# Patient Record
Sex: Female | Born: 1935
Health system: Southern US, Community
[De-identification: ages and names within clinical notes are randomized; demographics above are authoritative.]

## PROBLEM LIST (undated history)

## (undated) DIAGNOSIS — M1990 Unspecified osteoarthritis, unspecified site: Secondary | ICD-10-CM

## (undated) DIAGNOSIS — N8110 Cystocele, unspecified: Secondary | ICD-10-CM

## (undated) DIAGNOSIS — R911 Solitary pulmonary nodule: Secondary | ICD-10-CM

## (undated) DIAGNOSIS — M810 Age-related osteoporosis without current pathological fracture: Secondary | ICD-10-CM

## (undated) DIAGNOSIS — G609 Hereditary and idiopathic neuropathy, unspecified: Secondary | ICD-10-CM

## (undated) DIAGNOSIS — R159 Full incontinence of feces: Secondary | ICD-10-CM

## (undated) DIAGNOSIS — I10 Essential (primary) hypertension: Secondary | ICD-10-CM

## (undated) DIAGNOSIS — I341 Nonrheumatic mitral (valve) prolapse: Secondary | ICD-10-CM

## (undated) HISTORY — DX: Cystocele, unspecified: N81.10

## (undated) HISTORY — DX: Age-related osteoporosis without current pathological fracture: M81.0

## (undated) HISTORY — DX: Hereditary and idiopathic neuropathy, unspecified: G60.9

## (undated) HISTORY — DX: Solitary pulmonary nodule: R91.1

## (undated) HISTORY — PX: APPENDECTOMY: SHX54

## (undated) HISTORY — PX: CHOLECYSTECTOMY: SHX55

## (undated) HISTORY — DX: Full incontinence of feces: R15.9

## (undated) HISTORY — PX: ABDOMINAL HYSTERECTOMY: SHX81

## (undated) HISTORY — PX: HERNIA REPAIR: SHX51

---

## 1972-08-09 HISTORY — PX: ABDOMINAL HYSTERECTOMY: SHX81

## 1990-08-09 HISTORY — PX: VAGINAL PROLAPSE REPAIR: SHX830

## 1993-08-09 HISTORY — PX: VAGINAL PROLAPSE REPAIR: SHX830

## 1993-08-09 HISTORY — PX: CHOLECYSTECTOMY: SHX55

## 1994-08-09 HISTORY — PX: HERNIA REPAIR: SHX51

## 1998-07-15 ENCOUNTER — Encounter: Payer: Self-pay | Admitting: Orthopedic Surgery

## 1998-07-22 ENCOUNTER — Ambulatory Visit (HOSPITAL_COMMUNITY): Admission: RE | Admit: 1998-07-22 | Discharge: 1998-07-22 | Payer: Self-pay | Admitting: Orthopedic Surgery

## 1999-11-10 ENCOUNTER — Encounter: Admission: RE | Admit: 1999-11-10 | Discharge: 1999-11-10 | Payer: Self-pay | Admitting: Family Medicine

## 1999-11-10 ENCOUNTER — Encounter: Payer: Self-pay | Admitting: Family Medicine

## 1999-11-12 ENCOUNTER — Ambulatory Visit (HOSPITAL_COMMUNITY): Admission: RE | Admit: 1999-11-12 | Discharge: 1999-11-12 | Payer: Self-pay | Admitting: Family Medicine

## 2001-03-03 ENCOUNTER — Encounter: Admission: RE | Admit: 2001-03-03 | Discharge: 2001-03-03 | Payer: Self-pay | Admitting: Family Medicine

## 2001-03-03 ENCOUNTER — Encounter: Payer: Self-pay | Admitting: Family Medicine

## 2003-10-31 ENCOUNTER — Encounter: Admission: RE | Admit: 2003-10-31 | Discharge: 2003-10-31 | Payer: Self-pay | Admitting: Family Medicine

## 2006-02-21 ENCOUNTER — Ambulatory Visit: Payer: Self-pay | Admitting: Internal Medicine

## 2006-03-15 ENCOUNTER — Encounter: Payer: Self-pay | Admitting: Internal Medicine

## 2006-03-15 ENCOUNTER — Ambulatory Visit: Payer: Self-pay | Admitting: Internal Medicine

## 2006-08-18 ENCOUNTER — Encounter: Admission: RE | Admit: 2006-08-18 | Discharge: 2006-08-18 | Payer: Self-pay | Admitting: Orthopedic Surgery

## 2006-08-23 ENCOUNTER — Ambulatory Visit (HOSPITAL_BASED_OUTPATIENT_CLINIC_OR_DEPARTMENT_OTHER): Admission: RE | Admit: 2006-08-23 | Discharge: 2006-08-23 | Payer: Self-pay | Admitting: Orthopedic Surgery

## 2007-02-25 ENCOUNTER — Encounter: Admission: RE | Admit: 2007-02-25 | Discharge: 2007-02-25 | Payer: Self-pay | Admitting: Family Medicine

## 2008-06-12 ENCOUNTER — Emergency Department (HOSPITAL_COMMUNITY): Admission: EM | Admit: 2008-06-12 | Discharge: 2008-06-12 | Payer: Self-pay | Admitting: Emergency Medicine

## 2009-05-16 ENCOUNTER — Encounter: Admission: RE | Admit: 2009-05-16 | Discharge: 2009-05-16 | Payer: Self-pay | Admitting: Family Medicine

## 2010-03-24 ENCOUNTER — Encounter: Admission: RE | Admit: 2010-03-24 | Discharge: 2010-03-24 | Payer: Self-pay | Admitting: Gastroenterology

## 2010-12-25 NOTE — Op Note (Signed)
Sandra Mclaughlin, HENNER                  ACCOUNT NO.:  [id number]   MEDICAL RECORD NO.:  [id number]          PATIENT TYPE:  AMB   LOCATION:  DSC                          FACILITY:  [facility]   PHYSICIAN:  Leonides Grills, M.D.     DATE OF BIRTH:  1930/12/09   DATE OF PROCEDURE:  08/23/2006  DATE OF DISCHARGE:                               OPERATIVE REPORT   PREOPERATIVE DIAGNOSIS:  Right hallux valgus.   POSTOPERATIVE DIAGNOSIS:  Right hallux valgus.   OPERATIONS:  1. Right chevron bunionectomy.  2. Stress x-rays right foot.   ANESTHESIA:  General.   SURGEON:  Leonides Grills, MD   ASSISTANT:  Evlyn Kanner, P.A.   ESTIMATED BLOOD LOSS:  Minimal.   TOURNIQUET TIME:  Approximately 40 minutes.   COMPLICATIONS:  None.   DISPOSITION:  Stable to PR.   INDICATIONS:  This is a 77 year old female who has had longstanding  bunion pain that is interfering with her life to the point she cannot do  what she wants to do.  She has consented for the above procedure.  All  risks which infection neurovessel injury, nonunion, malunion, hardware  irritation, hardware failure, stiffness, arthritis, persistent or worse  pain, recurrence of deformity, hallux varus were all explained.  Questions were encouraged and answered.   OPERATION:  Patient brought to the operating room, placed in supine  position after adequate general endotracheal tube anesthesia was  administered as well as Ancef 1 gram IV piggyback.  The right lower  extremity was then prepped and draped in a sterile manner over  proximally placed thigh tourniquet.  The limb was gravity exsanguinated  and tourniquet was elevated to 290 mmHg.  A longitudinal incision over  the medial aspect great toe MTP joint was then made.  Dissection was  carried down through skin.  Hemostasis was obtained.  Neurovascular  structures were identified both superiorly and inferiorly and protected  throughout the case.  L-shaped capsulotomy was then made.   Simple  bunionectomy was performed with a sagittal saw.  Lateral capsule was  then released with a curved Beaver blade.  This had an excellent release  of tight lateral capsule.  Alona Bene ridge was then rounded off with a  rongeur.  Center head was then identified and Chevron osteotomy was  created with a sagittal saw.  Head was then translated approximately 3  or 4 mm laterally and fixed with 2.0 mm fully threaded cortical set  screw using 1.5-mm drill hole respectively.  This was countersunk.  This  had excellent purchase and maintenance of the correction.  The redundant  bone medially was then trimmed off with the sagittal saw.  Joint and  area were copiously irrigated with normal saline.  The capsule was  reconstructed and advanced both superiorly and proximally and  reconstructed with 2-0 Vicryl stitch.  This had an Conservation officer, historic buildings.  We were able to relocate the sesamoids beautifully.  Final stress x-rays  were obtained AP and lateral planes and showed sesamoids located,  fixation in proper position, no gross motion across the osteotomy site  and correction excellent as well.  Tourniquet deflated.  Hemostasis was  obtained.  The area was copiously irrigated with normal saline.  Skin  was closed with 4-0 nylon sutures.  Sterile dressing was applied.  Roger  Mann dressing was applied.  Hard sole shoe was applied.  The patient was  stable to PR.      Leonides Grills, M.D.  Electronically Signed     PB/MEDQ  D:  08/23/2006  T:  08/23/2006  Job:  [id number]

## 2011-05-11 LAB — URINALYSIS, ROUTINE W REFLEX MICROSCOPIC
Bilirubin Urine: NEGATIVE
Glucose, UA: NEGATIVE
Hgb urine dipstick: NEGATIVE
Ketones, ur: NEGATIVE
Nitrite: NEGATIVE
Protein, ur: NEGATIVE
Specific Gravity, Urine: 1.011
Urobilinogen, UA: 0.2
pH: 6.5

## 2011-05-11 LAB — DIFFERENTIAL
Basophils Absolute: 0
Basophils Relative: 0
Eosinophils Absolute: 0
Eosinophils Relative: 0
Lymphocytes Relative: 17
Lymphs Abs: 1.4
Monocytes Absolute: 0.4
Monocytes Relative: 5
Neutro Abs: 6.4
Neutrophils Relative %: 78 — ABNORMAL HIGH

## 2011-05-11 LAB — URINE MICROSCOPIC-ADD ON

## 2011-05-11 LAB — CBC
HCT: 40.6
Hemoglobin: 13.7
MCHC: 33.8
MCV: 91.4
Platelets: 302
RBC: 4.44
RDW: 13.6
WBC: 8.3

## 2011-05-11 LAB — POCT CARDIAC MARKERS
CKMB, poc: 1.2
Troponin i, poc: <0.05

## 2011-05-11 LAB — COMPREHENSIVE METABOLIC PANEL
AST: 18
Albumin: 3.9
Alkaline Phosphatase: 53
CO2: 24
GFR calc Af Amer: >60
Potassium: 3.3 — ABNORMAL LOW
Sodium: 138
Total Bilirubin: 0.8

## 2011-05-11 LAB — COMPREHENSIVE METABOLIC PANEL WITH GFR
ALT: 16
BUN: 10
Calcium: 8.9
Chloride: 106
Creatinine, Ser: 0.74
GFR calc non Af Amer: >60
Glucose, Bld: 112 — ABNORMAL HIGH
Total Protein: 6.9

## 2011-05-11 LAB — LIPASE, BLOOD: Lipase: 23

## 2011-05-11 LAB — D-DIMER, QUANTITATIVE: D-Dimer, Quant: 0.24

## 2011-11-22 ENCOUNTER — Other Ambulatory Visit: Payer: Self-pay | Admitting: Physician Assistant

## 2011-11-25 ENCOUNTER — Other Ambulatory Visit: Payer: Self-pay | Admitting: Family Medicine

## 2011-11-25 ENCOUNTER — Ambulatory Visit
Admission: RE | Admit: 2011-11-25 | Discharge: 2011-11-25 | Disposition: A | Discharge status: Home / Self Care | Payer: Medicare Other | Source: Ambulatory Visit | Attending: Family Medicine | Admitting: Family Medicine

## 2011-11-25 DIAGNOSIS — M79673 Pain in unspecified foot: Secondary | ICD-10-CM

## 2012-02-15 ENCOUNTER — Other Ambulatory Visit: Payer: Self-pay | Admitting: Family Medicine

## 2012-04-28 ENCOUNTER — Other Ambulatory Visit: Payer: Self-pay | Admitting: Oncology

## 2013-03-16 ENCOUNTER — Other Ambulatory Visit: Payer: Self-pay | Admitting: Family Medicine

## 2013-03-16 DIAGNOSIS — R109 Unspecified abdominal pain: Secondary | ICD-10-CM

## 2013-03-19 ENCOUNTER — Ambulatory Visit
Admission: RE | Admit: 2013-03-19 | Discharge: 2013-03-19 | Disposition: A | Discharge status: Home / Self Care | Payer: Medicare Other | Source: Ambulatory Visit | Attending: Family Medicine | Admitting: Family Medicine

## 2013-03-19 DIAGNOSIS — R109 Unspecified abdominal pain: Secondary | ICD-10-CM

## 2013-03-19 MED ORDER — IOHEXOL 300 MG/ML  SOLN
100.0000 mL | Freq: Once | INTRAMUSCULAR | Status: AC | PRN
  Administered 2013-03-19: 100 mL via INTRAVENOUS

## 2013-03-23 ENCOUNTER — Other Ambulatory Visit: Payer: Self-pay | Admitting: Gastroenterology

## 2013-07-23 ENCOUNTER — Other Ambulatory Visit: Payer: Self-pay | Admitting: Gastroenterology

## 2013-08-09 DIAGNOSIS — Z9889 Other specified postprocedural states: Secondary | ICD-10-CM

## 2013-08-09 HISTORY — DX: Other specified postprocedural states: Z98.890

## 2014-08-20 ENCOUNTER — Other Ambulatory Visit: Payer: Self-pay | Admitting: Family Medicine

## 2014-08-20 DIAGNOSIS — R1032 Left lower quadrant pain: Secondary | ICD-10-CM

## 2014-08-22 ENCOUNTER — Ambulatory Visit
Admission: RE | Admit: 2014-08-22 | Discharge: 2014-08-22 | Disposition: A | Discharge status: Home / Self Care | Payer: Medicare Other | Source: Ambulatory Visit | Attending: Family Medicine | Admitting: Family Medicine

## 2014-08-22 DIAGNOSIS — R1032 Left lower quadrant pain: Secondary | ICD-10-CM

## 2014-08-22 MED ORDER — IOHEXOL 300 MG/ML  SOLN
100.0000 mL | Freq: Once | INTRAMUSCULAR | Status: AC | PRN
  Administered 2014-08-22: 100 mL via INTRAVENOUS

## 2014-09-02 ENCOUNTER — Encounter (HOSPITAL_COMMUNITY): Payer: Self-pay | Admitting: Emergency Medicine

## 2014-09-02 ENCOUNTER — Emergency Department (HOSPITAL_COMMUNITY)
Admission: EM | Admit: 2014-09-02 | Discharge: 2014-09-02 | Disposition: A | Discharge status: Home / Self Care | Payer: Medicare Other | Attending: Emergency Medicine | Admitting: Emergency Medicine

## 2014-09-02 ENCOUNTER — Emergency Department (HOSPITAL_COMMUNITY): Payer: Medicare Other

## 2014-09-02 DIAGNOSIS — N390 Urinary tract infection, site not specified: Secondary | ICD-10-CM | POA: Diagnosis not present

## 2014-09-02 DIAGNOSIS — Z79899 Other long term (current) drug therapy: Secondary | ICD-10-CM | POA: Diagnosis not present

## 2014-09-02 DIAGNOSIS — M549 Dorsalgia, unspecified: Secondary | ICD-10-CM

## 2014-09-02 DIAGNOSIS — M545 Low back pain: Secondary | ICD-10-CM | POA: Diagnosis present

## 2014-09-02 DIAGNOSIS — Z792 Long term (current) use of antibiotics: Secondary | ICD-10-CM | POA: Diagnosis not present

## 2014-09-02 DIAGNOSIS — R52 Pain, unspecified: Secondary | ICD-10-CM

## 2014-09-02 LAB — CBC WITH DIFFERENTIAL/PLATELET
BASOS PCT: 0 % (ref 0–1)
Basophils Absolute: 0 10*3/uL (ref 0.0–0.1)
EOS ABS: 0 10*3/uL (ref 0.0–0.7)
Eosinophils Relative: 0 % (ref 0–5)
HCT: 40.1 % (ref 36.0–46.0)
Hemoglobin: 13.4 g/dL (ref 12.0–15.0)
LYMPHS PCT: 8 % — AB (ref 12–46)
Lymphs Abs: 1.2 10*3/uL (ref 0.7–4.0)
MCH: 30.5 pg (ref 26.0–34.0)
MCHC: 33.4 g/dL (ref 30.0–36.0)
MCV: 91.3 fL (ref 78.0–100.0)
Monocytes Absolute: 1.2 10*3/uL — ABNORMAL HIGH (ref 0.1–1.0)
Monocytes Relative: 7 % (ref 3–12)
NEUTROS ABS: 13.8 10*3/uL — AB (ref 1.7–7.7)
Neutrophils Relative %: 85 % — ABNORMAL HIGH (ref 43–77)
Platelets: 244 10*3/uL (ref 150–400)
RBC: 4.39 MIL/uL (ref 3.87–5.11)
RDW: 13.6 % (ref 11.5–15.5)
WBC: 16.3 10*3/uL — AB (ref 4.0–10.5)

## 2014-09-02 LAB — COMPREHENSIVE METABOLIC PANEL
ALT: 24 U/L (ref 0–35)
ANION GAP: 9 (ref 5–15)
AST: 27 U/L (ref 0–37)
Albumin: 3.8 g/dL (ref 3.5–5.2)
Alkaline Phosphatase: 48 U/L (ref 39–117)
BILIRUBIN TOTAL: 0.7 mg/dL (ref 0.3–1.2)
BUN: 12 mg/dL (ref 6–23)
CALCIUM: 8.7 mg/dL (ref 8.4–10.5)
CHLORIDE: 106 mmol/L (ref 96–112)
CO2: 24 mmol/L (ref 19–32)
CREATININE: 0.69 mg/dL (ref 0.50–1.10)
GFR calc Af Amer: >90 mL/min (ref >90)
GFR calc non Af Amer: 79 mL/min — ABNORMAL LOW (ref >90)
Glucose, Bld: 133 mg/dL — ABNORMAL HIGH (ref 70–99)
POTASSIUM: 3.3 mmol/L — AB (ref 3.5–5.1)
SODIUM: 139 mmol/L (ref 135–145)
Total Protein: 6.9 g/dL (ref 6.0–8.3)

## 2014-09-02 LAB — URINE MICROSCOPIC-ADD ON

## 2014-09-02 LAB — URINALYSIS, ROUTINE W REFLEX MICROSCOPIC
Bilirubin Urine: NEGATIVE
GLUCOSE, UA: NEGATIVE mg/dL
HGB URINE DIPSTICK: NEGATIVE
Ketones, ur: NEGATIVE mg/dL
NITRITE: NEGATIVE
PROTEIN: NEGATIVE mg/dL
Specific Gravity, Urine: 1.016 (ref 1.005–1.030)
UROBILINOGEN UA: 0.2 mg/dL (ref 0.0–1.0)
pH: 6 (ref 5.0–8.0)

## 2014-09-02 LAB — LIPASE, BLOOD: Lipase: 43 U/L (ref 11–59)

## 2014-09-02 MED ORDER — CEFUROXIME AXETIL 250 MG PO TABS: 250.0000 mg | ORAL_TABLET | Freq: Two times a day (BID) | ORAL | Status: DC

## 2014-09-02 MED ORDER — MORPHINE SULFATE 4 MG/ML IJ SOLN
4.0000 mg | Freq: Once | INTRAMUSCULAR | Status: AC
  Administered 2014-09-02: 4 mg via INTRAVENOUS
  Filled 2014-09-02: qty 1

## 2014-09-02 MED ORDER — HYDROCODONE-ACETAMINOPHEN 5-325 MG PO TABS: 1.0000 | ORAL_TABLET | ORAL | Status: DC | PRN

## 2014-09-02 MED ORDER — SODIUM CHLORIDE 0.9 % IV SOLN
1000.0000 mL | INTRAVENOUS | Status: DC
  Administered 2014-09-02: 1000 mL via INTRAVENOUS

## 2014-09-02 MED ORDER — DEXTROSE 5 % IV SOLN
1.0000 g | Freq: Once | INTRAVENOUS | Status: AC
  Administered 2014-09-02: 1 g via INTRAVENOUS
  Filled 2014-09-02: qty 10

## 2014-09-02 MED ORDER — ONDANSETRON HCL 4 MG/2ML IJ SOLN
4.0000 mg | Freq: Once | INTRAMUSCULAR | Status: AC
  Administered 2014-09-02: 4 mg via INTRAVENOUS
  Filled 2014-09-02: qty 2

## 2014-09-02 NOTE — ED Provider Notes (Signed)
CSN: [id number]     Arrival date & time 09/02/14  1219 History   First MD Initiated Contact with Patient 09/02/14 1221     Chief Complaint  Patient presents with  . Back Pain  . right side pain     HPI Comments: She woke up yesterday with pain in her right back.  It seems to be moving towards the right lower back and right hip.  Patient is a 81 y.o. female presenting with back pain. The history is provided by the patient.  Back Pain Location:  Lumbar spine Quality:  Stabbing Pain severity:  Severe Duration:  1 day Timing:  Constant Progression:  Worsening Chronicity:  New Context: not recent illness and not recent injury   Worsened by:  Twisting, ambulation and movement Associated symptoms: no abdominal pain, no chest pain, no dysuria, no fever and no numbness   SHe was treated for a uti about a week ago.  That seems to have improved.  No history of prior back troubles.  No numbness or weakness in her legs  History reviewed. No pertinent past medical history. Past Surgical History  Procedure Laterality Date  . Cholecystectomy    . Appendectomy    . Abdominal hysterectomy     No family history on file. History  Substance Use Topics  . Smoking status: Never Smoker   . Smokeless tobacco: Not on file  . Alcohol Use: No   OB History    No data available     Review of Systems  Constitutional: Negative for fever.  Cardiovascular: Negative for chest pain.  Gastrointestinal: Negative for abdominal pain.  Genitourinary: Negative for dysuria.  Musculoskeletal: Positive for back pain.  Neurological: Negative for numbness.  All other systems reviewed and are negative.     Allergies  Review of patient's allergies indicates no known allergies.  Home Medications   Prior to Admission medications   Medication Sig Start Date End Date Taking? Authorizing Provider  alendronate (FOSAMAX) 70 MG tablet Take 70 mg by mouth once a week. tuesday 06/13/14  Yes Historical Provider, MD    glucosamine-chondroitin 500-400 MG tablet Take 1 tablet by mouth 2 (two) times daily.   Yes Historical Provider, MD  hydrochlorothiazide (HYDRODIURIL) 25 MG tablet Take 25 mg by mouth daily. 06/13/14  Yes Historical Provider, MD  hyoscyamine (LEVSIN, ANASPAZ) 0.125 MG tablet Take 1 tablet by mouth every 4 (four) hours as needed. Abdominal pain 08/15/14  Yes Historical Provider, MD  KLOR-CON M10 10 MEQ tablet Take 1 tablet by mouth daily. 06/13/14  Yes Historical Provider, MD  Multiple Vitamins-Minerals (CENTRUM SILVER PO) Take 1 tablet by mouth daily.   Yes Historical Provider, MD  timolol (TIMOPTIC-XR) 0.5 % ophthalmic gel-forming Place 1 drop into both eyes every morning. 08/20/14  Yes Historical Provider, MD  cefUROXime (CEFTIN) 250 MG tablet Take 1 tablet (250 mg total) by mouth 2 (two) times daily with a meal. 09/02/14   Linwood Dibbles, MD  ciprofloxacin (CIPRO) 500 MG tablet Take 1 tablet by mouth 2 (two) times daily. 08/20/14   Historical Provider, MD  HYDROcodone-acetaminophen (NORCO/VICODIN) 5-325 MG per tablet Take 1-2 tablets by mouth every 4 (four) hours as needed. 09/02/14   Linwood Dibbles, MD   BP 124/87 mmHg  Pulse 102  Temp(Src) 98.2 F (36.8 C) (Oral)  Resp 20  Ht  (1.575 m)  Wt 134 lb (60.782 kg)  BMI 24.50 kg/m2  SpO2 96% Physical Exam  Constitutional: She appears well-developed and well-nourished. No  distress.  HENT:  Head: Normocephalic and atraumatic.  Right Ear: External ear normal.  Left Ear: External ear normal.  Eyes: Conjunctivae are normal. Right eye exhibits no discharge. Left eye exhibits no discharge. No scleral icterus.  Neck: Neck supple. No tracheal deviation present.  Cardiovascular: Normal rate, regular rhythm and intact distal pulses.   Pulmonary/Chest: Effort normal and breath sounds normal. No stridor. No respiratory distress. She has no wheezes. She has no rales.  Abdominal: Soft. Bowel sounds are normal. She exhibits no distension. There is no tenderness.  There is no rebound and no guarding.  Musculoskeletal: She exhibits no edema.       Lumbar back: She exhibits tenderness.       Back:  Neurological: She is alert. She has normal strength. No cranial nerve deficit (no facial droop, extraocular movements intact, no slurred speech) or sensory deficit. She exhibits normal muscle tone. She displays no seizure activity. Coordination normal.  Skin: Skin is warm and dry. No rash noted.  Psychiatric: She has a normal mood and affect.  Nursing note and vitals reviewed.   ED Course  Procedures (including critical care time) Labs Review Labs Reviewed  CBC WITH DIFFERENTIAL/PLATELET - Abnormal; Notable for the following:    WBC 16.3 (*)    Neutrophils Relative % 85 (*)    Neutro Abs 13.8 (*)    Lymphocytes Relative 8 (*)    Monocytes Absolute 1.2 (*)    All other components within normal limits  COMPREHENSIVE METABOLIC PANEL - Abnormal; Notable for the following:    Potassium 3.3 (*)    Glucose, Bld 133 (*)    GFR calc non Af Amer 79 (*)    All other components within normal limits  URINALYSIS, ROUTINE W REFLEX MICROSCOPIC - Abnormal; Notable for the following:    APPearance CLOUDY (*)    Leukocytes, UA LARGE (*)    All other components within normal limits  URINE MICROSCOPIC-ADD ON - Abnormal; Notable for the following:    Bacteria, UA MANY (*)    All other components within normal limits  URINE CULTURE  LIPASE, BLOOD    Imaging Review Dg Chest 2 View  09/02/2014   CLINICAL DATA:  RIGHT-sided chest pain with inspiration. Symptom onset yesterday. Initial encounter.  EXAM: CHEST  2 VIEW  COMPARISON:  08/18/2006 chest radiograph.  FINDINGS: Chronic bronchitic changes are present at the RIGHT lung base. Aortic arch atherosclerosis. Cholecystectomy clips are present in the right upper quadrant.  The cardiopericardial silhouette is within normal limits. Thoracic kyphosis and thoracic spondylosis.  IMPRESSION: No active cardiopulmonary disease.    Electronically Signed   By: Andreas NewportGeoffrey  Lamke M.D.   On: 09/02/2014 14:05   Dg Lumbar Spine Complete  09/02/2014   CLINICAL DATA:  Right-sided low back pain on awakening today. No acute injury. Initial encounter.  EXAM: LUMBAR SPINE - COMPLETE 4+ VIEW  COMPARISON:  Abdominal pelvic CT 08/22/2014. Abdominal radiographs 10/26/2010.  FINDINGS: There are 5 lumbar type vertebral bodies. There is a stable mild convex right scoliosis. No fracture or pars defect is evident. The lumbar disc spaces are relatively preserved. There is multilevel facet disease. Scattered vascular calcifications/ phleboliths again noted.  IMPRESSION: Stable lumbar spondylosis and convex right scoliosis. No acute osseous findings.   Electronically Signed   By: Roxy HorsemanBill  Veazey M.D.   On: 09/02/2014 14:05    Medications  0.9 %  sodium chloride infusion (1,000 mLs Intravenous New Bag/Given 09/02/14 1317)  ondansetron (ZOFRAN) injection 4 mg (4  mg Intravenous Given 09/02/14 1317)  morphine 4 MG/ML injection 4 mg (4 mg Intravenous Given 09/02/14 1317)  cefTRIAXone (ROCEPHIN) 1 g in dextrose 5 % 50 mL IVPB (1 g Intravenous New Bag/Given 09/02/14 1449)  morphine 4 MG/ML injection 4 mg (4 mg Intravenous Given 09/02/14 1449)     MDM   Final diagnoses:  Pain  UTI (lower urinary tract infection)  Back pain, unspecified location    Pt does have a uti.  This could be causing some of her pain but a lot of it is associated with movement and is positional.  May be musculoskeletal as well.  No abdominal ttp.  Doubt AAA.  Dc home with pain med and abx.  Follow up with her PCP   Linwood Dibbles, MD 09/02/14 [phone]

## 2014-09-02 NOTE — Discharge Instructions (Signed)
Back Pain, Adult °Low back pain is very common. About 1 in 5 people have back pain. The cause of low back pain is rarely dangerous. The pain often gets better over time. About half of people with a sudden onset of back pain feel better in just 2 weeks. About 8 in 10 people feel better by 6 weeks.  °CAUSES °Some common causes of back pain include: °· Strain of the muscles or ligaments supporting the spine. °· Wear and tear (degeneration) of the spinal discs. °· Arthritis. °· Direct injury to the back. °DIAGNOSIS °Most of the time, the direct cause of low back pain is not known. However, back pain can be treated effectively even when the exact cause of the pain is unknown. Answering your caregiver's questions about your overall health and symptoms is one of the most accurate ways to make sure the cause of your pain is not dangerous. If your caregiver needs more information, he or she may order lab work or imaging tests (X-rays or MRIs). However, even if imaging tests show changes in your back, this usually does not require surgery. °HOME CARE INSTRUCTIONS °For many people, back pain returns. Since low back pain is rarely dangerous, it is often a condition that people can learn to manage on their own.  °· Remain active. It is stressful on the back to sit or stand in one place. Do not sit, drive, or stand in one place for more than 30 minutes at a time. Take short walks on level surfaces as soon as pain allows. Try to increase the length of time you walk each day. °· Do not stay in bed. Resting more than 1 or 2 days can delay your recovery. °· Do not avoid exercise or work. Your body is made to move. It is not dangerous to be active, even though your back may hurt. Your back will likely heal faster if you return to being active before your pain is gone. °· Pay attention to your body when you  bend and lift. Many people have less discomfort when lifting if they bend their knees, keep the load close to their bodies, and  avoid twisting. Often, the most comfortable positions are those that put less stress on your recovering back. °· Find a comfortable position to sleep. Use a firm mattress and lie on your side with your knees slightly bent. If you lie on your back, put a pillow under your knees. °· Only take over-the-counter or prescription medicines as directed by your caregiver. Over-the-counter medicines to reduce pain and inflammation are often the most helpful. Your caregiver may prescribe muscle relaxant drugs. These medicines help dull your pain so you can more quickly return to your normal activities and healthy exercise. °· Put ice on the injured area. °· Put ice in a plastic bag. °· Place a towel between your skin and the bag. °· Leave the ice on for 15-20 minutes, 03-04 times a day for the first 2 to 3 days. After that, ice and heat may be alternated to reduce pain and spasms. °· Ask your caregiver about trying back exercises and gentle massage. This may be of some benefit. °· Avoid feeling anxious or stressed. Stress increases muscle tension and can worsen back pain. It is important to recognize when you are anxious or stressed and learn ways to manage it. Exercise is a great option. °SEEK MEDICAL CARE IF: °· You have pain that is not relieved with rest or medicine. °· You have pain that does not improve in 1 week. °· You have new symptoms. °· You are generally not feeling well. °SEEK   IMMEDIATE MEDICAL CARE IF:  °· You have pain that radiates from your back into your legs. °· You develop new bowel or bladder control problems. °· You have unusual weakness or numbness in your arms or legs. °· You develop nausea or vomiting. °· You develop abdominal pain. °· You feel faint. °Document Released: 07/26/2005 Document Revised: 01/25/2012 Document Reviewed: 11/27/2013 °ExitCare® Patient Information ©2015 [employer]. This information is not intended to replace advice given to you by your health care provider. Make sure you  discuss any questions you have with your health care provider. ° °Urinary Tract Infection °Urinary tract infections (UTIs) can develop anywhere along your urinary tract. Your urinary tract is your body's drainage system for removing wastes and extra water. Your urinary tract includes two kidneys, two ureters, a bladder, and a urethra. Your kidneys are a pair of bean-shaped organs. Each kidney is about the size of your fist. They are located below your ribs, one on each side of your spine. °CAUSES °Infections are caused by microbes, which are microscopic organisms, including fungi, viruses, and bacteria. These organisms are so small that they can only be seen through a microscope. Bacteria are the microbes that most commonly cause UTIs. °SYMPTOMS  °Symptoms of UTIs may vary by age and gender of the patient and by the location of the infection. Symptoms in young women typically include a frequent and intense urge to urinate and a painful, burning feeling in the bladder or urethra during urination. Older women and men are more likely to be tired, shaky, and weak and have muscle aches and abdominal pain. A fever may mean the infection is in your kidneys. Other symptoms of a kidney infection include pain in your back or sides below the ribs, nausea, and vomiting. °DIAGNOSIS °To diagnose a UTI, your caregiver will ask you about your symptoms. Your caregiver also will ask to provide a urine sample. The urine sample will be tested for bacteria and white blood cells. White blood cells are made by your body to help fight infection. °TREATMENT  °Typically, UTIs can be treated with medication. Because most UTIs are caused by a bacterial infection, they usually can be treated with the use of antibiotics. The choice of antibiotic and length of treatment depend on your symptoms and the type of bacteria causing your infection. °HOME CARE INSTRUCTIONS °· If you were prescribed antibiotics, take them exactly as your caregiver  instructs you. Finish the medication even if you feel better after you have only taken some of the medication. °· Drink enough water and fluids to keep your urine clear or pale yellow. °· Avoid caffeine, tea, and carbonated beverages. They tend to irritate your bladder. °· Empty your bladder often. Avoid holding urine for long periods of time. °· Empty your bladder before and after sexual intercourse. °· After a bowel movement, women should cleanse from front to back. Use each tissue only once. °SEEK MEDICAL CARE IF:  °· You have back pain. °· You develop a fever. °· Your symptoms do not begin to resolve within 3 days. °SEEK IMMEDIATE MEDICAL CARE IF:  °· You have severe back pain or lower abdominal pain. °· You develop chills. °· You have nausea or vomiting. °· You have continued burning or discomfort with urination. °MAKE SURE YOU:  °· Understand these instructions. °· Will watch your condition. °· Will get help right away if you are not doing well or get worse. °Document Released: 05/05/2005 Document Revised: 01/25/2012 Document Reviewed: 09/03/2011 °ExitCare® Patient Information ©  2015 [employer]. This information is not intended to replace advice given to you by your health care provider. Make sure you discuss any questions you have with your health care provider. ° °

## 2014-09-02 NOTE — ED Notes (Signed)
Pt states yesterday when she woke up she felt as if she had slept on right side wrong, woke up with pain on right side espeically with dee[p breathe, pt states this morning she woke up and the pain radiated across back.

## 2014-09-02 NOTE — ED Notes (Signed)
Bed: WA06 Expected date:  Expected time:  Means of arrival:  Comments: EMS/seizure 

## 2014-09-03 LAB — URINE CULTURE
Colony Count: NO GROWTH
Culture: NO GROWTH

## 2015-01-01 ENCOUNTER — Other Ambulatory Visit (HOSPITAL_COMMUNITY): Payer: Self-pay | Admitting: Gastroenterology

## 2015-01-01 DIAGNOSIS — R1013 Epigastric pain: Secondary | ICD-10-CM

## 2015-01-15 ENCOUNTER — Ambulatory Visit (HOSPITAL_COMMUNITY)
Admission: RE | Admit: 2015-01-15 | Discharge: 2015-01-15 | Disposition: A | Discharge status: Home / Self Care | Payer: Medicare Other | Source: Ambulatory Visit | Attending: Gastroenterology | Admitting: Gastroenterology

## 2015-01-15 DIAGNOSIS — R110 Nausea: Secondary | ICD-10-CM | POA: Insufficient documentation

## 2015-01-15 DIAGNOSIS — R1013 Epigastric pain: Secondary | ICD-10-CM | POA: Diagnosis not present

## 2015-01-15 MED ORDER — TECHNETIUM TC 99M SULFUR COLLOID
2.0000 | Freq: Once | INTRAVENOUS | Status: AC | PRN
  Administered 2015-01-15: 2 via INTRAVENOUS

## 2015-08-10 HISTORY — PX: SPINAL CORD STIMULATOR IMPLANT: SHX2422

## 2015-08-16 ENCOUNTER — Emergency Department (HOSPITAL_COMMUNITY)
Admission: EM | Admit: 2015-08-16 | Discharge: 2015-08-16 | Disposition: A | Discharge status: Home / Self Care | Payer: Medicare Other | Attending: Emergency Medicine | Admitting: Emergency Medicine

## 2015-08-16 ENCOUNTER — Encounter (HOSPITAL_COMMUNITY): Payer: Self-pay | Admitting: Emergency Medicine

## 2015-08-16 ENCOUNTER — Emergency Department (HOSPITAL_COMMUNITY): Payer: Medicare Other

## 2015-08-16 DIAGNOSIS — Z792 Long term (current) use of antibiotics: Secondary | ICD-10-CM | POA: Insufficient documentation

## 2015-08-16 DIAGNOSIS — G8929 Other chronic pain: Secondary | ICD-10-CM | POA: Insufficient documentation

## 2015-08-16 DIAGNOSIS — R110 Nausea: Secondary | ICD-10-CM | POA: Insufficient documentation

## 2015-08-16 DIAGNOSIS — R1013 Epigastric pain: Secondary | ICD-10-CM | POA: Diagnosis not present

## 2015-08-16 DIAGNOSIS — R1033 Periumbilical pain: Secondary | ICD-10-CM | POA: Insufficient documentation

## 2015-08-16 DIAGNOSIS — R109 Unspecified abdominal pain: Secondary | ICD-10-CM

## 2015-08-16 DIAGNOSIS — Z79899 Other long term (current) drug therapy: Secondary | ICD-10-CM | POA: Insufficient documentation

## 2015-08-16 LAB — COMPREHENSIVE METABOLIC PANEL
ALBUMIN: 3.1 g/dL — AB (ref 3.5–5.0)
ALT: 37 U/L (ref 14–54)
AST: 23 U/L (ref 15–41)
Alkaline Phosphatase: 60 U/L (ref 38–126)
Anion gap: 12 (ref 5–15)
BUN: 13 mg/dL (ref 6–20)
CALCIUM: 9 mg/dL (ref 8.9–10.3)
CO2: 22 mmol/L (ref 22–32)
CREATININE: 1.12 mg/dL — AB (ref 0.44–1.00)
Chloride: 97 mmol/L — ABNORMAL LOW (ref 101–111)
GFR calc Af Amer: 52 mL/min — ABNORMAL LOW (ref >60)
GFR calc non Af Amer: 44 mL/min — ABNORMAL LOW (ref >60)
GLUCOSE: 109 mg/dL — AB (ref 65–99)
Potassium: 3.4 mmol/L — ABNORMAL LOW (ref 3.5–5.1)
SODIUM: 131 mmol/L — AB (ref 135–145)
Total Bilirubin: <0.1 mg/dL — ABNORMAL LOW (ref 0.3–1.2)
Total Protein: 6.8 g/dL (ref 6.5–8.1)

## 2015-08-16 LAB — CBC WITH DIFFERENTIAL/PLATELET
Basophils Absolute: 0 10*3/uL (ref 0.0–0.1)
Basophils Relative: 0 %
EOS ABS: 0 10*3/uL (ref 0.0–0.7)
Eosinophils Relative: 1 %
HCT: 37.8 % (ref 36.0–46.0)
HEMOGLOBIN: 12.7 g/dL (ref 12.0–15.0)
LYMPHS ABS: 1.3 10*3/uL (ref 0.7–4.0)
Lymphocytes Relative: 18 %
MCH: 29.8 pg (ref 26.0–34.0)
MCHC: 33.6 g/dL (ref 30.0–36.0)
MCV: 88.7 fL (ref 78.0–100.0)
Monocytes Absolute: 0.7 10*3/uL (ref 0.1–1.0)
Monocytes Relative: 10 %
NEUTROS PCT: 71 %
Neutro Abs: 5.3 10*3/uL (ref 1.7–7.7)
Platelets: 405 10*3/uL — ABNORMAL HIGH (ref 150–400)
RBC: 4.26 MIL/uL (ref 3.87–5.11)
RDW: 12.7 % (ref 11.5–15.5)
WBC: 7.3 10*3/uL (ref 4.0–10.5)

## 2015-08-16 LAB — URINALYSIS, ROUTINE W REFLEX MICROSCOPIC
BILIRUBIN URINE: NEGATIVE
GLUCOSE, UA: NEGATIVE mg/dL
Hgb urine dipstick: NEGATIVE
KETONES UR: 15 mg/dL — AB
Nitrite: NEGATIVE
PH: 6 (ref 5.0–8.0)
Protein, ur: NEGATIVE mg/dL
Specific Gravity, Urine: 1.019 (ref 1.005–1.030)

## 2015-08-16 LAB — URINE MICROSCOPIC-ADD ON: Bacteria, UA: NONE SEEN

## 2015-08-16 LAB — LIPASE, BLOOD: Lipase: 28 U/L (ref 11–51)

## 2015-08-16 MED ORDER — ONDANSETRON HCL 4 MG/2ML IJ SOLN
4.0000 mg | Freq: Once | INTRAMUSCULAR | Status: AC
  Administered 2015-08-16: 4 mg via INTRAVENOUS
  Filled 2015-08-16: qty 2

## 2015-08-16 MED ORDER — OXYCODONE-ACETAMINOPHEN 5-325 MG PO TABS: 1.0000 | ORAL_TABLET | ORAL | Status: DC | PRN

## 2015-08-16 MED ORDER — ONDANSETRON 4 MG PO TBDP: 4.0000 mg | ORAL_TABLET | Freq: Three times a day (TID) | ORAL | Status: DC | PRN

## 2015-08-16 MED ORDER — IOHEXOL 300 MG/ML  SOLN
80.0000 mL | Freq: Once | INTRAMUSCULAR | Status: AC | PRN
  Administered 2015-08-16: 80 mL via INTRAVENOUS

## 2015-08-16 MED ORDER — SODIUM CHLORIDE 0.9 % IV SOLN
INTRAVENOUS | Status: DC
  Administered 2015-08-16: 12:00:00 via INTRAVENOUS

## 2015-08-16 MED ORDER — MORPHINE SULFATE (PF) 4 MG/ML IV SOLN
4.0000 mg | Freq: Once | INTRAVENOUS | Status: AC
  Administered 2015-08-16: 4 mg via INTRAVENOUS
  Filled 2015-08-16: qty 1

## 2015-08-16 NOTE — ED Provider Notes (Signed)
CSN: [id number]     Arrival date & time 08/16/15  0941 History   First MD Initiated Contact with Patient 08/16/15 [phone]     Chief Complaint  Patient presents with  . Abdominal Pain     (Consider location/radiation/quality/duration/timing/severity/associated sxs/prior Treatment) Patient is a 82 y.o. female presenting with abdominal pain. The history is provided by the patient and medical records.  Abdominal Pain Associated symptoms: nausea    82 year old female here with abdominal pain. Patient has a long-standing history of abdominal pain for the past several years. She has been seen by GI specialist at [facility] as well as local GI physician, Dr. Ewing Schlein, and had extensive workup including colonoscopy, EGD, flexible sigmoidoscopy, and CT scan. No etiology of her pain has been identified. She has also been seeing a pain specialist at [facility] and receiving injections for her pain as well. She states for the past 2 days her pain has been worse than normal. Pain continues to be localized to her periumbilical and epigastric region. She states pain is constant, cannot specify whether it is dull, sharp, or throbbing. She endorses nausea but denies vomiting. She has had occasional loose stools but denies frank diarrhea. No melena or hematochezia. Patient has only been able to eat once yesterday due to the pain. She states her pain is not exacerbated with eating, however she feels that she cannot eat. She has been drinking fluids. Patient takes tramadol for pain on a regular basis but states it is not currently helping her pain.  Son reports patient was seen approx 1 week ago at urgent care in [place] and diagnosed with UTI.  She was started on ciprofloxacin and has completed course at this time.  Patient currently denies urinary symptoms.  No fever, chills, sweats.  Patient is s/p hysterectomy, appendectomy, cholecystectomy, and umbilical hernia repair.  History reviewed. No pertinent past medical  history. Past Surgical History  Procedure Laterality Date  . Cholecystectomy    . Appendectomy    . Abdominal hysterectomy     History reviewed. No pertinent family history. Social History  Substance Use Topics  . Smoking status: Never Smoker   . Smokeless tobacco: None  . Alcohol Use: No   OB History    No data available     Review of Systems  Gastrointestinal: Positive for nausea and abdominal pain.  All other systems reviewed and are negative.     Allergies  Review of patient's allergies indicates no known allergies.  Home Medications   Prior to Admission medications   Medication Sig Start Date End Date Taking? Authorizing Provider  alendronate (FOSAMAX) 70 MG tablet Take 70 mg by mouth once a week. tuesday 06/13/14   Historical Provider, MD  cefUROXime (CEFTIN) 250 MG tablet Take 1 tablet (250 mg total) by mouth 2 (two) times daily with a meal. 09/02/14   Linwood Dibbles, MD  ciprofloxacin (CIPRO) 500 MG tablet Take 1 tablet by mouth 2 (two) times daily. 08/20/14   Historical Provider, MD  glucosamine-chondroitin 500-400 MG tablet Take 1 tablet by mouth 2 (two) times daily.    Historical Provider, MD  hydrochlorothiazide (HYDRODIURIL) 25 MG tablet Take 25 mg by mouth daily. 06/13/14   Historical Provider, MD  HYDROcodone-acetaminophen (NORCO/VICODIN) 5-325 MG per tablet Take 1-2 tablets by mouth every 4 (four) hours as needed. 09/02/14   Linwood Dibbles, MD  hyoscyamine (LEVSIN, ANASPAZ) 0.125 MG tablet Take 1 tablet by mouth every 4 (four) hours as needed. Abdominal pain 08/15/14   Historical  Provider, MD  KLOR-CON M10 10 MEQ tablet Take 1 tablet by mouth daily. 06/13/14   Historical Provider, MD  Multiple Vitamins-Minerals (CENTRUM SILVER PO) Take 1 tablet by mouth daily.    Historical Provider, MD  timolol (TIMOPTIC-XR) 0.5 % ophthalmic gel-forming Place 1 drop into both eyes every morning. 08/20/14   Historical Provider, MD   BP 104/86 mmHg  Pulse 81  Temp(Src) 98.1 F (36.7 C)  (Oral)  Resp 24  Ht 5\' 2"  (1.575 m)  Wt 61.236 kg  BMI 24.69 kg/m2  SpO2 97%   Physical Exam  Constitutional: She is oriented to person, place, and time. She appears well-developed and well-nourished. No distress.  HENT:  Head: Normocephalic and atraumatic.  Mouth/Throat: Oropharynx is clear and moist.  Eyes: Conjunctivae and EOM are normal. Pupils are equal, round, and reactive to light.  Neck: Normal range of motion. Neck supple.  Cardiovascular: Normal rate, regular rhythm and normal heart sounds.   Pulmonary/Chest: Effort normal and breath sounds normal. No respiratory distress. She has no wheezes.  Abdominal: Soft. Bowel sounds are normal. There is tenderness in the epigastric area and periumbilical area. There is no guarding and no CVA tenderness.  Abdomen soft, does not appear distended; normal bowel sounds; TTP in peri-umbilical and epigastric regions; no rebound or guarding  Musculoskeletal: Normal range of motion.  Neurological: She is alert and oriented to person, place, and time.  Skin: Skin is warm and dry. She is not diaphoretic.  Psychiatric: She has a normal mood and affect.  Nursing note and vitals reviewed.   ED Course  Procedures (including critical care time) Labs Review Labs Reviewed  CBC WITH DIFFERENTIAL/PLATELET - Abnormal; Notable for the following:    Platelets 405 (*)    All other components within normal limits  COMPREHENSIVE METABOLIC PANEL - Abnormal; Notable for the following:    Sodium 131 (*)    Potassium 3.4 (*)    Chloride 97 (*)    Glucose, Bld 109 (*)    Creatinine, Ser 1.12 (*)    Albumin 3.1 (*)    Total Bilirubin <0.1 (*)    GFR calc non Af Amer 44 (*)    GFR calc Af Amer 52 (*)    All other components within normal limits  URINALYSIS, ROUTINE W REFLEX MICROSCOPIC (NOT AT [facility]) - Abnormal; Notable for the following:    Ketones, ur 15 (*)    Leukocytes, UA MODERATE (*)    All other components within normal limits  URINE  MICROSCOPIC-ADD ON - Abnormal; Notable for the following:    Squamous Epithelial / LPF 0-5 (*)    All other components within normal limits  LIPASE, BLOOD    Imaging Review Ct Abdomen Pelvis W Contrast  08/16/2015  CLINICAL DATA:  Epigastric pain for 4 days with nausea. EXAM: CT ABDOMEN AND PELVIS WITH CONTRAST TECHNIQUE: Multidetector CT imaging of the abdomen and pelvis was performed using the standard protocol following bolus administration of intravenous contrast. CONTRAST:  80mL OMNIPAQUE IOHEXOL 300 MG/ML  SOLN COMPARISON:  08/22/2014 FINDINGS: Postcholecystectomy. Stable 11 mm complex cystic lesion at the dome of the liver on image 28. Intrahepatic biliary dilatation has progressed markedly since the prior study. Both studies were performed in the post cholecystectomy state. This raises the possibility of interval biliary obstruction. No obvious pancreatic head mass. Spleen, pancreas, adrenal glands are within normal limits Several pelvic cysts are seen in the kidneys. Bladder is distended.  No bladder mass. Sigmoid diverticulosis without acute diverticulitis.  Small hiatal hernia No free-fluid.  No abnormal retroperitoneal adenopathy. No vertebral compression deformity. Multilevel lumbar facet arthropathy. IMPRESSION: Since the prior study, biliary dilatation has developed. Both studies were performed in the postcholecystectomy state, raising the possibility of interval biliary obstruction. There is no obvious pancreatic head mass. Correlation with liver function tests is recommended as for the need for a ERCP or MRCP. Electronically Signed   By: Jolaine ClickArthur  Hoss M.D.   On: 08/16/2015 12:30   I have personally reviewed and evaluated these images and lab results as part of my medical decision-making.   EKG Interpretation None      MDM   Final diagnoses:  Abdominal pain, unspecified abdominal location   82 y.o. F here with abdominal pain.  Hx of chronic abdominal pain for the past several  years, worse over the past 2 days.  No N/V/D.  Patient afebrile, non-toxic.  VSS.  TTP in per-umbilical and epigastric regions.  No rebound or guarding. No peritonitis.  Lab work reassuring, no leukocytosis.  Mild elevation of SrCr at 1.12, baseline appears around 0.7.  CT scan reveals biliary dilatation. Question of interval biliary obstruction. Patient's liver enzymes, alkaline phosphatase, and bilirubin are all within normal limits. Do not feel emergent ERCP/MRCP indicated at this time, rather feel this can be evaluated as an OP-- patient and family are comfortable with this. Patient was treated with IVF, morphine, zofran and reports she now feels back to normal.  VS have remained stable.  Patient is requesting stronger pain meds temporarily which i feel is reasonable.  She will follow-up with GI next week.  Discussed plan with patient and family, they acknowledged understanding and agreed with plan of care.  Return precautions given for new or worsening symptoms.  Case discussed with attending physician, Dr. Fayrene FearingJames, who agrees with assessment and plan of care.  Garlon HatchetLisa M Morrigan Wickens, PA-C 08/16/15 1622  Rolland PorterMark James, MD 09/13/15 [phone]

## 2015-08-16 NOTE — ED Notes (Signed)
Patient C/O abdominal pain that began Thursday.  States that it progressively worsened and she had to come in today. C/O periumbilical pain and tenderness. C/O nausea without vomiting and occasional loose stools.  States that she has eaten once since yesterday.  States that she does not feel like she can eat.  States that she can drink.  States that she takes Tramadol for pain

## 2015-08-16 NOTE — Discharge Instructions (Signed)
Take the prescribed medication as directed. Follow-up with Dr. Ewing SchleinMagod-- call to make appt for next week if possible. Otherwise, you may follow-up with your primary care physician. Return to the ED for new or worsening symptoms.

## 2015-08-26 ENCOUNTER — Encounter: Payer: Self-pay | Admitting: Internal Medicine

## 2015-09-11 DIAGNOSIS — H04123 Dry eye syndrome of bilateral lacrimal glands: Secondary | ICD-10-CM | POA: Diagnosis not present

## 2015-09-11 DIAGNOSIS — H01001 Unspecified blepharitis right upper eyelid: Secondary | ICD-10-CM | POA: Diagnosis not present

## 2015-09-11 DIAGNOSIS — H01004 Unspecified blepharitis left upper eyelid: Secondary | ICD-10-CM | POA: Diagnosis not present

## 2015-09-11 DIAGNOSIS — Z961 Presence of intraocular lens: Secondary | ICD-10-CM | POA: Diagnosis not present

## 2015-09-15 DIAGNOSIS — N390 Urinary tract infection, site not specified: Secondary | ICD-10-CM | POA: Diagnosis not present

## 2015-09-15 DIAGNOSIS — B961 Klebsiella pneumoniae [K. pneumoniae] as the cause of diseases classified elsewhere: Secondary | ICD-10-CM | POA: Diagnosis not present

## 2015-10-07 DIAGNOSIS — M5414 Radiculopathy, thoracic region: Secondary | ICD-10-CM | POA: Diagnosis not present

## 2015-10-30 DIAGNOSIS — J301 Allergic rhinitis due to pollen: Secondary | ICD-10-CM | POA: Diagnosis not present

## 2015-11-03 DIAGNOSIS — R350 Frequency of micturition: Secondary | ICD-10-CM | POA: Diagnosis not present

## 2015-11-03 DIAGNOSIS — R8271 Bacteriuria: Secondary | ICD-10-CM | POA: Diagnosis not present

## 2015-11-03 DIAGNOSIS — B961 Klebsiella pneumoniae [K. pneumoniae] as the cause of diseases classified elsewhere: Secondary | ICD-10-CM | POA: Diagnosis not present

## 2015-11-03 DIAGNOSIS — N390 Urinary tract infection, site not specified: Secondary | ICD-10-CM | POA: Diagnosis not present

## 2015-11-24 DIAGNOSIS — S81812A Laceration without foreign body, left lower leg, initial encounter: Secondary | ICD-10-CM | POA: Diagnosis not present

## 2015-11-24 DIAGNOSIS — R109 Unspecified abdominal pain: Secondary | ICD-10-CM | POA: Diagnosis not present

## 2015-11-24 DIAGNOSIS — T148 Other injury of unspecified body region: Secondary | ICD-10-CM | POA: Diagnosis not present

## 2015-12-03 DIAGNOSIS — N3000 Acute cystitis without hematuria: Secondary | ICD-10-CM | POA: Diagnosis not present

## 2015-12-03 DIAGNOSIS — I10 Essential (primary) hypertension: Secondary | ICD-10-CM | POA: Diagnosis not present

## 2015-12-03 DIAGNOSIS — R2689 Other abnormalities of gait and mobility: Secondary | ICD-10-CM | POA: Diagnosis not present

## 2015-12-03 DIAGNOSIS — K219 Gastro-esophageal reflux disease without esophagitis: Secondary | ICD-10-CM | POA: Diagnosis not present

## 2015-12-03 DIAGNOSIS — R109 Unspecified abdominal pain: Secondary | ICD-10-CM | POA: Diagnosis not present

## 2015-12-03 DIAGNOSIS — M810 Age-related osteoporosis without current pathological fracture: Secondary | ICD-10-CM | POA: Diagnosis not present

## 2015-12-22 DIAGNOSIS — Z803 Family history of malignant neoplasm of breast: Secondary | ICD-10-CM | POA: Diagnosis not present

## 2015-12-22 DIAGNOSIS — Z1231 Encounter for screening mammogram for malignant neoplasm of breast: Secondary | ICD-10-CM | POA: Diagnosis not present

## 2016-01-02 DIAGNOSIS — M1712 Unilateral primary osteoarthritis, left knee: Secondary | ICD-10-CM | POA: Diagnosis not present

## 2016-01-20 IMAGING — CR DG CHEST 2V
2 series · 2 of 2 positions shown · non-contrast
Comparison: 08/18/2006 chest radiograph.

CLINICAL DATA: RIGHT-sided chest pain with inspiration. Symptom
onset yesterday. Initial encounter.

EXAM:
CHEST  2 VIEW

[w chest pa]
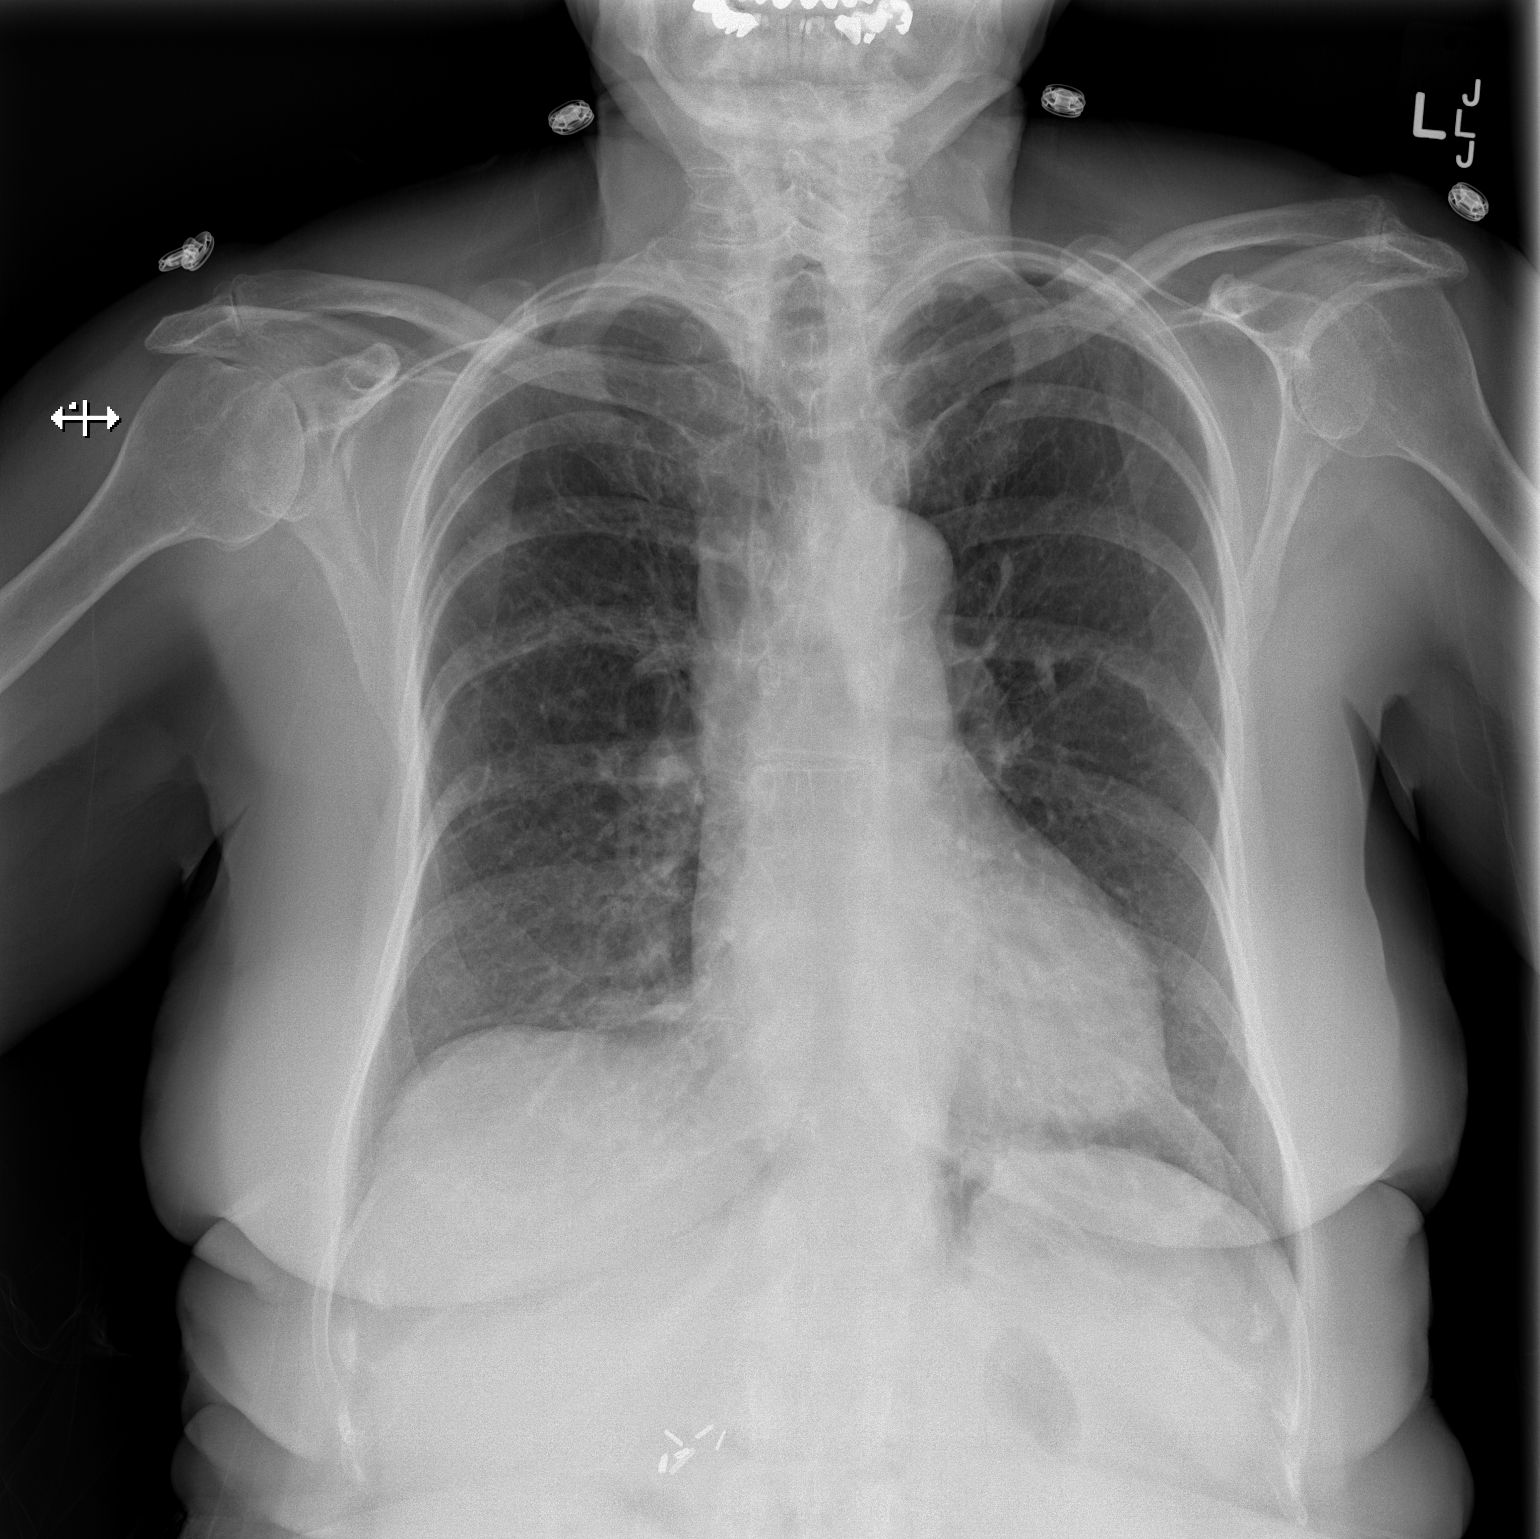

[w chest lat]
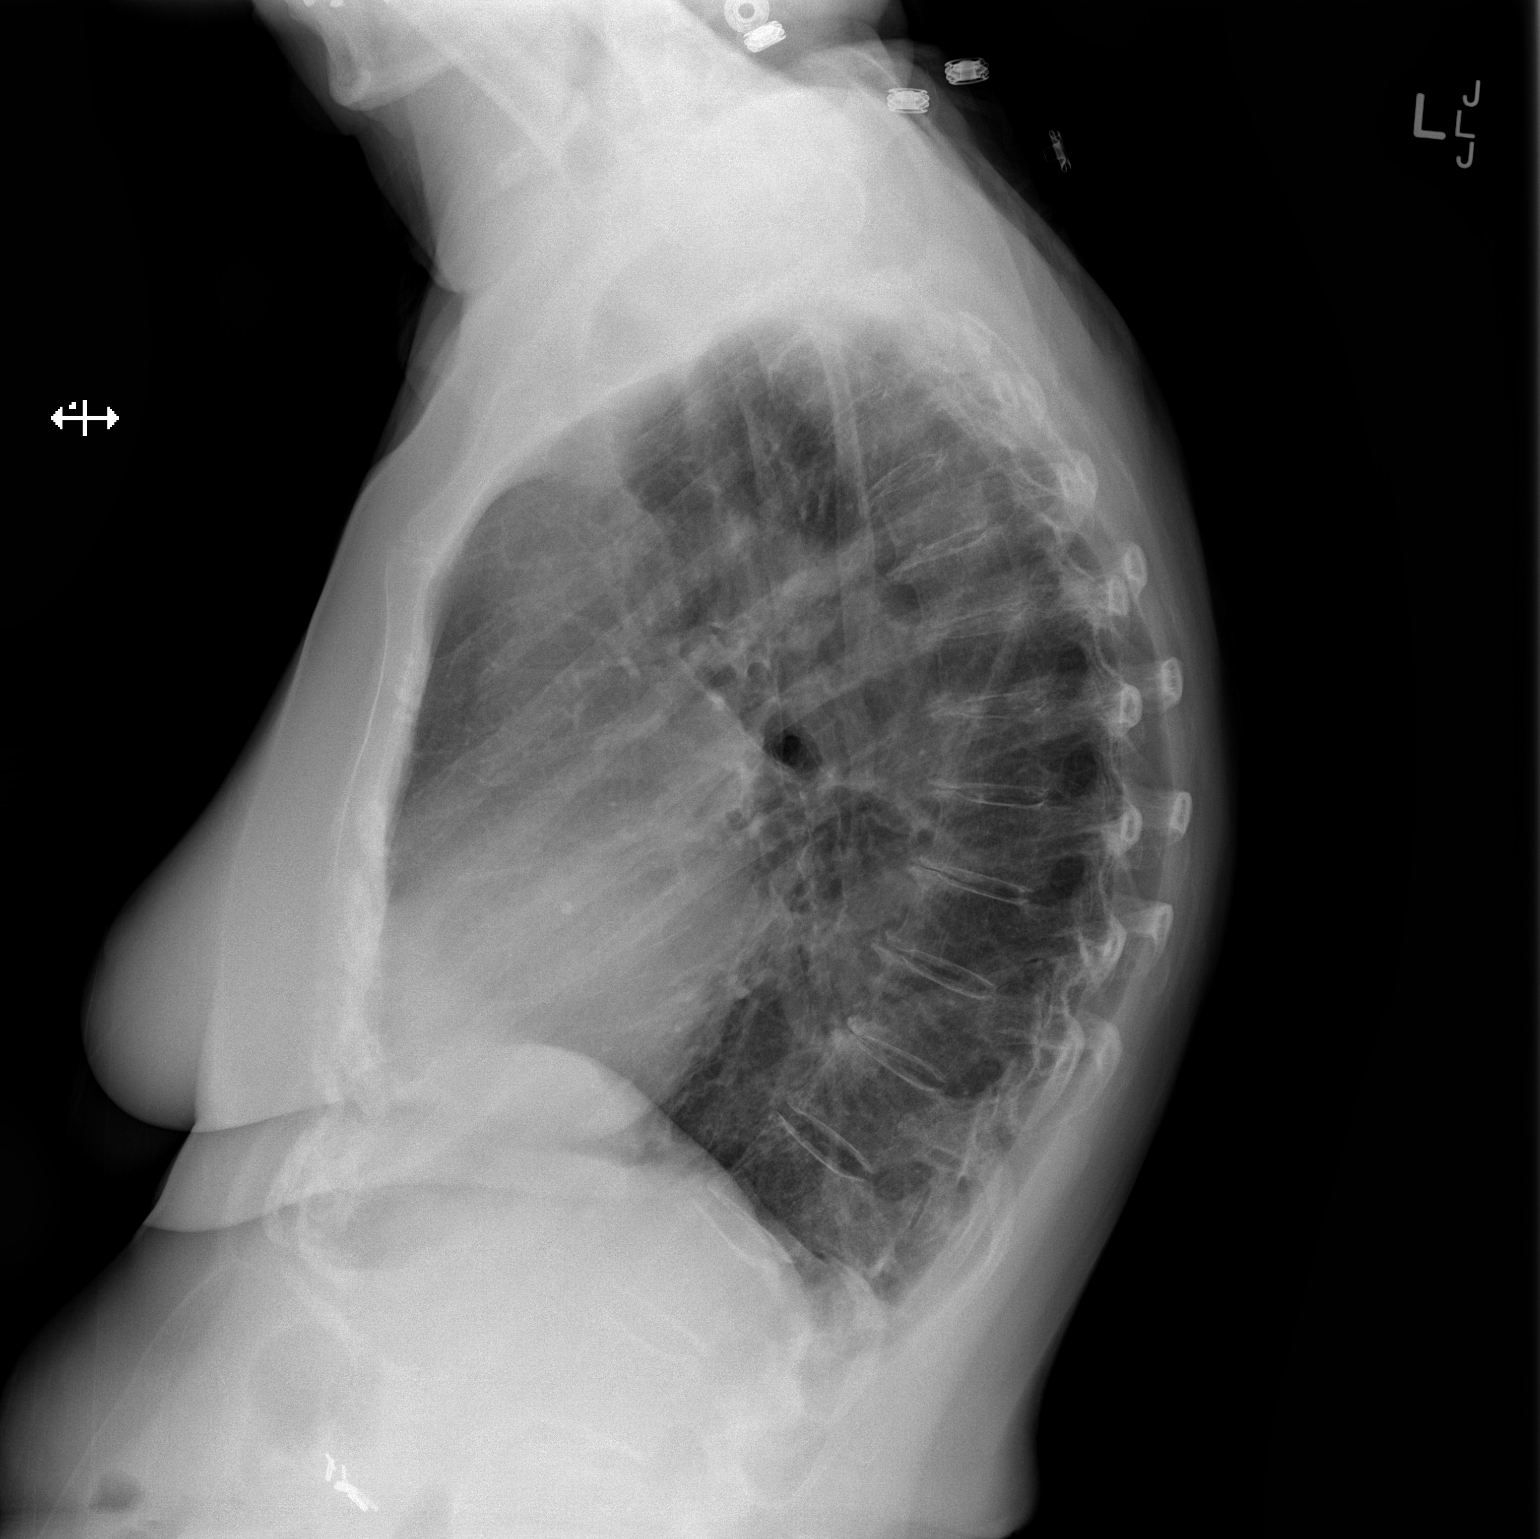

[2 of 2 positions shown; findings below may reference images not displayed]

FINDINGS: Chronic bronchitic changes are present at the RIGHT lung base.
Aortic arch atherosclerosis. Cholecystectomy clips are present in
the right upper quadrant.

The cardiopericardial silhouette is within normal limits. Thoracic
kyphosis and thoracic spondylosis.
IMPRESSION: No active cardiopulmonary disease.

## 2016-01-20 IMAGING — CR DG LUMBAR SPINE COMPLETE 4+V
5 series · 5 of 5 positions shown · non-contrast
Comparison: Abdominal pelvic CT 08/22/2014. Abdominal radiographs
10/26/2010.

CLINICAL DATA: Right-sided low back pain on awakening today. No
acute injury. Initial encounter.

EXAM:
LUMBAR SPINE - COMPLETE 4+ VIEW

[t lumbar spine ap]
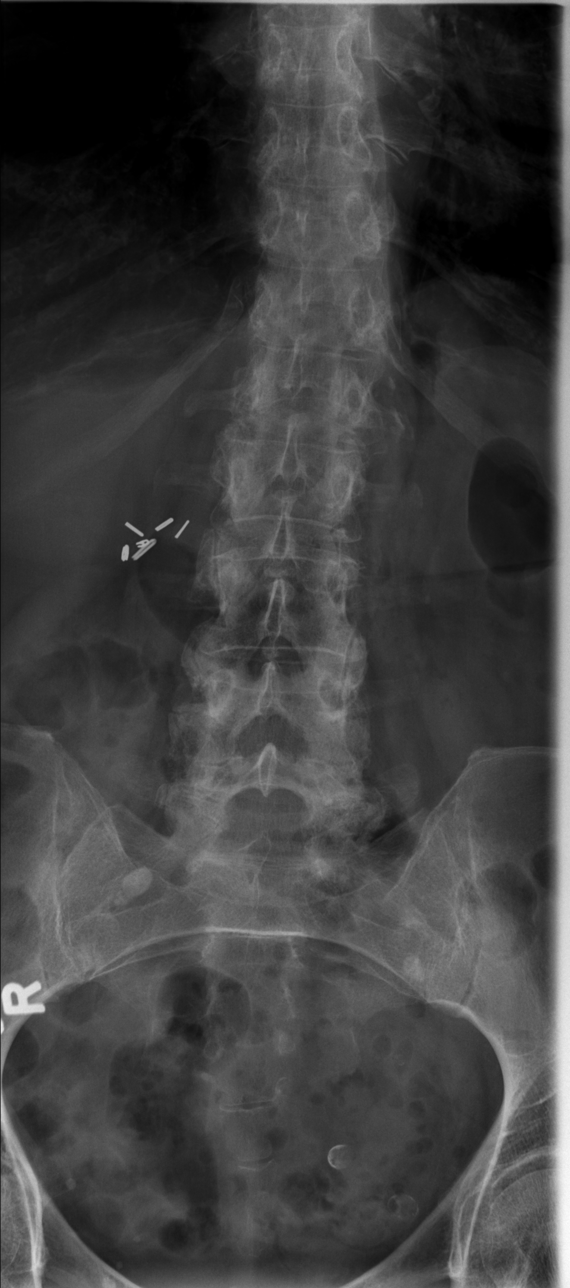

[t lumbar spine obl (1 of 2)]
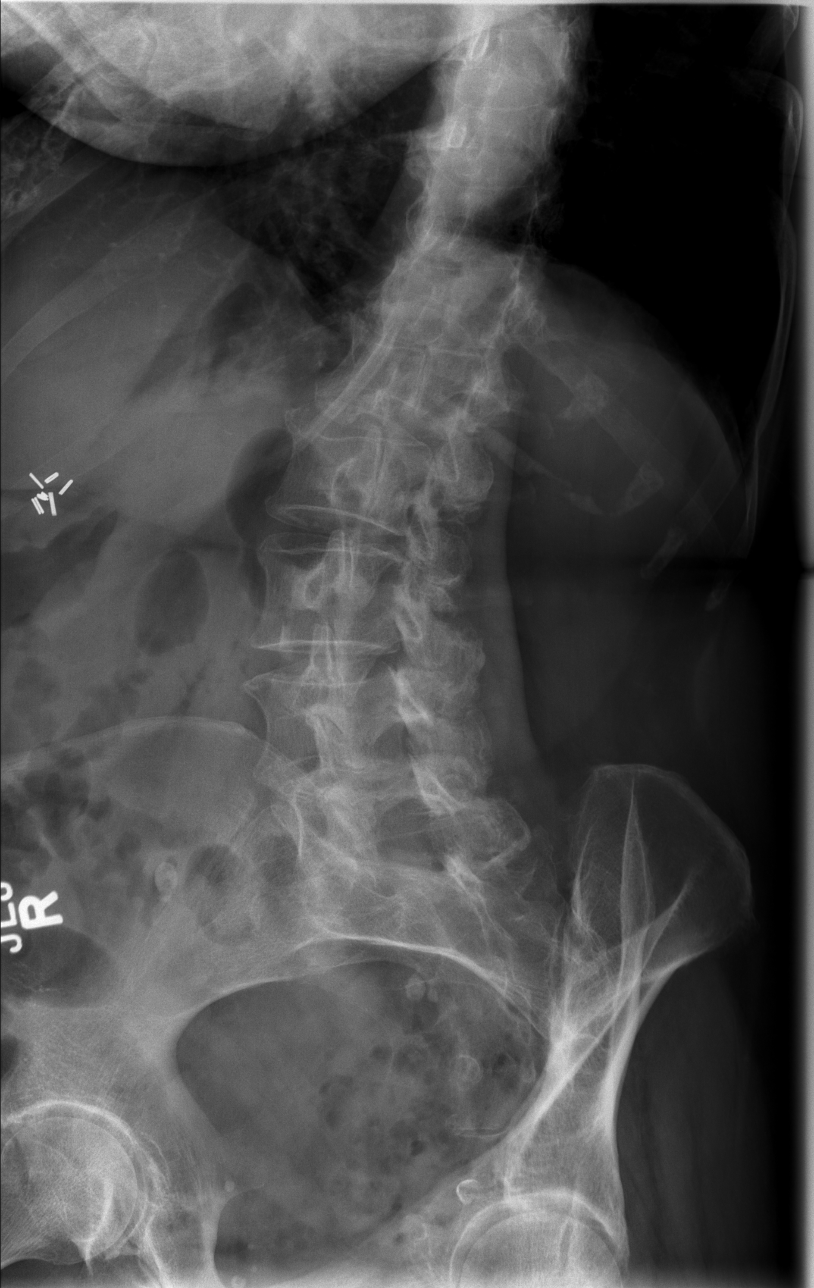

[t lumbar spine obl (2 of 2)]
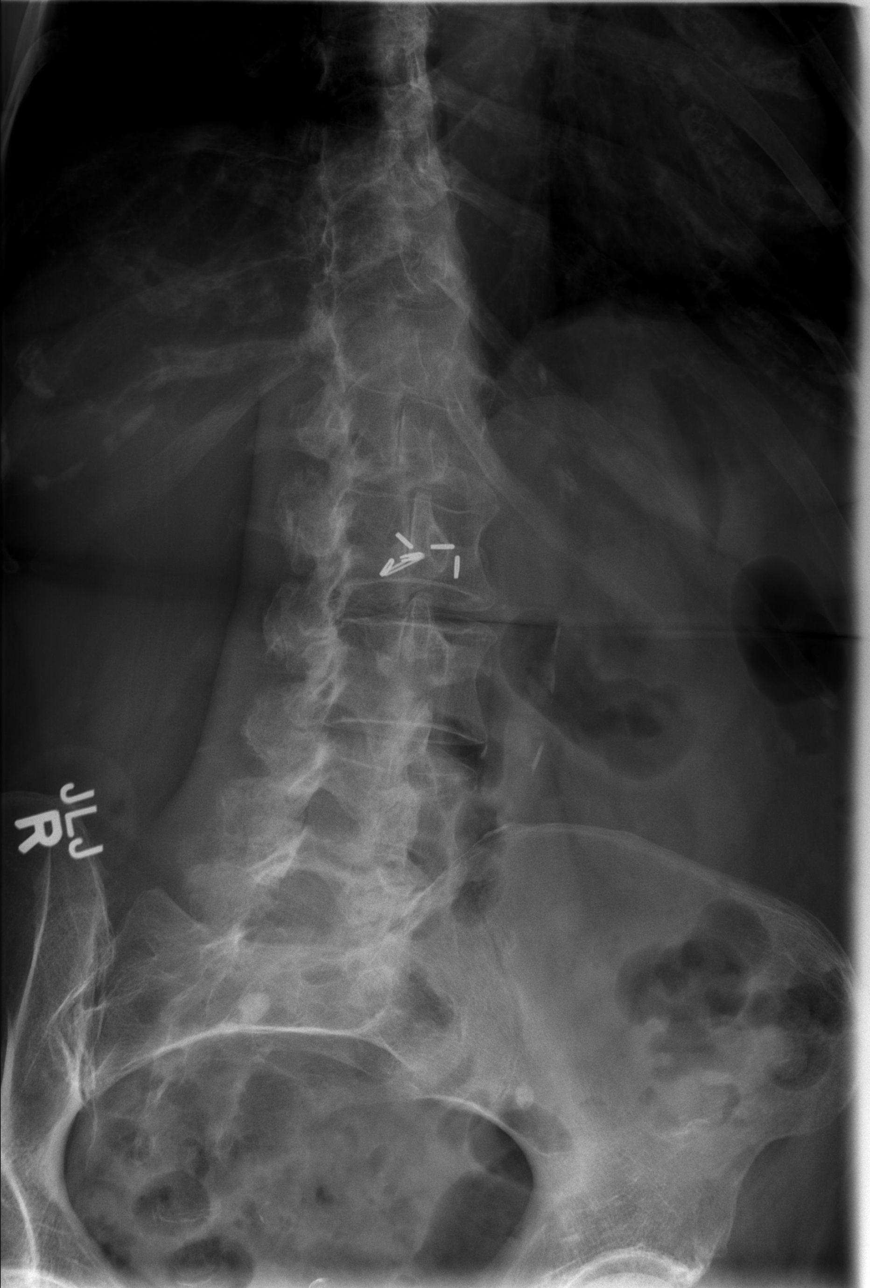

[t lumbar spine lat]
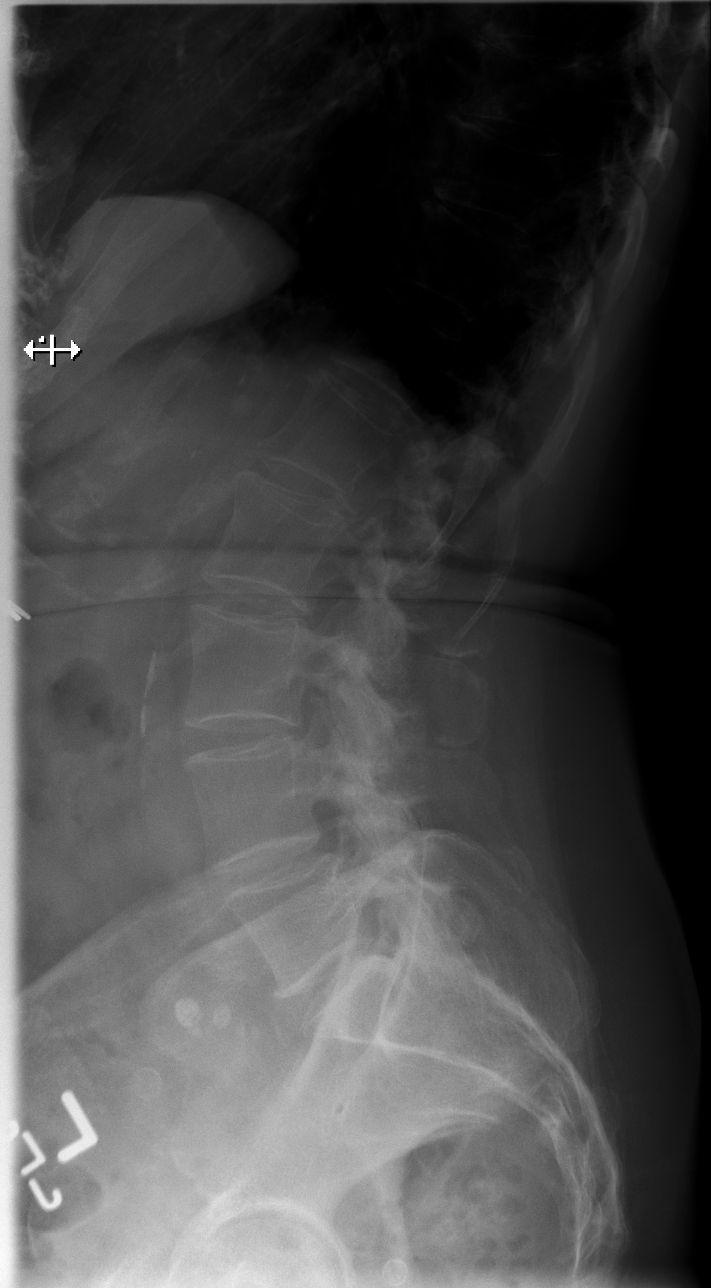

[t lumbar l-5 s-1 spot]
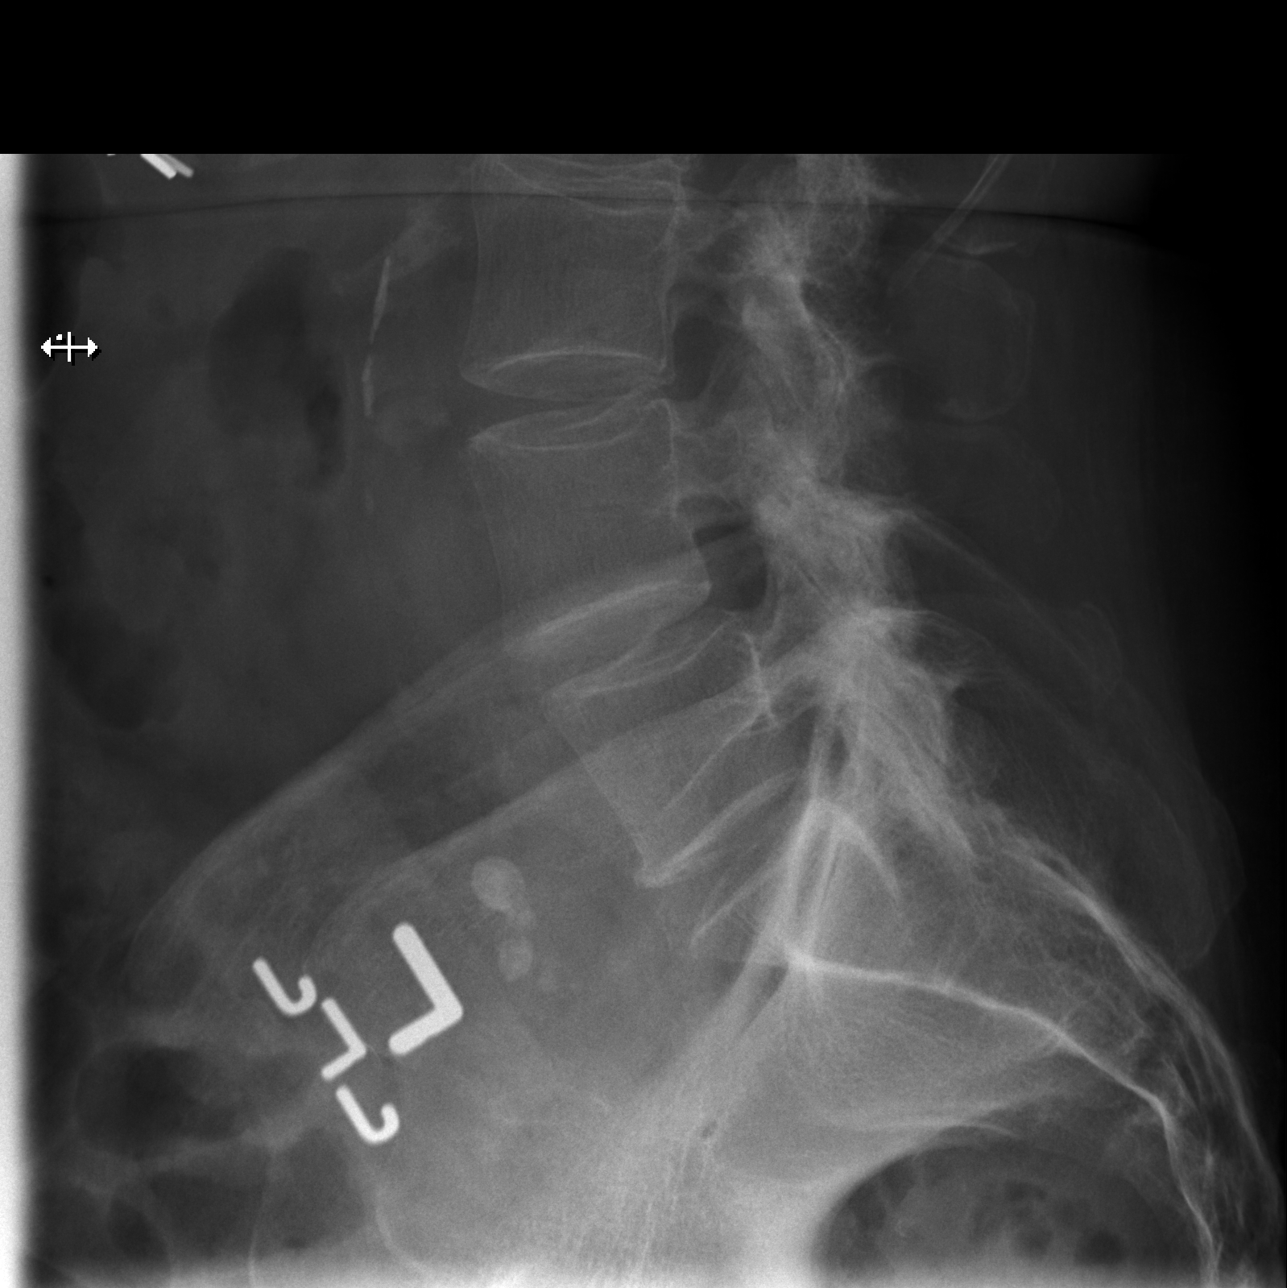

[5 of 5 positions shown; findings below may reference images not displayed]

FINDINGS: There are 5 lumbar type vertebral bodies. There is a stable mild
convex right scoliosis. No fracture or pars defect is evident. The
lumbar disc spaces are relatively preserved. There is multilevel
facet disease. Scattered vascular calcifications/ phleboliths again
noted.
IMPRESSION: Stable lumbar spondylosis and convex right scoliosis. No acute
osseous findings.

## 2016-02-06 DIAGNOSIS — C44319 Basal cell carcinoma of skin of other parts of face: Secondary | ICD-10-CM | POA: Diagnosis not present

## 2016-02-06 DIAGNOSIS — Z1283 Encounter for screening for malignant neoplasm of skin: Secondary | ICD-10-CM | POA: Diagnosis not present

## 2016-02-06 DIAGNOSIS — X32XXXD Exposure to sunlight, subsequent encounter: Secondary | ICD-10-CM | POA: Diagnosis not present

## 2016-02-06 DIAGNOSIS — L570 Actinic keratosis: Secondary | ICD-10-CM | POA: Diagnosis not present

## 2016-02-06 DIAGNOSIS — D1801 Hemangioma of skin and subcutaneous tissue: Secondary | ICD-10-CM | POA: Diagnosis not present

## 2016-02-06 DIAGNOSIS — D225 Melanocytic nevi of trunk: Secondary | ICD-10-CM | POA: Diagnosis not present

## 2016-02-09 DIAGNOSIS — H01001 Unspecified blepharitis right upper eyelid: Secondary | ICD-10-CM | POA: Diagnosis not present

## 2016-02-09 DIAGNOSIS — H04123 Dry eye syndrome of bilateral lacrimal glands: Secondary | ICD-10-CM | POA: Diagnosis not present

## 2016-02-09 DIAGNOSIS — H401132 Primary open-angle glaucoma, bilateral, moderate stage: Secondary | ICD-10-CM | POA: Diagnosis not present

## 2016-02-09 DIAGNOSIS — H02055 Trichiasis without entropian left lower eyelid: Secondary | ICD-10-CM | POA: Diagnosis not present

## 2016-03-10 DIAGNOSIS — C44319 Basal cell carcinoma of skin of other parts of face: Secondary | ICD-10-CM | POA: Diagnosis not present

## 2016-03-30 DIAGNOSIS — M5414 Radiculopathy, thoracic region: Secondary | ICD-10-CM | POA: Diagnosis not present

## 2016-03-30 DIAGNOSIS — M792 Neuralgia and neuritis, unspecified: Secondary | ICD-10-CM | POA: Diagnosis not present

## 2016-03-30 DIAGNOSIS — Z5181 Encounter for therapeutic drug level monitoring: Secondary | ICD-10-CM | POA: Diagnosis not present

## 2016-03-30 DIAGNOSIS — Z79899 Other long term (current) drug therapy: Secondary | ICD-10-CM | POA: Diagnosis not present

## 2016-05-04 DIAGNOSIS — C44319 Basal cell carcinoma of skin of other parts of face: Secondary | ICD-10-CM | POA: Diagnosis not present

## 2016-05-04 DIAGNOSIS — L570 Actinic keratosis: Secondary | ICD-10-CM | POA: Diagnosis not present

## 2016-05-04 DIAGNOSIS — X32XXXD Exposure to sunlight, subsequent encounter: Secondary | ICD-10-CM | POA: Diagnosis not present

## 2016-05-04 DIAGNOSIS — Z08 Encounter for follow-up examination after completed treatment for malignant neoplasm: Secondary | ICD-10-CM | POA: Diagnosis not present

## 2016-05-04 DIAGNOSIS — Z85828 Personal history of other malignant neoplasm of skin: Secondary | ICD-10-CM | POA: Diagnosis not present

## 2016-05-11 DIAGNOSIS — G894 Chronic pain syndrome: Secondary | ICD-10-CM | POA: Diagnosis not present

## 2016-05-11 DIAGNOSIS — M792 Neuralgia and neuritis, unspecified: Secondary | ICD-10-CM | POA: Diagnosis not present

## 2016-05-18 DIAGNOSIS — G894 Chronic pain syndrome: Secondary | ICD-10-CM | POA: Diagnosis not present

## 2016-05-28 DIAGNOSIS — M1712 Unilateral primary osteoarthritis, left knee: Secondary | ICD-10-CM | POA: Diagnosis not present

## 2016-06-03 DIAGNOSIS — K219 Gastro-esophageal reflux disease without esophagitis: Secondary | ICD-10-CM | POA: Diagnosis not present

## 2016-06-03 DIAGNOSIS — Z23 Encounter for immunization: Secondary | ICD-10-CM | POA: Diagnosis not present

## 2016-06-03 DIAGNOSIS — M810 Age-related osteoporosis without current pathological fracture: Secondary | ICD-10-CM | POA: Diagnosis not present

## 2016-06-03 DIAGNOSIS — I10 Essential (primary) hypertension: Secondary | ICD-10-CM | POA: Diagnosis not present

## 2016-06-03 DIAGNOSIS — R109 Unspecified abdominal pain: Secondary | ICD-10-CM | POA: Diagnosis not present

## 2016-06-03 DIAGNOSIS — R2689 Other abnormalities of gait and mobility: Secondary | ICD-10-CM | POA: Diagnosis not present

## 2016-06-03 DIAGNOSIS — Z0000 Encounter for general adult medical examination without abnormal findings: Secondary | ICD-10-CM | POA: Diagnosis not present

## 2016-06-11 DIAGNOSIS — M7062 Trochanteric bursitis, left hip: Secondary | ICD-10-CM | POA: Diagnosis not present

## 2016-06-14 DIAGNOSIS — H401122 Primary open-angle glaucoma, left eye, moderate stage: Secondary | ICD-10-CM | POA: Diagnosis not present

## 2016-06-14 DIAGNOSIS — H401112 Primary open-angle glaucoma, right eye, moderate stage: Secondary | ICD-10-CM | POA: Diagnosis not present

## 2016-06-15 DIAGNOSIS — M792 Neuralgia and neuritis, unspecified: Secondary | ICD-10-CM | POA: Diagnosis not present

## 2016-06-15 DIAGNOSIS — M5414 Radiculopathy, thoracic region: Secondary | ICD-10-CM | POA: Diagnosis not present

## 2016-06-15 DIAGNOSIS — G894 Chronic pain syndrome: Secondary | ICD-10-CM | POA: Diagnosis not present

## 2016-07-05 DIAGNOSIS — H02055 Trichiasis without entropian left lower eyelid: Secondary | ICD-10-CM | POA: Diagnosis not present

## 2016-07-05 DIAGNOSIS — H401112 Primary open-angle glaucoma, right eye, moderate stage: Secondary | ICD-10-CM | POA: Diagnosis not present

## 2016-07-05 DIAGNOSIS — H401122 Primary open-angle glaucoma, left eye, moderate stage: Secondary | ICD-10-CM | POA: Diagnosis not present

## 2016-07-05 DIAGNOSIS — H04123 Dry eye syndrome of bilateral lacrimal glands: Secondary | ICD-10-CM | POA: Diagnosis not present

## 2016-07-13 DIAGNOSIS — E875 Hyperkalemia: Secondary | ICD-10-CM | POA: Diagnosis not present

## 2016-07-13 DIAGNOSIS — E876 Hypokalemia: Secondary | ICD-10-CM | POA: Diagnosis not present

## 2016-07-15 DIAGNOSIS — M7062 Trochanteric bursitis, left hip: Secondary | ICD-10-CM | POA: Diagnosis not present

## 2016-07-16 DIAGNOSIS — G894 Chronic pain syndrome: Secondary | ICD-10-CM | POA: Diagnosis not present

## 2016-07-16 DIAGNOSIS — R1013 Epigastric pain: Secondary | ICD-10-CM | POA: Diagnosis not present

## 2016-07-16 DIAGNOSIS — G8929 Other chronic pain: Secondary | ICD-10-CM | POA: Diagnosis not present

## 2016-07-16 DIAGNOSIS — M5414 Radiculopathy, thoracic region: Secondary | ICD-10-CM | POA: Diagnosis not present

## 2016-07-16 DIAGNOSIS — M5417 Radiculopathy, lumbosacral region: Secondary | ICD-10-CM | POA: Diagnosis not present

## 2016-08-11 DIAGNOSIS — Z5181 Encounter for therapeutic drug level monitoring: Secondary | ICD-10-CM | POA: Diagnosis not present

## 2016-08-11 DIAGNOSIS — M5414 Radiculopathy, thoracic region: Secondary | ICD-10-CM | POA: Diagnosis not present

## 2016-08-11 DIAGNOSIS — G894 Chronic pain syndrome: Secondary | ICD-10-CM | POA: Diagnosis not present

## 2016-08-11 DIAGNOSIS — Z79899 Other long term (current) drug therapy: Secondary | ICD-10-CM | POA: Diagnosis not present

## 2016-08-11 DIAGNOSIS — M792 Neuralgia and neuritis, unspecified: Secondary | ICD-10-CM | POA: Diagnosis not present

## 2016-08-20 ENCOUNTER — Other Ambulatory Visit: Payer: Self-pay | Admitting: Family Medicine

## 2016-08-20 DIAGNOSIS — R1084 Generalized abdominal pain: Secondary | ICD-10-CM

## 2016-08-20 DIAGNOSIS — R109 Unspecified abdominal pain: Secondary | ICD-10-CM | POA: Diagnosis not present

## 2016-08-20 DIAGNOSIS — R102 Pelvic and perineal pain: Secondary | ICD-10-CM | POA: Diagnosis not present

## 2016-08-23 ENCOUNTER — Ambulatory Visit
Admission: RE | Admit: 2016-08-23 | Discharge: 2016-08-23 | Disposition: A | Discharge status: Home / Self Care | Payer: Medicare Other | Source: Ambulatory Visit | Attending: Family Medicine | Admitting: Family Medicine

## 2016-08-23 DIAGNOSIS — R1013 Epigastric pain: Secondary | ICD-10-CM | POA: Diagnosis not present

## 2016-08-23 DIAGNOSIS — R1084 Generalized abdominal pain: Secondary | ICD-10-CM

## 2016-08-23 MED ORDER — IOPAMIDOL (ISOVUE-300) INJECTION 61%
100.0000 mL | Freq: Once | INTRAVENOUS | Status: AC | PRN
  Administered 2016-08-23: 100 mL via INTRAVENOUS

## 2016-08-31 DIAGNOSIS — M5414 Radiculopathy, thoracic region: Secondary | ICD-10-CM | POA: Diagnosis not present

## 2016-08-31 DIAGNOSIS — G894 Chronic pain syndrome: Secondary | ICD-10-CM | POA: Diagnosis not present

## 2016-08-31 DIAGNOSIS — R1013 Epigastric pain: Secondary | ICD-10-CM | POA: Diagnosis not present

## 2016-08-31 DIAGNOSIS — M792 Neuralgia and neuritis, unspecified: Secondary | ICD-10-CM | POA: Diagnosis not present

## 2016-09-07 DIAGNOSIS — R1013 Epigastric pain: Secondary | ICD-10-CM | POA: Diagnosis not present

## 2016-09-07 DIAGNOSIS — M792 Neuralgia and neuritis, unspecified: Secondary | ICD-10-CM | POA: Diagnosis not present

## 2016-09-07 DIAGNOSIS — G894 Chronic pain syndrome: Secondary | ICD-10-CM | POA: Diagnosis not present

## 2016-09-07 DIAGNOSIS — M5414 Radiculopathy, thoracic region: Secondary | ICD-10-CM | POA: Diagnosis not present

## 2016-10-04 DIAGNOSIS — H01001 Unspecified blepharitis right upper eyelid: Secondary | ICD-10-CM | POA: Diagnosis not present

## 2016-10-04 DIAGNOSIS — H01004 Unspecified blepharitis left upper eyelid: Secondary | ICD-10-CM | POA: Diagnosis not present

## 2016-10-04 DIAGNOSIS — H04123 Dry eye syndrome of bilateral lacrimal glands: Secondary | ICD-10-CM | POA: Diagnosis not present

## 2016-10-04 DIAGNOSIS — H401133 Primary open-angle glaucoma, bilateral, severe stage: Secondary | ICD-10-CM | POA: Diagnosis not present

## 2016-10-05 DIAGNOSIS — G894 Chronic pain syndrome: Secondary | ICD-10-CM | POA: Diagnosis not present

## 2016-10-05 DIAGNOSIS — M792 Neuralgia and neuritis, unspecified: Secondary | ICD-10-CM | POA: Diagnosis not present

## 2016-10-05 DIAGNOSIS — R1013 Epigastric pain: Secondary | ICD-10-CM | POA: Diagnosis not present

## 2016-10-05 DIAGNOSIS — M5414 Radiculopathy, thoracic region: Secondary | ICD-10-CM | POA: Diagnosis not present

## 2016-10-08 DIAGNOSIS — M25512 Pain in left shoulder: Secondary | ICD-10-CM | POA: Diagnosis not present

## 2016-10-08 DIAGNOSIS — M7542 Impingement syndrome of left shoulder: Secondary | ICD-10-CM | POA: Diagnosis not present

## 2016-10-12 DIAGNOSIS — J300 Vasomotor rhinitis: Secondary | ICD-10-CM | POA: Diagnosis not present

## 2016-10-12 DIAGNOSIS — J301 Allergic rhinitis due to pollen: Secondary | ICD-10-CM | POA: Diagnosis not present

## 2016-10-12 DIAGNOSIS — R634 Abnormal weight loss: Secondary | ICD-10-CM | POA: Diagnosis not present

## 2016-10-14 ENCOUNTER — Encounter: Payer: Self-pay | Admitting: Anesthesiology

## 2016-10-15 DIAGNOSIS — R6883 Chills (without fever): Secondary | ICD-10-CM | POA: Diagnosis not present

## 2016-11-01 DIAGNOSIS — H02055 Trichiasis without entropian left lower eyelid: Secondary | ICD-10-CM | POA: Diagnosis not present

## 2016-11-01 DIAGNOSIS — H401133 Primary open-angle glaucoma, bilateral, severe stage: Secondary | ICD-10-CM | POA: Diagnosis not present

## 2016-11-01 DIAGNOSIS — H01001 Unspecified blepharitis right upper eyelid: Secondary | ICD-10-CM | POA: Diagnosis not present

## 2016-11-01 DIAGNOSIS — Z961 Presence of intraocular lens: Secondary | ICD-10-CM | POA: Diagnosis not present

## 2016-11-02 DIAGNOSIS — L820 Inflamed seborrheic keratosis: Secondary | ICD-10-CM | POA: Diagnosis not present

## 2016-11-02 DIAGNOSIS — L728 Other follicular cysts of the skin and subcutaneous tissue: Secondary | ICD-10-CM | POA: Diagnosis not present

## 2016-11-02 DIAGNOSIS — Z08 Encounter for follow-up examination after completed treatment for malignant neoplasm: Secondary | ICD-10-CM | POA: Diagnosis not present

## 2016-11-02 DIAGNOSIS — Z85828 Personal history of other malignant neoplasm of skin: Secondary | ICD-10-CM | POA: Diagnosis not present

## 2016-11-03 DIAGNOSIS — H401122 Primary open-angle glaucoma, left eye, moderate stage: Secondary | ICD-10-CM | POA: Diagnosis not present

## 2016-11-04 DIAGNOSIS — R8271 Bacteriuria: Secondary | ICD-10-CM | POA: Diagnosis not present

## 2016-11-04 DIAGNOSIS — R350 Frequency of micturition: Secondary | ICD-10-CM | POA: Diagnosis not present

## 2016-11-04 DIAGNOSIS — R351 Nocturia: Secondary | ICD-10-CM | POA: Diagnosis not present

## 2016-11-24 DIAGNOSIS — H401112 Primary open-angle glaucoma, right eye, moderate stage: Secondary | ICD-10-CM | POA: Diagnosis not present

## 2016-11-30 DIAGNOSIS — M5134 Other intervertebral disc degeneration, thoracic region: Secondary | ICD-10-CM | POA: Diagnosis not present

## 2016-11-30 DIAGNOSIS — M792 Neuralgia and neuritis, unspecified: Secondary | ICD-10-CM | POA: Diagnosis not present

## 2016-11-30 DIAGNOSIS — Z5181 Encounter for therapeutic drug level monitoring: Secondary | ICD-10-CM | POA: Diagnosis not present

## 2016-11-30 DIAGNOSIS — G894 Chronic pain syndrome: Secondary | ICD-10-CM | POA: Diagnosis not present

## 2016-11-30 DIAGNOSIS — Z79899 Other long term (current) drug therapy: Secondary | ICD-10-CM | POA: Diagnosis not present

## 2016-11-30 DIAGNOSIS — M4180 Other forms of scoliosis, site unspecified: Secondary | ICD-10-CM | POA: Diagnosis not present

## 2016-11-30 DIAGNOSIS — R109 Unspecified abdominal pain: Secondary | ICD-10-CM | POA: Diagnosis not present

## 2016-12-02 DIAGNOSIS — M810 Age-related osteoporosis without current pathological fracture: Secondary | ICD-10-CM | POA: Diagnosis not present

## 2016-12-02 DIAGNOSIS — I10 Essential (primary) hypertension: Secondary | ICD-10-CM | POA: Diagnosis not present

## 2016-12-02 DIAGNOSIS — M7542 Impingement syndrome of left shoulder: Secondary | ICD-10-CM | POA: Diagnosis not present

## 2016-12-02 DIAGNOSIS — R2689 Other abnormalities of gait and mobility: Secondary | ICD-10-CM | POA: Diagnosis not present

## 2016-12-02 DIAGNOSIS — K219 Gastro-esophageal reflux disease without esophagitis: Secondary | ICD-10-CM | POA: Diagnosis not present

## 2016-12-08 DIAGNOSIS — R1013 Epigastric pain: Secondary | ICD-10-CM | POA: Diagnosis not present

## 2016-12-16 DIAGNOSIS — M1712 Unilateral primary osteoarthritis, left knee: Secondary | ICD-10-CM | POA: Diagnosis not present

## 2016-12-27 DIAGNOSIS — H16102 Unspecified superficial keratitis, left eye: Secondary | ICD-10-CM | POA: Diagnosis not present

## 2016-12-27 DIAGNOSIS — H401132 Primary open-angle glaucoma, bilateral, moderate stage: Secondary | ICD-10-CM | POA: Diagnosis not present

## 2017-01-07 DIAGNOSIS — H401132 Primary open-angle glaucoma, bilateral, moderate stage: Secondary | ICD-10-CM | POA: Diagnosis not present

## 2017-01-07 DIAGNOSIS — H02055 Trichiasis without entropian left lower eyelid: Secondary | ICD-10-CM | POA: Diagnosis not present

## 2017-01-07 DIAGNOSIS — H04123 Dry eye syndrome of bilateral lacrimal glands: Secondary | ICD-10-CM | POA: Diagnosis not present

## 2017-01-12 DIAGNOSIS — R1013 Epigastric pain: Secondary | ICD-10-CM | POA: Diagnosis not present

## 2017-01-25 DIAGNOSIS — M792 Neuralgia and neuritis, unspecified: Secondary | ICD-10-CM | POA: Diagnosis not present

## 2017-01-25 DIAGNOSIS — G894 Chronic pain syndrome: Secondary | ICD-10-CM | POA: Diagnosis not present

## 2017-01-25 DIAGNOSIS — M5414 Radiculopathy, thoracic region: Secondary | ICD-10-CM | POA: Diagnosis not present

## 2017-01-25 DIAGNOSIS — R1013 Epigastric pain: Secondary | ICD-10-CM | POA: Diagnosis not present

## 2017-01-27 DIAGNOSIS — K2950 Unspecified chronic gastritis without bleeding: Secondary | ICD-10-CM | POA: Diagnosis not present

## 2017-01-27 DIAGNOSIS — K2960 Other gastritis without bleeding: Secondary | ICD-10-CM | POA: Diagnosis not present

## 2017-01-27 DIAGNOSIS — K2930 Chronic superficial gastritis without bleeding: Secondary | ICD-10-CM | POA: Diagnosis not present

## 2017-01-27 DIAGNOSIS — K2980 Duodenitis without bleeding: Secondary | ICD-10-CM | POA: Diagnosis not present

## 2017-01-27 DIAGNOSIS — Z9689 Presence of other specified functional implants: Secondary | ICD-10-CM | POA: Diagnosis not present

## 2017-01-27 DIAGNOSIS — I10 Essential (primary) hypertension: Secondary | ICD-10-CM | POA: Diagnosis not present

## 2017-01-27 DIAGNOSIS — G8929 Other chronic pain: Secondary | ICD-10-CM | POA: Diagnosis not present

## 2017-02-15 DIAGNOSIS — H0015 Chalazion left lower eyelid: Secondary | ICD-10-CM | POA: Diagnosis not present

## 2017-02-17 DIAGNOSIS — H401133 Primary open-angle glaucoma, bilateral, severe stage: Secondary | ICD-10-CM | POA: Diagnosis not present

## 2017-02-17 DIAGNOSIS — S0012XA Contusion of left eyelid and periocular area, initial encounter: Secondary | ICD-10-CM | POA: Diagnosis not present

## 2017-02-17 DIAGNOSIS — H0015 Chalazion left lower eyelid: Secondary | ICD-10-CM | POA: Diagnosis not present

## 2017-03-17 DIAGNOSIS — H401133 Primary open-angle glaucoma, bilateral, severe stage: Secondary | ICD-10-CM | POA: Diagnosis not present

## 2017-03-17 DIAGNOSIS — H0015 Chalazion left lower eyelid: Secondary | ICD-10-CM | POA: Diagnosis not present

## 2017-03-18 DIAGNOSIS — S92355A Nondisplaced fracture of fifth metatarsal bone, left foot, initial encounter for closed fracture: Secondary | ICD-10-CM | POA: Diagnosis not present

## 2017-03-21 DIAGNOSIS — Z5181 Encounter for therapeutic drug level monitoring: Secondary | ICD-10-CM | POA: Diagnosis not present

## 2017-03-21 DIAGNOSIS — G894 Chronic pain syndrome: Secondary | ICD-10-CM | POA: Diagnosis not present

## 2017-03-21 DIAGNOSIS — Z79899 Other long term (current) drug therapy: Secondary | ICD-10-CM | POA: Diagnosis not present

## 2017-03-21 DIAGNOSIS — M792 Neuralgia and neuritis, unspecified: Secondary | ICD-10-CM | POA: Diagnosis not present

## 2017-04-04 DIAGNOSIS — S92355D Nondisplaced fracture of fifth metatarsal bone, left foot, subsequent encounter for fracture with routine healing: Secondary | ICD-10-CM | POA: Diagnosis not present

## 2017-04-18 DIAGNOSIS — S92355D Nondisplaced fracture of fifth metatarsal bone, left foot, subsequent encounter for fracture with routine healing: Secondary | ICD-10-CM | POA: Diagnosis not present

## 2017-05-09 DIAGNOSIS — S92355D Nondisplaced fracture of fifth metatarsal bone, left foot, subsequent encounter for fracture with routine healing: Secondary | ICD-10-CM | POA: Diagnosis not present

## 2017-05-16 DIAGNOSIS — M5414 Radiculopathy, thoracic region: Secondary | ICD-10-CM | POA: Diagnosis not present

## 2017-05-16 DIAGNOSIS — R1013 Epigastric pain: Secondary | ICD-10-CM | POA: Diagnosis not present

## 2017-05-16 DIAGNOSIS — G894 Chronic pain syndrome: Secondary | ICD-10-CM | POA: Diagnosis not present

## 2017-05-16 DIAGNOSIS — M792 Neuralgia and neuritis, unspecified: Secondary | ICD-10-CM | POA: Diagnosis not present

## 2017-06-03 DIAGNOSIS — I10 Essential (primary) hypertension: Secondary | ICD-10-CM | POA: Diagnosis not present

## 2017-06-03 DIAGNOSIS — D72829 Elevated white blood cell count, unspecified: Secondary | ICD-10-CM | POA: Diagnosis not present

## 2017-06-03 DIAGNOSIS — M810 Age-related osteoporosis without current pathological fracture: Secondary | ICD-10-CM | POA: Diagnosis not present

## 2017-06-03 DIAGNOSIS — K219 Gastro-esophageal reflux disease without esophagitis: Secondary | ICD-10-CM | POA: Diagnosis not present

## 2017-06-08 DIAGNOSIS — Z1231 Encounter for screening mammogram for malignant neoplasm of breast: Secondary | ICD-10-CM | POA: Diagnosis not present

## 2017-06-13 DIAGNOSIS — N3090 Cystitis, unspecified without hematuria: Secondary | ICD-10-CM | POA: Diagnosis not present

## 2017-06-24 DIAGNOSIS — G8929 Other chronic pain: Secondary | ICD-10-CM | POA: Diagnosis not present

## 2017-06-24 DIAGNOSIS — M25562 Pain in left knee: Secondary | ICD-10-CM | POA: Diagnosis not present

## 2017-06-24 DIAGNOSIS — M1712 Unilateral primary osteoarthritis, left knee: Secondary | ICD-10-CM | POA: Diagnosis not present

## 2017-07-08 DIAGNOSIS — N390 Urinary tract infection, site not specified: Secondary | ICD-10-CM | POA: Diagnosis not present

## 2017-07-27 DIAGNOSIS — H401133 Primary open-angle glaucoma, bilateral, severe stage: Secondary | ICD-10-CM | POA: Diagnosis not present

## 2017-07-27 DIAGNOSIS — R1013 Epigastric pain: Secondary | ICD-10-CM | POA: Diagnosis not present

## 2017-07-27 DIAGNOSIS — M792 Neuralgia and neuritis, unspecified: Secondary | ICD-10-CM | POA: Diagnosis not present

## 2017-07-27 DIAGNOSIS — Z5181 Encounter for therapeutic drug level monitoring: Secondary | ICD-10-CM | POA: Diagnosis not present

## 2017-07-27 DIAGNOSIS — H4720 Unspecified optic atrophy: Secondary | ICD-10-CM | POA: Diagnosis not present

## 2017-07-27 DIAGNOSIS — H0015 Chalazion left lower eyelid: Secondary | ICD-10-CM | POA: Diagnosis not present

## 2017-07-27 DIAGNOSIS — G894 Chronic pain syndrome: Secondary | ICD-10-CM | POA: Diagnosis not present

## 2017-07-27 DIAGNOSIS — Z79899 Other long term (current) drug therapy: Secondary | ICD-10-CM | POA: Diagnosis not present

## 2017-07-27 DIAGNOSIS — M5414 Radiculopathy, thoracic region: Secondary | ICD-10-CM | POA: Diagnosis not present

## 2017-09-05 DIAGNOSIS — H0015 Chalazion left lower eyelid: Secondary | ICD-10-CM | POA: Diagnosis not present

## 2017-09-05 DIAGNOSIS — H401133 Primary open-angle glaucoma, bilateral, severe stage: Secondary | ICD-10-CM | POA: Diagnosis not present

## 2017-09-05 DIAGNOSIS — H02055 Trichiasis without entropian left lower eyelid: Secondary | ICD-10-CM | POA: Diagnosis not present

## 2017-09-05 DIAGNOSIS — H0100A Unspecified blepharitis right eye, upper and lower eyelids: Secondary | ICD-10-CM | POA: Diagnosis not present

## 2017-09-28 DIAGNOSIS — M5414 Radiculopathy, thoracic region: Secondary | ICD-10-CM | POA: Diagnosis not present

## 2017-09-28 DIAGNOSIS — M792 Neuralgia and neuritis, unspecified: Secondary | ICD-10-CM | POA: Diagnosis not present

## 2017-09-28 DIAGNOSIS — G894 Chronic pain syndrome: Secondary | ICD-10-CM | POA: Diagnosis not present

## 2017-09-28 DIAGNOSIS — R1013 Epigastric pain: Secondary | ICD-10-CM | POA: Diagnosis not present

## 2017-10-25 DIAGNOSIS — J301 Allergic rhinitis due to pollen: Secondary | ICD-10-CM | POA: Diagnosis not present

## 2017-10-25 DIAGNOSIS — J300 Vasomotor rhinitis: Secondary | ICD-10-CM | POA: Diagnosis not present

## 2017-11-02 DIAGNOSIS — R040 Epistaxis: Secondary | ICD-10-CM | POA: Diagnosis not present

## 2017-11-23 DIAGNOSIS — G894 Chronic pain syndrome: Secondary | ICD-10-CM | POA: Diagnosis not present

## 2017-11-23 DIAGNOSIS — M792 Neuralgia and neuritis, unspecified: Secondary | ICD-10-CM | POA: Diagnosis not present

## 2017-11-23 DIAGNOSIS — Z79899 Other long term (current) drug therapy: Secondary | ICD-10-CM | POA: Diagnosis not present

## 2017-11-23 DIAGNOSIS — Z5181 Encounter for therapeutic drug level monitoring: Secondary | ICD-10-CM | POA: Diagnosis not present

## 2017-12-01 DIAGNOSIS — S8012XA Contusion of left lower leg, initial encounter: Secondary | ICD-10-CM | POA: Diagnosis not present

## 2017-12-01 DIAGNOSIS — M810 Age-related osteoporosis without current pathological fracture: Secondary | ICD-10-CM | POA: Diagnosis not present

## 2017-12-01 DIAGNOSIS — I10 Essential (primary) hypertension: Secondary | ICD-10-CM | POA: Diagnosis not present

## 2017-12-01 DIAGNOSIS — K219 Gastro-esophageal reflux disease without esophagitis: Secondary | ICD-10-CM | POA: Diagnosis not present

## 2017-12-16 DIAGNOSIS — M1712 Unilateral primary osteoarthritis, left knee: Secondary | ICD-10-CM | POA: Diagnosis not present

## 2018-01-13 DIAGNOSIS — Z961 Presence of intraocular lens: Secondary | ICD-10-CM | POA: Diagnosis not present

## 2018-01-13 DIAGNOSIS — H401133 Primary open-angle glaucoma, bilateral, severe stage: Secondary | ICD-10-CM | POA: Diagnosis not present

## 2018-01-13 DIAGNOSIS — H04123 Dry eye syndrome of bilateral lacrimal glands: Secondary | ICD-10-CM | POA: Diagnosis not present

## 2018-01-13 DIAGNOSIS — H43813 Vitreous degeneration, bilateral: Secondary | ICD-10-CM | POA: Diagnosis not present

## 2018-01-18 DIAGNOSIS — M792 Neuralgia and neuritis, unspecified: Secondary | ICD-10-CM | POA: Diagnosis not present

## 2018-01-18 DIAGNOSIS — R1013 Epigastric pain: Secondary | ICD-10-CM | POA: Diagnosis not present

## 2018-01-18 DIAGNOSIS — G894 Chronic pain syndrome: Secondary | ICD-10-CM | POA: Diagnosis not present

## 2018-01-18 DIAGNOSIS — M5414 Radiculopathy, thoracic region: Secondary | ICD-10-CM | POA: Diagnosis not present

## 2018-01-26 DIAGNOSIS — M79642 Pain in left hand: Secondary | ICD-10-CM | POA: Diagnosis not present

## 2018-02-08 DIAGNOSIS — S92355A Nondisplaced fracture of fifth metatarsal bone, left foot, initial encounter for closed fracture: Secondary | ICD-10-CM | POA: Diagnosis not present

## 2018-02-08 DIAGNOSIS — M79642 Pain in left hand: Secondary | ICD-10-CM | POA: Diagnosis not present

## 2018-02-08 DIAGNOSIS — S62643D Nondisplaced fracture of proximal phalanx of left middle finger, subsequent encounter for fracture with routine healing: Secondary | ICD-10-CM | POA: Diagnosis not present

## 2018-02-08 DIAGNOSIS — M79672 Pain in left foot: Secondary | ICD-10-CM | POA: Diagnosis not present

## 2018-02-23 DIAGNOSIS — S62643D Nondisplaced fracture of proximal phalanx of left middle finger, subsequent encounter for fracture with routine healing: Secondary | ICD-10-CM | POA: Diagnosis not present

## 2018-02-23 DIAGNOSIS — S92302D Fracture of unspecified metatarsal bone(s), left foot, subsequent encounter for fracture with routine healing: Secondary | ICD-10-CM | POA: Diagnosis not present

## 2018-02-23 DIAGNOSIS — M79672 Pain in left foot: Secondary | ICD-10-CM | POA: Diagnosis not present

## 2018-03-15 DIAGNOSIS — Z79899 Other long term (current) drug therapy: Secondary | ICD-10-CM | POA: Diagnosis not present

## 2018-03-15 DIAGNOSIS — M792 Neuralgia and neuritis, unspecified: Secondary | ICD-10-CM | POA: Diagnosis not present

## 2018-03-15 DIAGNOSIS — G894 Chronic pain syndrome: Secondary | ICD-10-CM | POA: Diagnosis not present

## 2018-03-15 DIAGNOSIS — Z5181 Encounter for therapeutic drug level monitoring: Secondary | ICD-10-CM | POA: Diagnosis not present

## 2018-03-16 DIAGNOSIS — M79642 Pain in left hand: Secondary | ICD-10-CM | POA: Diagnosis not present

## 2018-03-16 DIAGNOSIS — M79672 Pain in left foot: Secondary | ICD-10-CM | POA: Diagnosis not present

## 2018-03-16 DIAGNOSIS — S62643D Nondisplaced fracture of proximal phalanx of left middle finger, subsequent encounter for fracture with routine healing: Secondary | ICD-10-CM | POA: Diagnosis not present

## 2018-05-02 DIAGNOSIS — M5414 Radiculopathy, thoracic region: Secondary | ICD-10-CM | POA: Diagnosis not present

## 2018-05-02 DIAGNOSIS — M792 Neuralgia and neuritis, unspecified: Secondary | ICD-10-CM | POA: Diagnosis not present

## 2018-05-02 DIAGNOSIS — G894 Chronic pain syndrome: Secondary | ICD-10-CM | POA: Diagnosis not present

## 2018-05-02 DIAGNOSIS — R1013 Epigastric pain: Secondary | ICD-10-CM | POA: Diagnosis not present

## 2018-06-01 DIAGNOSIS — M810 Age-related osteoporosis without current pathological fracture: Secondary | ICD-10-CM | POA: Diagnosis not present

## 2018-06-01 DIAGNOSIS — I10 Essential (primary) hypertension: Secondary | ICD-10-CM | POA: Diagnosis not present

## 2018-06-01 DIAGNOSIS — Z23 Encounter for immunization: Secondary | ICD-10-CM | POA: Diagnosis not present

## 2018-06-01 DIAGNOSIS — K219 Gastro-esophageal reflux disease without esophagitis: Secondary | ICD-10-CM | POA: Diagnosis not present

## 2018-06-14 DIAGNOSIS — Z1231 Encounter for screening mammogram for malignant neoplasm of breast: Secondary | ICD-10-CM | POA: Diagnosis not present

## 2018-06-14 DIAGNOSIS — Z90710 Acquired absence of both cervix and uterus: Secondary | ICD-10-CM | POA: Diagnosis not present

## 2018-06-14 DIAGNOSIS — M810 Age-related osteoporosis without current pathological fracture: Secondary | ICD-10-CM | POA: Diagnosis not present

## 2018-06-15 DIAGNOSIS — H02055 Trichiasis without entropian left lower eyelid: Secondary | ICD-10-CM | POA: Diagnosis not present

## 2018-06-15 DIAGNOSIS — H04123 Dry eye syndrome of bilateral lacrimal glands: Secondary | ICD-10-CM | POA: Diagnosis not present

## 2018-06-15 DIAGNOSIS — H401133 Primary open-angle glaucoma, bilateral, severe stage: Secondary | ICD-10-CM | POA: Diagnosis not present

## 2018-06-15 DIAGNOSIS — H0100A Unspecified blepharitis right eye, upper and lower eyelids: Secondary | ICD-10-CM | POA: Diagnosis not present

## 2018-06-27 DIAGNOSIS — Z5181 Encounter for therapeutic drug level monitoring: Secondary | ICD-10-CM | POA: Diagnosis not present

## 2018-06-27 DIAGNOSIS — M792 Neuralgia and neuritis, unspecified: Secondary | ICD-10-CM | POA: Diagnosis not present

## 2018-06-27 DIAGNOSIS — Z79899 Other long term (current) drug therapy: Secondary | ICD-10-CM | POA: Diagnosis not present

## 2018-06-27 DIAGNOSIS — G894 Chronic pain syndrome: Secondary | ICD-10-CM | POA: Diagnosis not present

## 2018-07-12 DIAGNOSIS — M25562 Pain in left knee: Secondary | ICD-10-CM | POA: Diagnosis not present

## 2018-07-12 DIAGNOSIS — M25561 Pain in right knee: Secondary | ICD-10-CM | POA: Diagnosis not present

## 2018-07-14 DIAGNOSIS — M7541 Impingement syndrome of right shoulder: Secondary | ICD-10-CM | POA: Diagnosis not present

## 2018-07-14 DIAGNOSIS — M25511 Pain in right shoulder: Secondary | ICD-10-CM | POA: Diagnosis not present

## 2018-08-22 ENCOUNTER — Other Ambulatory Visit: Payer: Self-pay | Admitting: Family Medicine

## 2018-08-22 ENCOUNTER — Ambulatory Visit
Admission: RE | Admit: 2018-08-22 | Discharge: 2018-08-22 | Disposition: A | Discharge status: Home / Self Care | Payer: Medicare Other | Source: Ambulatory Visit | Attending: Family Medicine | Admitting: Family Medicine

## 2018-08-22 DIAGNOSIS — R0789 Other chest pain: Secondary | ICD-10-CM

## 2018-08-22 DIAGNOSIS — R8290 Unspecified abnormal findings in urine: Secondary | ICD-10-CM | POA: Diagnosis not present

## 2018-08-22 DIAGNOSIS — R634 Abnormal weight loss: Secondary | ICD-10-CM | POA: Diagnosis not present

## 2018-08-22 DIAGNOSIS — R109 Unspecified abdominal pain: Secondary | ICD-10-CM | POA: Diagnosis not present

## 2018-08-22 DIAGNOSIS — R5383 Other fatigue: Secondary | ICD-10-CM | POA: Diagnosis not present

## 2018-08-22 DIAGNOSIS — R0602 Shortness of breath: Secondary | ICD-10-CM | POA: Diagnosis not present

## 2018-08-24 DIAGNOSIS — M792 Neuralgia and neuritis, unspecified: Secondary | ICD-10-CM | POA: Diagnosis not present

## 2018-08-24 DIAGNOSIS — R1013 Epigastric pain: Secondary | ICD-10-CM | POA: Diagnosis not present

## 2018-08-24 DIAGNOSIS — G894 Chronic pain syndrome: Secondary | ICD-10-CM | POA: Diagnosis not present

## 2018-09-07 DIAGNOSIS — R634 Abnormal weight loss: Secondary | ICD-10-CM | POA: Diagnosis not present

## 2018-09-19 DIAGNOSIS — M7541 Impingement syndrome of right shoulder: Secondary | ICD-10-CM | POA: Diagnosis not present

## 2018-09-19 DIAGNOSIS — M25511 Pain in right shoulder: Secondary | ICD-10-CM | POA: Diagnosis not present

## 2018-11-03 DIAGNOSIS — N760 Acute vaginitis: Secondary | ICD-10-CM | POA: Diagnosis not present

## 2018-11-03 DIAGNOSIS — N3000 Acute cystitis without hematuria: Secondary | ICD-10-CM | POA: Diagnosis not present

## 2018-11-21 DIAGNOSIS — L309 Dermatitis, unspecified: Secondary | ICD-10-CM | POA: Diagnosis not present

## 2018-11-21 DIAGNOSIS — N760 Acute vaginitis: Secondary | ICD-10-CM | POA: Diagnosis not present

## 2018-11-21 DIAGNOSIS — N8110 Cystocele, unspecified: Secondary | ICD-10-CM | POA: Diagnosis not present

## 2019-01-12 DIAGNOSIS — M25511 Pain in right shoulder: Secondary | ICD-10-CM | POA: Diagnosis not present

## 2019-01-26 DIAGNOSIS — H04123 Dry eye syndrome of bilateral lacrimal glands: Secondary | ICD-10-CM | POA: Diagnosis not present

## 2019-01-26 DIAGNOSIS — H401133 Primary open-angle glaucoma, bilateral, severe stage: Secondary | ICD-10-CM | POA: Diagnosis not present

## 2019-01-26 DIAGNOSIS — Z961 Presence of intraocular lens: Secondary | ICD-10-CM | POA: Diagnosis not present

## 2019-02-02 DIAGNOSIS — M810 Age-related osteoporosis without current pathological fracture: Secondary | ICD-10-CM | POA: Diagnosis not present

## 2019-02-02 DIAGNOSIS — N3000 Acute cystitis without hematuria: Secondary | ICD-10-CM | POA: Diagnosis not present

## 2019-02-02 DIAGNOSIS — R109 Unspecified abdominal pain: Secondary | ICD-10-CM | POA: Diagnosis not present

## 2019-02-02 DIAGNOSIS — I10 Essential (primary) hypertension: Secondary | ICD-10-CM | POA: Diagnosis not present

## 2019-02-06 DIAGNOSIS — Z0120 Encounter for dental examination and cleaning without abnormal findings: Secondary | ICD-10-CM | POA: Diagnosis not present

## 2019-02-07 DIAGNOSIS — I10 Essential (primary) hypertension: Secondary | ICD-10-CM | POA: Diagnosis not present

## 2019-04-10 DIAGNOSIS — L02433 Carbuncle of right upper limb: Secondary | ICD-10-CM | POA: Diagnosis not present

## 2019-04-10 DIAGNOSIS — I10 Essential (primary) hypertension: Secondary | ICD-10-CM | POA: Diagnosis not present

## 2019-04-10 DIAGNOSIS — R220 Localized swelling, mass and lump, head: Secondary | ICD-10-CM | POA: Diagnosis not present

## 2019-04-10 DIAGNOSIS — L0293 Carbuncle, unspecified: Secondary | ICD-10-CM | POA: Diagnosis not present

## 2019-06-01 DIAGNOSIS — R609 Edema, unspecified: Secondary | ICD-10-CM | POA: Diagnosis not present

## 2019-06-01 DIAGNOSIS — N3000 Acute cystitis without hematuria: Secondary | ICD-10-CM | POA: Diagnosis not present

## 2019-06-07 DIAGNOSIS — M1712 Unilateral primary osteoarthritis, left knee: Secondary | ICD-10-CM | POA: Diagnosis not present

## 2019-06-07 DIAGNOSIS — M25562 Pain in left knee: Secondary | ICD-10-CM | POA: Diagnosis not present

## 2019-06-29 DIAGNOSIS — R8290 Unspecified abnormal findings in urine: Secondary | ICD-10-CM | POA: Diagnosis not present

## 2019-06-29 DIAGNOSIS — R300 Dysuria: Secondary | ICD-10-CM | POA: Diagnosis not present

## 2019-06-30 DIAGNOSIS — R3989 Other symptoms and signs involving the genitourinary system: Secondary | ICD-10-CM | POA: Diagnosis not present

## 2019-07-02 DIAGNOSIS — Z961 Presence of intraocular lens: Secondary | ICD-10-CM | POA: Diagnosis not present

## 2019-07-02 DIAGNOSIS — H16223 Keratoconjunctivitis sicca, not specified as Sjogren's, bilateral: Secondary | ICD-10-CM | POA: Diagnosis not present

## 2019-07-02 DIAGNOSIS — H43813 Vitreous degeneration, bilateral: Secondary | ICD-10-CM | POA: Diagnosis not present

## 2019-07-02 DIAGNOSIS — H401133 Primary open-angle glaucoma, bilateral, severe stage: Secondary | ICD-10-CM | POA: Diagnosis not present

## 2019-08-13 DIAGNOSIS — I10 Essential (primary) hypertension: Secondary | ICD-10-CM | POA: Diagnosis not present

## 2019-08-13 DIAGNOSIS — M810 Age-related osteoporosis without current pathological fracture: Secondary | ICD-10-CM | POA: Diagnosis not present

## 2019-08-13 DIAGNOSIS — N8110 Cystocele, unspecified: Secondary | ICD-10-CM | POA: Diagnosis not present

## 2019-08-13 DIAGNOSIS — K219 Gastro-esophageal reflux disease without esophagitis: Secondary | ICD-10-CM | POA: Diagnosis not present

## 2019-08-29 DIAGNOSIS — Z0120 Encounter for dental examination and cleaning without abnormal findings: Secondary | ICD-10-CM | POA: Diagnosis not present

## 2019-09-08 ENCOUNTER — Ambulatory Visit: Payer: Medicare Other

## 2019-09-14 ENCOUNTER — Ambulatory Visit: Payer: Medicare Other | Attending: Internal Medicine

## 2019-09-14 DIAGNOSIS — Z23 Encounter for immunization: Secondary | ICD-10-CM | POA: Insufficient documentation

## 2019-09-14 NOTE — Progress Notes (Signed)
   Covid-19 Vaccination Clinic  Name:  Sandra Mclaughlin    MRN: [id number] DOB: [birth year]  09/14/2019  Sandra Mclaughlin was observed post Covid-19 immunization for 15 minutes without incidence. She was provided with Vaccine Information Sheet and instruction to access the V-Safe system.   Sandra Mclaughlin was instructed to call 911 with any severe reactions post vaccine: Marland Kitchen Difficulty breathing  . Swelling of your face and throat  . A fast heartbeat  . A bad rash all over your body  . Dizziness and weakness    Immunizations Administered    Name Date Dose VIS Date Route   Pfizer COVID-19 Vaccine 09/14/2019 10:02 AM 0.3 mL 07/20/2019 Intramuscular   Manufacturer: [employer], [employer]   Lot: [id number]   NDC: [address]-5

## 2019-10-09 ENCOUNTER — Ambulatory Visit: Payer: Medicare Other | Attending: Internal Medicine

## 2019-10-09 DIAGNOSIS — Z23 Encounter for immunization: Secondary | ICD-10-CM | POA: Insufficient documentation

## 2019-10-09 NOTE — Progress Notes (Signed)
   Covid-19 Vaccination Clinic  Name:  Sandra Mclaughlin    MRN: [id number] DOB: 04/27/1931  10/09/2019  Ms. Sandra Mclaughlin was observed post Covid-19 immunization for 15 minutes without incident. She was provided with Vaccine Information Sheet and instruction to access the V-Safe system.   Ms. Sandra Mclaughlin was instructed to call 911 with any severe reactions post vaccine: Marland Kitchen Difficulty breathing  . Swelling of face and throat  . A fast heartbeat  . A bad rash all over body  . Dizziness and weakness   Immunizations Administered    Name Date Dose VIS Date Route   Pfizer COVID-19 Vaccine 10/09/2019 11:03 AM 0.3 mL 07/20/2019 Intramuscular   Manufacturer: [employer], [employer]   Lot: [id number]   NDC: [address]-8

## 2019-10-18 DIAGNOSIS — H04123 Dry eye syndrome of bilateral lacrimal glands: Secondary | ICD-10-CM | POA: Diagnosis not present

## 2019-10-18 DIAGNOSIS — H401133 Primary open-angle glaucoma, bilateral, severe stage: Secondary | ICD-10-CM | POA: Diagnosis not present

## 2019-10-18 DIAGNOSIS — H02053 Trichiasis without entropian right eye, unspecified eyelid: Secondary | ICD-10-CM | POA: Diagnosis not present

## 2019-10-24 DIAGNOSIS — R151 Fecal smearing: Secondary | ICD-10-CM | POA: Diagnosis not present

## 2019-10-24 DIAGNOSIS — K469 Unspecified abdominal hernia without obstruction or gangrene: Secondary | ICD-10-CM | POA: Diagnosis not present

## 2019-10-24 DIAGNOSIS — R159 Full incontinence of feces: Secondary | ICD-10-CM | POA: Diagnosis not present

## 2019-10-24 DIAGNOSIS — N958 Other specified menopausal and perimenopausal disorders: Secondary | ICD-10-CM | POA: Diagnosis not present

## 2019-10-24 DIAGNOSIS — N3941 Urge incontinence: Secondary | ICD-10-CM | POA: Diagnosis not present

## 2019-11-21 DIAGNOSIS — M25562 Pain in left knee: Secondary | ICD-10-CM | POA: Diagnosis not present

## 2019-11-28 DIAGNOSIS — M25562 Pain in left knee: Secondary | ICD-10-CM | POA: Diagnosis not present

## 2019-11-28 DIAGNOSIS — R2689 Other abnormalities of gait and mobility: Secondary | ICD-10-CM | POA: Diagnosis not present

## 2019-12-04 DIAGNOSIS — R2689 Other abnormalities of gait and mobility: Secondary | ICD-10-CM | POA: Diagnosis not present

## 2019-12-04 DIAGNOSIS — M25562 Pain in left knee: Secondary | ICD-10-CM | POA: Diagnosis not present

## 2019-12-07 DIAGNOSIS — R2689 Other abnormalities of gait and mobility: Secondary | ICD-10-CM | POA: Diagnosis not present

## 2019-12-07 DIAGNOSIS — M25562 Pain in left knee: Secondary | ICD-10-CM | POA: Diagnosis not present

## 2019-12-11 DIAGNOSIS — M25562 Pain in left knee: Secondary | ICD-10-CM | POA: Diagnosis not present

## 2019-12-11 DIAGNOSIS — R2689 Other abnormalities of gait and mobility: Secondary | ICD-10-CM | POA: Diagnosis not present

## 2019-12-14 DIAGNOSIS — S63501A Unspecified sprain of right wrist, initial encounter: Secondary | ICD-10-CM | POA: Diagnosis not present

## 2019-12-14 DIAGNOSIS — M25531 Pain in right wrist: Secondary | ICD-10-CM | POA: Diagnosis not present

## 2019-12-24 DIAGNOSIS — M25531 Pain in right wrist: Secondary | ICD-10-CM | POA: Diagnosis not present

## 2020-01-02 DIAGNOSIS — R159 Full incontinence of feces: Secondary | ICD-10-CM | POA: Diagnosis not present

## 2020-01-02 DIAGNOSIS — R152 Fecal urgency: Secondary | ICD-10-CM | POA: Diagnosis not present

## 2020-01-02 DIAGNOSIS — K469 Unspecified abdominal hernia without obstruction or gangrene: Secondary | ICD-10-CM | POA: Diagnosis not present

## 2020-01-02 DIAGNOSIS — N952 Postmenopausal atrophic vaginitis: Secondary | ICD-10-CM | POA: Diagnosis not present

## 2020-02-12 DIAGNOSIS — R109 Unspecified abdominal pain: Secondary | ICD-10-CM | POA: Diagnosis not present

## 2020-02-12 DIAGNOSIS — M810 Age-related osteoporosis without current pathological fracture: Secondary | ICD-10-CM | POA: Diagnosis not present

## 2020-02-12 DIAGNOSIS — I10 Essential (primary) hypertension: Secondary | ICD-10-CM | POA: Diagnosis not present

## 2020-02-12 DIAGNOSIS — N3000 Acute cystitis without hematuria: Secondary | ICD-10-CM | POA: Diagnosis not present

## 2020-02-12 DIAGNOSIS — R350 Frequency of micturition: Secondary | ICD-10-CM | POA: Diagnosis not present

## 2020-02-18 DIAGNOSIS — H0100A Unspecified blepharitis right eye, upper and lower eyelids: Secondary | ICD-10-CM | POA: Diagnosis not present

## 2020-02-18 DIAGNOSIS — H401133 Primary open-angle glaucoma, bilateral, severe stage: Secondary | ICD-10-CM | POA: Diagnosis not present

## 2020-02-18 DIAGNOSIS — H04123 Dry eye syndrome of bilateral lacrimal glands: Secondary | ICD-10-CM | POA: Diagnosis not present

## 2020-03-11 DIAGNOSIS — N3000 Acute cystitis without hematuria: Secondary | ICD-10-CM | POA: Diagnosis not present

## 2020-03-11 DIAGNOSIS — M549 Dorsalgia, unspecified: Secondary | ICD-10-CM | POA: Diagnosis not present

## 2020-03-11 DIAGNOSIS — R3915 Urgency of urination: Secondary | ICD-10-CM | POA: Diagnosis not present

## 2020-03-31 DIAGNOSIS — K5792 Diverticulitis of intestine, part unspecified, without perforation or abscess without bleeding: Secondary | ICD-10-CM | POA: Diagnosis not present

## 2020-04-02 DIAGNOSIS — Z0120 Encounter for dental examination and cleaning without abnormal findings: Secondary | ICD-10-CM | POA: Diagnosis not present

## 2020-04-10 DIAGNOSIS — M545 Low back pain: Secondary | ICD-10-CM | POA: Diagnosis not present

## 2020-04-25 DIAGNOSIS — R109 Unspecified abdominal pain: Secondary | ICD-10-CM | POA: Diagnosis not present

## 2020-04-25 DIAGNOSIS — I10 Essential (primary) hypertension: Secondary | ICD-10-CM | POA: Diagnosis not present

## 2020-06-13 DIAGNOSIS — R3915 Urgency of urination: Secondary | ICD-10-CM | POA: Diagnosis not present

## 2020-06-13 DIAGNOSIS — Z23 Encounter for immunization: Secondary | ICD-10-CM | POA: Diagnosis not present

## 2020-06-13 DIAGNOSIS — N390 Urinary tract infection, site not specified: Secondary | ICD-10-CM | POA: Diagnosis not present

## 2020-06-24 DIAGNOSIS — R109 Unspecified abdominal pain: Secondary | ICD-10-CM | POA: Diagnosis not present

## 2020-06-24 DIAGNOSIS — R6883 Chills (without fever): Secondary | ICD-10-CM | POA: Diagnosis not present

## 2020-06-24 DIAGNOSIS — R8290 Unspecified abnormal findings in urine: Secondary | ICD-10-CM | POA: Diagnosis not present

## 2020-07-02 DIAGNOSIS — M1712 Unilateral primary osteoarthritis, left knee: Secondary | ICD-10-CM | POA: Diagnosis not present

## 2020-07-10 DIAGNOSIS — Z961 Presence of intraocular lens: Secondary | ICD-10-CM | POA: Diagnosis not present

## 2020-07-10 DIAGNOSIS — H43813 Vitreous degeneration, bilateral: Secondary | ICD-10-CM | POA: Diagnosis not present

## 2020-07-10 DIAGNOSIS — H04123 Dry eye syndrome of bilateral lacrimal glands: Secondary | ICD-10-CM | POA: Diagnosis not present

## 2020-07-10 DIAGNOSIS — H401133 Primary open-angle glaucoma, bilateral, severe stage: Secondary | ICD-10-CM | POA: Diagnosis not present

## 2020-08-04 DIAGNOSIS — R350 Frequency of micturition: Secondary | ICD-10-CM | POA: Diagnosis not present

## 2020-08-09 DIAGNOSIS — Z9289 Personal history of other medical treatment: Secondary | ICD-10-CM

## 2020-08-09 HISTORY — DX: Personal history of other medical treatment: Z92.89

## 2020-08-14 DIAGNOSIS — N3000 Acute cystitis without hematuria: Secondary | ICD-10-CM | POA: Diagnosis not present

## 2020-08-14 DIAGNOSIS — R109 Unspecified abdominal pain: Secondary | ICD-10-CM | POA: Diagnosis not present

## 2020-08-14 DIAGNOSIS — M810 Age-related osteoporosis without current pathological fracture: Secondary | ICD-10-CM | POA: Diagnosis not present

## 2020-08-14 DIAGNOSIS — I10 Essential (primary) hypertension: Secondary | ICD-10-CM | POA: Diagnosis not present

## 2020-08-20 DIAGNOSIS — H401133 Primary open-angle glaucoma, bilateral, severe stage: Secondary | ICD-10-CM | POA: Diagnosis not present

## 2020-08-20 DIAGNOSIS — H04123 Dry eye syndrome of bilateral lacrimal glands: Secondary | ICD-10-CM | POA: Diagnosis not present

## 2020-08-20 DIAGNOSIS — H0100A Unspecified blepharitis right eye, upper and lower eyelids: Secondary | ICD-10-CM | POA: Diagnosis not present

## 2020-08-20 DIAGNOSIS — Z961 Presence of intraocular lens: Secondary | ICD-10-CM | POA: Diagnosis not present

## 2020-09-01 DIAGNOSIS — N3090 Cystitis, unspecified without hematuria: Secondary | ICD-10-CM | POA: Diagnosis not present

## 2020-09-01 DIAGNOSIS — R350 Frequency of micturition: Secondary | ICD-10-CM | POA: Diagnosis not present

## 2020-10-01 DIAGNOSIS — R350 Frequency of micturition: Secondary | ICD-10-CM | POA: Diagnosis not present

## 2020-10-01 DIAGNOSIS — R3121 Asymptomatic microscopic hematuria: Secondary | ICD-10-CM | POA: Diagnosis not present

## 2020-10-01 DIAGNOSIS — R8271 Bacteriuria: Secondary | ICD-10-CM | POA: Diagnosis not present

## 2020-11-19 DIAGNOSIS — R350 Frequency of micturition: Secondary | ICD-10-CM | POA: Diagnosis not present

## 2020-11-19 DIAGNOSIS — N815 Vaginal enterocele: Secondary | ICD-10-CM | POA: Diagnosis not present

## 2020-11-19 DIAGNOSIS — R3121 Asymptomatic microscopic hematuria: Secondary | ICD-10-CM | POA: Diagnosis not present

## 2020-12-12 DIAGNOSIS — H401131 Primary open-angle glaucoma, bilateral, mild stage: Secondary | ICD-10-CM | POA: Diagnosis not present

## 2020-12-23 DIAGNOSIS — K5792 Diverticulitis of intestine, part unspecified, without perforation or abscess without bleeding: Secondary | ICD-10-CM | POA: Diagnosis not present

## 2020-12-30 DIAGNOSIS — Z03818 Encounter for observation for suspected exposure to other biological agents ruled out: Secondary | ICD-10-CM | POA: Diagnosis not present

## 2020-12-30 DIAGNOSIS — Z20822 Contact with and (suspected) exposure to covid-19: Secondary | ICD-10-CM | POA: Diagnosis not present

## 2021-01-03 DIAGNOSIS — B370 Candidal stomatitis: Secondary | ICD-10-CM | POA: Diagnosis not present

## 2021-02-11 DIAGNOSIS — G629 Polyneuropathy, unspecified: Secondary | ICD-10-CM | POA: Diagnosis not present

## 2021-02-11 DIAGNOSIS — M810 Age-related osteoporosis without current pathological fracture: Secondary | ICD-10-CM | POA: Diagnosis not present

## 2021-02-11 DIAGNOSIS — R109 Unspecified abdominal pain: Secondary | ICD-10-CM | POA: Diagnosis not present

## 2021-02-11 DIAGNOSIS — Z0000 Encounter for general adult medical examination without abnormal findings: Secondary | ICD-10-CM | POA: Diagnosis not present

## 2021-02-18 DIAGNOSIS — R350 Frequency of micturition: Secondary | ICD-10-CM | POA: Diagnosis not present

## 2021-02-18 DIAGNOSIS — N815 Vaginal enterocele: Secondary | ICD-10-CM | POA: Diagnosis not present

## 2021-02-26 ENCOUNTER — Ambulatory Visit
Admission: RE | Admit: 2021-02-26 | Discharge: 2021-02-26 | Disposition: A | Discharge status: Home / Self Care | Payer: Medicare Other | Source: Ambulatory Visit | Attending: Family Medicine | Admitting: Family Medicine

## 2021-02-26 ENCOUNTER — Other Ambulatory Visit: Payer: Self-pay | Admitting: Family Medicine

## 2021-02-26 DIAGNOSIS — R109 Unspecified abdominal pain: Secondary | ICD-10-CM | POA: Diagnosis not present

## 2021-02-26 DIAGNOSIS — R1084 Generalized abdominal pain: Secondary | ICD-10-CM | POA: Diagnosis not present

## 2021-02-27 ENCOUNTER — Other Ambulatory Visit: Payer: Self-pay | Admitting: Family Medicine

## 2021-02-27 DIAGNOSIS — R109 Unspecified abdominal pain: Secondary | ICD-10-CM

## 2021-03-03 DIAGNOSIS — Z1231 Encounter for screening mammogram for malignant neoplasm of breast: Secondary | ICD-10-CM | POA: Diagnosis not present

## 2021-03-03 DIAGNOSIS — M810 Age-related osteoporosis without current pathological fracture: Secondary | ICD-10-CM | POA: Diagnosis not present

## 2021-03-05 ENCOUNTER — Ambulatory Visit
Admission: RE | Admit: 2021-03-05 | Discharge: 2021-03-05 | Disposition: A | Discharge status: Home / Self Care | Payer: Medicare Other | Source: Ambulatory Visit | Attending: Family Medicine | Admitting: Family Medicine

## 2021-03-05 DIAGNOSIS — N3289 Other specified disorders of bladder: Secondary | ICD-10-CM | POA: Diagnosis not present

## 2021-03-05 DIAGNOSIS — N8189 Other female genital prolapse: Secondary | ICD-10-CM | POA: Diagnosis not present

## 2021-03-05 DIAGNOSIS — R109 Unspecified abdominal pain: Secondary | ICD-10-CM

## 2021-03-05 DIAGNOSIS — N289 Disorder of kidney and ureter, unspecified: Secondary | ICD-10-CM | POA: Diagnosis not present

## 2021-03-05 DIAGNOSIS — K5730 Diverticulosis of large intestine without perforation or abscess without bleeding: Secondary | ICD-10-CM | POA: Diagnosis not present

## 2021-03-05 MED ORDER — IOPAMIDOL (ISOVUE-300) INJECTION 61%
100.0000 mL | Freq: Once | INTRAVENOUS | Status: AC | PRN
  Administered 2021-03-05: 100 mL via INTRAVENOUS

## 2021-03-20 ENCOUNTER — Other Ambulatory Visit: Payer: Self-pay | Admitting: Physician Assistant

## 2021-03-20 DIAGNOSIS — K219 Gastro-esophageal reflux disease without esophagitis: Secondary | ICD-10-CM | POA: Diagnosis not present

## 2021-03-20 DIAGNOSIS — K5904 Chronic idiopathic constipation: Secondary | ICD-10-CM | POA: Diagnosis not present

## 2021-03-20 DIAGNOSIS — R109 Unspecified abdominal pain: Secondary | ICD-10-CM

## 2021-03-20 DIAGNOSIS — R110 Nausea: Secondary | ICD-10-CM

## 2021-03-23 ENCOUNTER — Inpatient Hospital Stay: Admission: RE | Admit: 2021-03-23 | Payer: Medicare Other | Source: Ambulatory Visit

## 2021-03-25 ENCOUNTER — Other Ambulatory Visit: Payer: Medicare Other

## 2021-04-02 ENCOUNTER — Ambulatory Visit: Payer: Self-pay | Admitting: Orthopedic Surgery

## 2021-04-03 DIAGNOSIS — K5904 Chronic idiopathic constipation: Secondary | ICD-10-CM | POA: Diagnosis not present

## 2021-04-03 DIAGNOSIS — R110 Nausea: Secondary | ICD-10-CM | POA: Diagnosis not present

## 2021-04-03 DIAGNOSIS — K219 Gastro-esophageal reflux disease without esophagitis: Secondary | ICD-10-CM | POA: Diagnosis not present

## 2021-04-03 DIAGNOSIS — R109 Unspecified abdominal pain: Secondary | ICD-10-CM | POA: Diagnosis not present

## 2021-04-06 ENCOUNTER — Ambulatory Visit
Admission: RE | Admit: 2021-04-06 | Discharge: 2021-04-06 | Disposition: A | Discharge status: Home / Self Care | Payer: Medicare Other | Source: Ambulatory Visit | Attending: Physician Assistant | Admitting: Physician Assistant

## 2021-04-06 DIAGNOSIS — R110 Nausea: Secondary | ICD-10-CM

## 2021-04-06 DIAGNOSIS — K5710 Diverticulosis of small intestine without perforation or abscess without bleeding: Secondary | ICD-10-CM | POA: Diagnosis not present

## 2021-04-06 DIAGNOSIS — R109 Unspecified abdominal pain: Secondary | ICD-10-CM

## 2021-04-06 DIAGNOSIS — K219 Gastro-esophageal reflux disease without esophagitis: Secondary | ICD-10-CM | POA: Diagnosis not present

## 2021-04-06 DIAGNOSIS — K222 Esophageal obstruction: Secondary | ICD-10-CM | POA: Diagnosis not present

## 2021-04-08 ENCOUNTER — Encounter: Payer: Self-pay | Admitting: Orthopedic Surgery

## 2021-04-09 ENCOUNTER — Other Ambulatory Visit: Payer: Self-pay

## 2021-04-09 ENCOUNTER — Encounter: Payer: Self-pay | Admitting: Orthopedic Surgery

## 2021-04-09 ENCOUNTER — Ambulatory Visit (INDEPENDENT_AMBULATORY_CARE_PROVIDER_SITE_OTHER): Payer: Medicare Other | Admitting: Orthopedic Surgery

## 2021-04-09 VITALS — BP 118/76 | HR 75 | Temp 97.7°F | Ht 61.5 in | Wt 104.0 lb

## 2021-04-09 DIAGNOSIS — H409 Unspecified glaucoma: Secondary | ICD-10-CM

## 2021-04-09 DIAGNOSIS — M5134 Other intervertebral disc degeneration, thoracic region: Secondary | ICD-10-CM

## 2021-04-09 DIAGNOSIS — R110 Nausea: Secondary | ICD-10-CM

## 2021-04-09 DIAGNOSIS — I700 Atherosclerosis of aorta: Secondary | ICD-10-CM

## 2021-04-09 DIAGNOSIS — Z23 Encounter for immunization: Secondary | ICD-10-CM | POA: Diagnosis not present

## 2021-04-09 DIAGNOSIS — I10 Essential (primary) hypertension: Secondary | ICD-10-CM | POA: Diagnosis not present

## 2021-04-09 DIAGNOSIS — R1013 Epigastric pain: Secondary | ICD-10-CM

## 2021-04-09 DIAGNOSIS — R350 Frequency of micturition: Secondary | ICD-10-CM

## 2021-04-09 DIAGNOSIS — M25562 Pain in left knee: Secondary | ICD-10-CM

## 2021-04-09 DIAGNOSIS — M25511 Pain in right shoulder: Secondary | ICD-10-CM

## 2021-04-09 DIAGNOSIS — G8929 Other chronic pain: Secondary | ICD-10-CM

## 2021-04-09 DIAGNOSIS — K5909 Other constipation: Secondary | ICD-10-CM

## 2021-04-09 LAB — POCT URINALYSIS DIPSTICK
Bilirubin, UA: NEGATIVE
Glucose, UA: NEGATIVE
Ketones, UA: NEGATIVE
Nitrite, UA: NEGATIVE
Protein, UA: POSITIVE — AB
Spec Grav, UA: 1.015 (ref 1.010–1.025)
Urobilinogen, UA: 0.2 E.U./dL
pH, UA: 5 (ref 5.0–8.0)

## 2021-04-09 MED ORDER — METOCLOPRAMIDE HCL 5 MG PO TABS: 5.0000 mg | ORAL_TABLET | Freq: Four times a day (QID) | ORAL | 3 refills | Status: DC

## 2021-04-09 MED ORDER — OXYCODONE-ACETAMINOPHEN 5-325 MG PO TABS: 1.0000 | ORAL_TABLET | Freq: Four times a day (QID) | ORAL | 0 refills | Status: DC | PRN

## 2021-04-09 NOTE — Progress Notes (Signed)
Careteam: Patient Care Team: Clovis Riley, L.August Saucer, MD as PCP - General (Family Medicine)  Seen by: Hazle Nordmann, AGNP-C  PLACE OF SERVICE:  [facility]  Advanced Directive information Does Patient Have a Medical Advance Directive?: No, Would patient like information on creating a medical advance directive?: No - Patient declined  Allergies  Allergen Reactions   Ciprofloxacin Other (See Comments)    Patient states I had a terrible reaction, vomiting, nausea, diarrhea, muscle pain    Chief Complaint  Patient presents with   Establish Care    New patient.establish care. Patient c/o abdominal pain x 3 months ago. Discuss alternative to phenergan      HPI: Patient is a 87 y.o. female seen today to establish at [facility].   Previous provider was Dr. Lupe Carney. Family wanted her to switch providers to include geronotology speciality. Medical record requested from previous provider. Originally from [place], 2 children (son/daughter). 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 Lives in single story home with husband, no pets. Still drives.   High blood pressure- unknown when diagnosed. Takes lisinopril daily. Does not add salt to foods.   Constipation- GI physician recommended miralax and benefiber. She started it about 4-5 days ago. Also tried Admits to drinking a lot of water daily.   Left knee- followed by [facility], get knee injections periodically.   Joint pain- includes, left knee/right shoulder/right wrist. Followed by [facility]. Left knee injections periodically.   Low back pain- ongoing, followed by [facility]. Spinal nerve stimulator placed 2017 by Dr. Cherrie Distance [facility].   Abdominal pain-symptoms of abdominal pain began years ago. She has seen many specialists in the past. Pain mainly upper abdomen, sometimes lower. Pain is intermittent, episodes appear to last a few weeks at a time then subside. Pain described as sharp and dull. Current  episode of abdominal pain began 3 months ago and has not subsided. In addition she has been experiencing nausea. She has been given Protonix, gabapentin, phenergan and Percocet to help with symptoms. Gallbladder removed in 1989. Plans to see GI next week.   Urinary frequency- symptoms began in the past 2 days. Denies dysuria or flank pain. She has had UTI in the past. Requesting UA/culture to be drawn.   Past surgeries:  Hysterectomy- 1974  Bladder- 1995- Dr. Wilson Singer  Vaginal prolapse- 1992  Gallbladder- 1989  Hernia repair- 1999  Spinal stimulator- 2017  Past procedures:  Colonoscopy- 2015  Mammogram- 2022  Bone density- 2022  No recent hospitalizations.   Family history:  Father- deceased age 4/ old age  Mother- deceased age 77/ heart   Brother- deceased age 60/ lung cancer  Never smoked Social drinks, but not often No past drug use  Exercise- denies regular exercise routine. Use to walk more, she does all chores.   Vision- hx cataracts surgery and glaucoma, sees eye doc every year. Uses Timolol drops daily. Followed by Dr. Randon Goldsmith, [facility].   Dentist- has cleanings a few times a year, seen by Dr. Anna Genre.   No recent falls, ambulates on her own.   Advanced directives completed- copy requested.     Review of Systems:  Review of Systems  Constitutional:  Negative for chills, fever, malaise/fatigue and weight loss.  HENT:         Dry mouth  Eyes:  Negative for blurred vision and double vision.       Glaucoma, hx of cataracts  Respiratory:  Negative for cough, shortness of breath  and wheezing.   Cardiovascular:  Negative for chest pain and leg swelling.  Gastrointestinal:  Positive for abdominal pain, constipation and nausea. Negative for blood in stool, diarrhea, heartburn and vomiting.       Hemorrhoids  Genitourinary:  Positive for frequency. Negative for dysuria and hematuria.  Musculoskeletal:  Positive for back pain, joint pain and myalgias. Negative  for falls.  Skin: Negative.   Neurological:  Negative for dizziness, tingling, tremors and weakness.  Psychiatric/Behavioral:  Negative for depression and memory loss. The patient is not nervous/anxious and does not have insomnia.    Past Medical History:  Diagnosis Date   History of bone density study 2022   History of colonoscopy 2015   History of CT scan 2022   History of mammogram 2022   Past Surgical History:  Procedure Laterality Date   ABDOMINAL HYSTERECTOMY  1974   APPENDECTOMY     CHOLECYSTECTOMY  1995   HERNIA REPAIR     SPINAL CORD STIMULATOR IMPLANT  2017   VAGINAL PROLAPSE REPAIR  1992   Social History:   reports that she has never smoked. She has never used smokeless tobacco. She reports that she does not drink alcohol and does not use drugs.  Family History  Problem Relation Age of Onset   Heart disease Mother    Lung cancer Brother     Medications: Patient's Medications  New Prescriptions   No medications on file  Previous Medications   CALCIUM CARB-CHOLECALCIFEROL (CALTRATE 600+D3 PO)    Take by mouth daily.   CHOLECALCIFEROL (VITAMIN D-3) 25 MCG (1000 UT) CAPS    Take by mouth daily.   GABAPENTIN (NEURONTIN) 300 MG CAPSULE    Take 300 mg by mouth 2 (two) times daily.   IBUPROFEN (ADVIL) 200 MG TABLET    Take 200 mg by mouth as needed.   LISINOPRIL (ZESTRIL) 10 MG TABLET    Take 10 mg by mouth daily.   MULTIPLE VITAMINS-MINERALS (CENTRUM SILVER PO)    Take 1 tablet by mouth daily.   OXYCODONE-ACETAMINOPHEN (PERCOCET/ROXICET) 5-325 MG TABLET    Take 1 tablet by mouth as directed. 3-4 times daily as needed   PANTOPRAZOLE (PROTONIX) 40 MG TABLET    Take 40 mg by mouth daily.   POLYETHYLENE GLYCOL POWDER (MIRALAX) 17 GM/SCOOP POWDER    Take 1 Container by mouth 2 (two) times daily.   PROMETHAZINE (PHENERGAN) 12.5 MG TABLET    Take 12.5 mg by mouth 4 (four) times daily as needed.   TIMOLOL (TIMOPTIC-XR) 0.5 % OPHTHALMIC GEL-FORMING    Place 1 drop into both  eyes every morning.   VITAMIN B-12 (CYANOCOBALAMIN) 500 MCG TABLET    Take 500 mcg by mouth daily.   VITAMIN C (ASCORBIC ACID) 500 MG TABLET    Take 500 mg by mouth daily.   WHEAT DEXTRIN (BENEFIBER DRINK MIX PO)    Take by mouth 3 (three) times daily as needed.  Modified Medications   No medications on file  Discontinued Medications   ALENDRONATE (FOSAMAX) 70 MG TABLET    Take 70 mg by mouth once a week. tuesday   CEFUROXIME (CEFTIN) 250 MG TABLET    Take 1 tablet (250 mg total) by mouth 2 (two) times daily with a meal.   CIPROFLOXACIN (CIPRO) 500 MG TABLET    Take 1 tablet by mouth 2 (two) times daily.   GLUCOSAMINE-CHONDROITIN 500-400 MG TABLET    Take 1 tablet by mouth 2 (two) times daily.   HYDROCHLOROTHIAZIDE (  HYDRODIURIL) 25 MG TABLET    Take 25 mg by mouth daily.   HYOSCYAMINE (LEVSIN, ANASPAZ) 0.125 MG TABLET    Take 1 tablet by mouth every 4 (four) hours as needed. Abdominal pain   KLOR-CON M10 10 MEQ TABLET    Take 1 tablet by mouth daily.   LINACLOTIDE (LINZESS) 72 MCG CAPSULE    Take 72 mcg by mouth as needed.   LISINOPRIL (ZESTRIL) 10 MG TABLET    Take 10 mg by mouth daily.   OXYCODONE-ACETAMINOPHEN (PERCOCET/ROXICET) 5-325 MG TABLET    Take 1 tablet by mouth every 4 (four) hours as needed.   RANITIDINE (ZANTAC) 150 MG TABLET    Take 150 mg by mouth 2 (two) times daily.   TRAMADOL (ULTRAM) 50 MG TABLET    Take 50 mg by mouth every 6 (six) hours as needed.    Physical Exam:  There were no vitals filed for this visit. There is no height or weight on file to calculate BMI. Wt Readings from Last 3 Encounters:  08/16/15 135 lb (61.2 kg)  09/02/14 134 lb (60.8 kg)    Physical Exam Vitals reviewed.  Constitutional:      General: She is not in acute distress. HENT:     Head: Normocephalic.  Eyes:     General:        Right eye: No discharge.        Left eye: No discharge.  Neck:     Vascular: No carotid bruit.  Cardiovascular:     Rate and Rhythm: Normal rate and  regular rhythm.     Pulses: Normal pulses.     Heart sounds: Normal heart sounds. No murmur heard. Pulmonary:     Effort: Pulmonary effort is normal. No respiratory distress.     Breath sounds: Normal breath sounds. No wheezing.  Abdominal:     General: Bowel sounds are normal. There is no distension.     Palpations: Abdomen is soft.     Tenderness: There is abdominal tenderness.     Comments: Mild tenderness noted over epigastric area  Musculoskeletal:     Cervical back: Normal range of motion.     Right lower leg: No edema.     Left lower leg: No edema.  Lymphadenopathy:     Cervical: No cervical adenopathy.  Skin:    General: Skin is warm and dry.     Capillary Refill: Capillary refill takes less than 2 seconds.  Neurological:     General: No focal deficit present.     Mental Status: She is alert and oriented to person, place, and time.  Psychiatric:        Mood and Affect: Mood normal.        Behavior: Behavior normal.    Labs reviewed: Basic Metabolic Panel: No results for input(s): NA, K, CL, CO2, GLUCOSE, BUN, CREATININE, CALCIUM, MG, PHOS, TSH in the last 8760 hours. Liver Function Tests: No results for input(s): AST, ALT, ALKPHOS, BILITOT, PROT, ALBUMIN in the last 8760 hours. No results for input(s): LIPASE, AMYLASE in the last 8760 hours. No results for input(s): AMMONIA in the last 8760 hours. CBC: No results for input(s): WBC, NEUTROABS, HGB, HCT, MCV, PLT in the last 8760 hours. Lipid Panel: No results for input(s): CHOL, HDL, LDLCALC, TRIG, CHOLHDL, LDLDIRECT in the last 8760 hours. TSH: No results for input(s): TSH in the last 8760 hours. A1C: No results found for: HGBA1C   Assessment/Plan 1. Epigastric pain - followed by GI -  CT abdomen 07/292022- colonic diverticulosis noted - abdomen soft, some tenderness over epigastric area - EDG 2018- results unknown, GI indicated pain may not be related to GI tract dysfunction, but referred pain from thoracic  spine degenerative disease - cont Protonix, gabapentin and oxycodone regimen - oxyCODONE-acetaminophen (PERCOCET/ROXICET) 5-325 MG tablet; Take 1 tablet by mouth every 6 (six) hours as needed for severe pain. 3-4 times daily as needed  Dispense: 120 tablet; Refill: 0 - may need palliative consult in future for symptom management  2. Nausea - intermittent nausea, phenergan unsuccessful  - metoCLOPramide (REGLAN) 5 MG tablet; Take 1 tablet (5 mg total) by mouth 4 (four) times daily.  Dispense: 60 tablet; Refill: 3  3. Need for influenza vaccination - Flu Vaccine QUAD High Dose(Fluad)  4. Primary hypertension - controlled - cont lisinopril  5. Other constipation - Linzess unsuccessful - recently started miralax and benefiber per GI - recommend consistent oral hydration daily - recommend limiting oxycodone usage once abdominal pain subsides  6. Chronic pain of left knee - followed by [facility] - cont injections  7. Chronic right shoulder pain - followed by [facility], noted impingement 09/2020  8. Degenerative disc disease, thoracic - spinal cord stimulator placed 07/16/2016 by Dr. Cherrie DistanceKapural  9. Glaucoma of both eyes, unspecified glaucoma type - followed by Dr. Randon GoldsmithLyles - cont timolol drops to both eyes daily  10. Urinary Frequency - symptoms began 2 days ago - POCT urinalysis dipstick - Urinalysis- 3+ leukocytes, trace protein, 1+ hgb 04/09/2021 - Culture, Urine- recollection recommended - Bactrim 800-160 mg po bid x 7 days  11. Aortic Atherosclerosis - noted on CT abdomen 03/06/2021 - cont lisinopril - lipid panel- future  Labs/tests: Future: MMSE, cbc/diff, cmp, TSH, lipid panel  Total time: 55 minutes. Greater than 50% of total time spendt doing patient education regarding symptom and medication management.   Next appt: 05/14/2021   Hazle NordmannAmy Jocilynn Grade, Juel BurrowAGNP  [facility] [phone]

## 2021-04-10 ENCOUNTER — Telehealth: Payer: Self-pay

## 2021-04-10 ENCOUNTER — Other Ambulatory Visit: Payer: Self-pay | Admitting: Orthopedic Surgery

## 2021-04-10 DIAGNOSIS — R350 Frequency of micturition: Secondary | ICD-10-CM

## 2021-04-10 MED ORDER — SULFAMETHOXAZOLE-TRIMETHOPRIM 800-160 MG PO TABS: 1.0000 | ORAL_TABLET | Freq: Two times a day (BID) | ORAL | 0 refills | Status: DC

## 2021-04-10 NOTE — Telephone Encounter (Signed)
Patient called wanting results from urine culture. I informed her that we had not received them yet and she stated that she is still having lots of pain in the lower abdominal area and would like to know if she could get something for infection now,since our office will be closed on Monday.Please advise.  Message routed to Hazle Nordmann, NP

## 2021-04-10 NOTE — Telephone Encounter (Signed)
Called and informed patient that prescription was sent to pharmacy.

## 2021-04-10 NOTE — Telephone Encounter (Signed)
Prescription for Bactrim sent to [facility] pharmacy.

## 2021-04-11 LAB — URINE CULTURE
MICRO NUMBER:: 12324769
SPECIMEN QUALITY:: ADEQUATE

## 2021-04-11 LAB — URINALYSIS
Bilirubin Urine: NEGATIVE
Glucose, UA: NEGATIVE
Ketones, ur: NEGATIVE
Nitrite: NEGATIVE
Specific Gravity, Urine: 1.014 (ref 1.001–1.035)
pH: 5.5 (ref 5.0–8.0)

## 2021-04-14 DIAGNOSIS — R110 Nausea: Secondary | ICD-10-CM | POA: Diagnosis not present

## 2021-04-14 DIAGNOSIS — K5904 Chronic idiopathic constipation: Secondary | ICD-10-CM | POA: Diagnosis not present

## 2021-04-14 DIAGNOSIS — K219 Gastro-esophageal reflux disease without esophagitis: Secondary | ICD-10-CM | POA: Diagnosis not present

## 2021-04-14 DIAGNOSIS — R109 Unspecified abdominal pain: Secondary | ICD-10-CM | POA: Diagnosis not present

## 2021-04-16 DIAGNOSIS — R110 Nausea: Secondary | ICD-10-CM | POA: Insufficient documentation

## 2021-04-16 DIAGNOSIS — M25511 Pain in right shoulder: Secondary | ICD-10-CM | POA: Insufficient documentation

## 2021-04-16 DIAGNOSIS — H409 Unspecified glaucoma: Secondary | ICD-10-CM | POA: Insufficient documentation

## 2021-04-16 DIAGNOSIS — M5134 Other intervertebral disc degeneration, thoracic region: Secondary | ICD-10-CM | POA: Insufficient documentation

## 2021-04-16 DIAGNOSIS — I700 Atherosclerosis of aorta: Secondary | ICD-10-CM | POA: Insufficient documentation

## 2021-04-16 DIAGNOSIS — M25562 Pain in left knee: Secondary | ICD-10-CM | POA: Insufficient documentation

## 2021-04-16 DIAGNOSIS — I10 Essential (primary) hypertension: Secondary | ICD-10-CM | POA: Insufficient documentation

## 2021-04-16 DIAGNOSIS — K5909 Other constipation: Secondary | ICD-10-CM | POA: Insufficient documentation

## 2021-04-16 DIAGNOSIS — R1013 Epigastric pain: Secondary | ICD-10-CM | POA: Insufficient documentation

## 2021-04-16 DIAGNOSIS — G8929 Other chronic pain: Secondary | ICD-10-CM | POA: Insufficient documentation

## 2021-04-28 DIAGNOSIS — N3000 Acute cystitis without hematuria: Secondary | ICD-10-CM | POA: Diagnosis not present

## 2021-05-14 ENCOUNTER — Other Ambulatory Visit: Payer: Self-pay

## 2021-05-14 ENCOUNTER — Ambulatory Visit (INDEPENDENT_AMBULATORY_CARE_PROVIDER_SITE_OTHER): Payer: Medicare Other | Admitting: Orthopedic Surgery

## 2021-05-14 ENCOUNTER — Encounter: Payer: Self-pay | Admitting: Orthopedic Surgery

## 2021-05-14 ENCOUNTER — Telehealth: Payer: Self-pay | Admitting: *Deleted

## 2021-05-14 VITALS — BP 134/82 | HR 84 | Temp 97.3°F | Ht 61.5 in | Wt 107.5 lb

## 2021-05-14 DIAGNOSIS — R5383 Other fatigue: Secondary | ICD-10-CM

## 2021-05-14 DIAGNOSIS — R1013 Epigastric pain: Secondary | ICD-10-CM

## 2021-05-14 DIAGNOSIS — M25511 Pain in right shoulder: Secondary | ICD-10-CM

## 2021-05-14 DIAGNOSIS — I10 Essential (primary) hypertension: Secondary | ICD-10-CM | POA: Diagnosis not present

## 2021-05-14 DIAGNOSIS — I700 Atherosclerosis of aorta: Secondary | ICD-10-CM | POA: Diagnosis not present

## 2021-05-14 DIAGNOSIS — M25562 Pain in left knee: Secondary | ICD-10-CM

## 2021-05-14 DIAGNOSIS — K5909 Other constipation: Secondary | ICD-10-CM

## 2021-05-14 DIAGNOSIS — G8929 Other chronic pain: Secondary | ICD-10-CM

## 2021-05-14 DIAGNOSIS — R110 Nausea: Secondary | ICD-10-CM

## 2021-05-14 MED ORDER — OXYCODONE-ACETAMINOPHEN 5-325 MG PO TABS: 1.0000 | ORAL_TABLET | Freq: Four times a day (QID) | ORAL | 0 refills | Status: DC | PRN

## 2021-05-14 MED ORDER — LIDOCAINE VISCOUS HCL 2 % MT SOLN: 5.0000 mL | Freq: Three times a day (TID) | OROMUCOSAL | 1 refills | Status: DC | PRN

## 2021-05-14 NOTE — Progress Notes (Signed)
Careteam: Patient Care Team: Octavia Heir, NP as PCP - General (Adult Health Nurse Practitioner)  Seen by: Hazle Nordmann, AGNP-C  PLACE OF SERVICE:  [facility]  Advanced Directive information    Allergies  Allergen Reactions   Ciprofloxacin Other (See Comments)    Patient states I had a terrible reaction, vomiting, nausea, diarrhea, muscle pain    Chief Complaint  Patient presents with   Medical Management of Chronic Issues    Patient presents today for a 5 week follow-up.   Quality Metric Gaps    DEXA scan, zoster and TDAP vaccines     HPI: Patient is a 87 y.o. female seen today for medical management of chronic conditions.   Husband and son present during encounter today.   Abdominal pain improved since increasing oxycodone. She is interested in trying another pain reliever once this episode passes. I have reviewed her past CT scan and EGD reports. Diverticulosis and possible degenerative disc issues noted. She does not think pain stems from spinal issues. She also has a nerve stimulator that was placed a few years ago, by Dr. Cherrie Distance. She has not followed up with him recently. She is not interested is seeing another specialist at this time. She continues to be followed by GI, next appointment soon. I have asked they have note forwarded to out office. Denies dizziness, increased confusion and constipation.   Denies nausea since increasing oxycodone. Does not take Reglan daily.   Urinary frequency resolved from last visit. No new issues.   Dry mouth-began awhile back. She has tried biotene without success. She had tried magic mouthwash in the past and liked it, would like new prescription today.   Intermittent fatigue. More energetic from last visit, but still tired at times.   No recent falls or injuries. Ambulates without device.   MMSE done today, score 30/30, adequate clock and shapes.   Believes she had shingles vaccine in the past few years.   Review of Systems:   Review of Systems  Constitutional:  Negative for chills, fever, malaise/fatigue and weight loss.  HENT: Negative.    Eyes: Negative.   Respiratory:  Negative for cough, shortness of breath and wheezing.   Cardiovascular:  Negative for chest pain and leg swelling.  Gastrointestinal:  Positive for abdominal pain, constipation and nausea. Negative for diarrhea, heartburn and vomiting.  Genitourinary:  Negative for dysuria, frequency and hematuria.  Musculoskeletal:  Positive for back pain. Negative for falls, joint pain and myalgias.  Skin: Negative.   Neurological:  Negative for dizziness, tingling, weakness and headaches.  Psychiatric/Behavioral:  Negative for depression. The patient is nervous/anxious. The patient does not have insomnia.    Past Medical History:  Diagnosis Date   History of bone density study 2022   History of colonoscopy 2015   History of CT scan 2022   History of mammogram 2022   Past Surgical History:  Procedure Laterality Date   ABDOMINAL HYSTERECTOMY  1974   APPENDECTOMY     CHOLECYSTECTOMY  1995   HERNIA REPAIR     SPINAL CORD STIMULATOR IMPLANT  2017   VAGINAL PROLAPSE REPAIR  1992   Social History:   reports that she has never smoked. She has never used smokeless tobacco. She reports that she does not drink alcohol and does not use drugs.  Family History  Problem Relation Age of Onset   Heart disease Mother    Lung cancer Brother     Medications: Patient's Medications  New  Prescriptions   No medications on file  Previous Medications   CALCIUM CARB-CHOLECALCIFEROL (CALTRATE 600+D3 PO)    Take by mouth daily.   CHOLECALCIFEROL (VITAMIN D-3) 25 MCG (1000 UT) CAPS    Take by mouth daily.   GABAPENTIN (NEURONTIN) 300 MG CAPSULE    Take 300 mg by mouth 2 (two) times daily.   IBUPROFEN (ADVIL) 200 MG TABLET    Take 200 mg by mouth as needed.   LISINOPRIL (ZESTRIL) 10 MG TABLET    Take 10 mg by mouth daily.   METOCLOPRAMIDE (REGLAN) 5 MG TABLET     Take 1 tablet (5 mg total) by mouth 4 (four) times daily.   MULTIPLE VITAMINS-MINERALS (CENTRUM SILVER PO)    Take 1 tablet by mouth daily.   OXYCODONE-ACETAMINOPHEN (PERCOCET/ROXICET) 5-325 MG TABLET    Take 1 tablet by mouth every 6 (six) hours as needed for severe pain. 3-4 times daily as needed   PANTOPRAZOLE (PROTONIX) 40 MG TABLET    Take 40 mg by mouth daily.   POLYETHYLENE GLYCOL POWDER (GLYCOLAX/MIRALAX) 17 GM/SCOOP POWDER    Take 1 Container by mouth 2 (two) times daily.   PROMETHAZINE (PHENERGAN) 12.5 MG TABLET    Take 12.5 mg by mouth 4 (four) times daily as needed.   SULFAMETHOXAZOLE-TRIMETHOPRIM (BACTRIM DS) 800-160 MG TABLET    Take 1 tablet by mouth 2 (two) times daily.   TIMOLOL (TIMOPTIC-XR) 0.5 % OPHTHALMIC GEL-FORMING    Place 1 drop into both eyes every morning.   VITAMIN B-12 (CYANOCOBALAMIN) 500 MCG TABLET    Take 500 mcg by mouth daily.   VITAMIN C (ASCORBIC ACID) 500 MG TABLET    Take 500 mg by mouth daily.   WHEAT DEXTRIN (BENEFIBER DRINK MIX PO)    Take by mouth 3 (three) times daily as needed.  Modified Medications   No medications on file  Discontinued Medications   No medications on file    Physical Exam:  Vitals:   05/14/21 0857  Weight: 107 lb 8 oz (48.8 kg)  Height: 5' 1.5" (1.562 m)   Body mass index is 19.98 kg/m. Wt Readings from Last 3 Encounters:  05/14/21 107 lb 8 oz (48.8 kg)  04/09/21 104 lb (47.2 kg)  08/16/15 135 lb (61.2 kg)    Physical Exam Vitals reviewed.  Constitutional:      General: She is not in acute distress. HENT:     Head: Normocephalic.     Nose: Nose normal.     Mouth/Throat:     Lips: Lesions present.     Mouth: Mucous membranes are moist. No oral lesions or angioedema.     Tongue: No lesions.     Palate: No lesions.  Eyes:     General:        Right eye: No discharge.        Left eye: No discharge.  Neck:     Vascular: No carotid bruit.  Cardiovascular:     Rate and Rhythm: Normal rate and regular rhythm.      Pulses: Normal pulses.     Heart sounds: Normal heart sounds. No murmur heard. Pulmonary:     Effort: Pulmonary effort is normal. No respiratory distress.     Breath sounds: Normal breath sounds. No wheezing.  Abdominal:     General: Bowel sounds are normal. There is no distension.     Palpations: Abdomen is soft.     Tenderness: There is no abdominal tenderness.  Musculoskeletal:  Cervical back: Normal range of motion.     Right lower leg: No edema.     Left lower leg: No edema.  Lymphadenopathy:     Cervical: No cervical adenopathy.  Skin:    General: Skin is warm and dry.     Capillary Refill: Capillary refill takes less than 2 seconds.  Neurological:     General: No focal deficit present.     Mental Status: She is alert and oriented to person, place, and time.     Motor: No weakness.     Gait: Gait normal.  Psychiatric:        Mood and Affect: Mood normal.        Behavior: Behavior normal.    Labs reviewed: Basic Metabolic Panel: No results for input(s): NA, K, CL, CO2, GLUCOSE, BUN, CREATININE, CALCIUM, MG, PHOS, TSH in the last 8760 hours. Liver Function Tests: No results for input(s): AST, ALT, ALKPHOS, BILITOT, PROT, ALBUMIN in the last 8760 hours. No results for input(s): LIPASE, AMYLASE in the last 8760 hours. No results for input(s): AMMONIA in the last 8760 hours. CBC: No results for input(s): WBC, NEUTROABS, HGB, HCT, MCV, PLT in the last 8760 hours. Lipid Panel: No results for input(s): CHOL, HDL, LDLCALC, TRIG, CHOLHDL, LDLDIRECT in the last 8760 hours. TSH: No results for input(s): TSH in the last 8760 hours. A1C: No results found for: HGBA1C   Assessment/Plan 1. Epigastric pain - improved since increasing oxycodone - followed by GI- requesting they send next f/u note - goal to try Lyrica when episode subsides - does not want to see another specialist at this time - oxyCODONE-acetaminophen (PERCOCET/ROXICET) 5-325 MG tablet; Take 1 tablet by  mouth every 6 (six) hours as needed for severe pain. 3-4 times daily as needed  Dispense: 120 tablet; Refill: 0  2. Primary hypertension - controlled - cont lisinopril - CBC with Differential/Platelets - CMP  3. Aortic atherosclerosis (HCC) - noted on CT abdomen 02/2021 - Lipid panel  4. Fatigue, unspecified type - feels more energetic from last visit, but tired at times - 3 lbs weight gain in 1 month - TSH  5. Other constipation - d/t narcotic use - LBM today - cont miralax   6. Chronic pain of left knee - followed by [facility] - cont steroid injections  7. Chronic right shoulder pain - noted impingement 09/2020 - cont steroid injections  8. Nausea - resolved after abdominal pain improved - cont Reglan prn  Total time: 36 minutes. Greater than 50% of total time spend doing patient education on symptom management and treatment options on abdominal pain and mouth sore/dryness.    Next appt: 08/13/2021  Hazle Nordmann, Juel Burrow  [facility] [phone]

## 2021-05-14 NOTE — Telephone Encounter (Signed)
John with [employer] [phone] called and stated that they received the Mouth Rinse but wants to know if it should be 1:1:1 equal parts with ingredients.   Please Advise.

## 2021-05-14 NOTE — Telephone Encounter (Signed)
Called and LMOM at pharmacy with Amy's response.  Tried to speak with Jonny Ruiz but phone kept going to a survey and hanging up.

## 2021-05-14 NOTE — Patient Instructions (Signed)
Please have GI specialist send next note to our office.

## 2021-05-14 NOTE — Telephone Encounter (Signed)
Yes 1:1:1 equal parts is fine.

## 2021-05-15 LAB — LIPID PANEL
Cholesterol: 142 mg/dL (ref <200)
HDL: 47 mg/dL — ABNORMAL LOW (ref >=50)
LDL Cholesterol (Calc): 77 mg/dL (calc)
Non-HDL Cholesterol (Calc): 95 mg/dL (calc) (ref <130)
Total CHOL/HDL Ratio: 3 (calc) (ref <5.0)
Triglycerides: 93 mg/dL (ref <150)

## 2021-05-15 LAB — CBC WITH DIFFERENTIAL/PLATELET
Absolute Monocytes: 718 cells/uL (ref 200–950)
Basophils Absolute: 10 cells/uL (ref 0–200)
Basophils Relative: 0.1 %
Eosinophils Absolute: 39 cells/uL (ref 15–500)
Eosinophils Relative: 0.4 %
HCT: 40.3 % (ref 35.0–45.0)
Hemoglobin: 13.1 g/dL (ref 11.7–15.5)
Lymphs Abs: 1775 cells/uL (ref 850–3900)
MCH: 30.6 pg (ref 27.0–33.0)
MCHC: 32.5 g/dL (ref 32.0–36.0)
MCV: 94.2 fL (ref 80.0–100.0)
MPV: 11.5 fL (ref 7.5–12.5)
Monocytes Relative: 7.4 %
Neutro Abs: 7159 cells/uL (ref 1500–7800)
Neutrophils Relative %: 73.8 %
Platelets: 216 10*3/uL (ref 140–400)
RBC: 4.28 10*6/uL (ref 3.80–5.10)
RDW: 11.8 % (ref 11.0–15.0)
Total Lymphocyte: 18.3 %
WBC: 9.7 10*3/uL (ref 3.8–10.8)

## 2021-05-15 LAB — COMPREHENSIVE METABOLIC PANEL
AG Ratio: 1.4 (calc) (ref 1.0–2.5)
ALT: 11 U/L (ref 6–29)
AST: 13 U/L (ref 10–35)
Albumin: 3.8 g/dL (ref 3.6–5.1)
Alkaline phosphatase (APISO): 47 U/L (ref 37–153)
BUN: 13 mg/dL (ref 7–25)
CO2: 25 mmol/L (ref 20–32)
Calcium: 9.4 mg/dL (ref 8.6–10.4)
Chloride: 102 mmol/L (ref 98–110)
Creat: 0.64 mg/dL (ref 0.60–0.95)
Globulin: 2.7 g/dL (calc) (ref 1.9–3.7)
Glucose, Bld: 96 mg/dL (ref 65–99)
Potassium: 3.9 mmol/L (ref 3.5–5.3)
Sodium: 137 mmol/L (ref 135–146)
Total Bilirubin: 0.4 mg/dL (ref 0.2–1.2)
Total Protein: 6.5 g/dL (ref 6.1–8.1)

## 2021-05-15 LAB — TSH: TSH: 4.27 mIU/L (ref 0.40–4.50)

## 2021-06-11 DIAGNOSIS — Z961 Presence of intraocular lens: Secondary | ICD-10-CM | POA: Diagnosis not present

## 2021-06-11 DIAGNOSIS — H401131 Primary open-angle glaucoma, bilateral, mild stage: Secondary | ICD-10-CM | POA: Diagnosis not present

## 2021-06-12 ENCOUNTER — Other Ambulatory Visit: Payer: Self-pay | Admitting: *Deleted

## 2021-06-12 MED ORDER — LISINOPRIL 10 MG PO TABS: 10.0000 mg | ORAL_TABLET | Freq: Every day | ORAL | 1 refills | Status: DC

## 2021-06-12 MED ORDER — GABAPENTIN 300 MG PO CAPS: 300.0000 mg | ORAL_CAPSULE | Freq: Two times a day (BID) | ORAL | 1 refills | Status: AC

## 2021-06-12 NOTE — Telephone Encounter (Signed)
Patient requested refill

## 2021-06-25 ENCOUNTER — Other Ambulatory Visit: Payer: Self-pay

## 2021-06-25 DIAGNOSIS — R1013 Epigastric pain: Secondary | ICD-10-CM

## 2021-06-25 MED ORDER — OXYCODONE-ACETAMINOPHEN 5-325 MG PO TABS: 1.0000 | ORAL_TABLET | Freq: Four times a day (QID) | ORAL | 0 refills | Status: DC | PRN

## 2021-06-25 NOTE — Telephone Encounter (Signed)
RX last refilled on 05/14/2021. No treatment agreement on file, notation made on 08/13/2021 appointment to sign.

## 2021-06-26 DIAGNOSIS — M25562 Pain in left knee: Secondary | ICD-10-CM | POA: Diagnosis not present

## 2021-06-26 DIAGNOSIS — M25561 Pain in right knee: Secondary | ICD-10-CM | POA: Diagnosis not present

## 2021-06-29 DIAGNOSIS — H01004 Unspecified blepharitis left upper eyelid: Secondary | ICD-10-CM | POA: Diagnosis not present

## 2021-06-29 DIAGNOSIS — H02055 Trichiasis without entropian left lower eyelid: Secondary | ICD-10-CM | POA: Diagnosis not present

## 2021-07-09 ENCOUNTER — Other Ambulatory Visit: Payer: Self-pay

## 2021-07-09 ENCOUNTER — Ambulatory Visit (INDEPENDENT_AMBULATORY_CARE_PROVIDER_SITE_OTHER): Payer: Medicare Other | Admitting: Orthopedic Surgery

## 2021-07-09 ENCOUNTER — Encounter: Payer: Self-pay | Admitting: Orthopedic Surgery

## 2021-07-09 VITALS — BP 120/72 | HR 98 | Temp 97.7°F | Resp 16 | Ht 61.5 in | Wt 107.8 lb

## 2021-07-09 DIAGNOSIS — R1013 Epigastric pain: Secondary | ICD-10-CM

## 2021-07-09 DIAGNOSIS — B370 Candidal stomatitis: Secondary | ICD-10-CM | POA: Diagnosis not present

## 2021-07-09 MED ORDER — NYSTATIN 100000 UNIT/ML MT SUSP: 5.0000 mL | Freq: Four times a day (QID) | OROMUCOSAL | 0 refills | Status: DC

## 2021-07-09 NOTE — Progress Notes (Signed)
Careteam: Patient Care Team: Sandra Heir, NP as PCP - General (Adult Health Nurse Practitioner)  Seen by: Hazle Nordmann, AGNP-C  PLACE OF SERVICE:  [facility]  Advanced Directive information Does Patient Have a Medical Advance Directive?: No, Would patient like information on creating a medical advance directive?: No - Patient declined  Allergies  Allergen Reactions   Ciprofloxacin Other (See Comments)    Patient states I had a terrible reaction, vomiting, nausea, diarrhea, muscle pain    Chief Complaint  Patient presents with   Acute Visit    Patient complains of mouth soreness.      HPI: Patient is a 87 y.o. female seen today for acute visit due to mouth sores.   Mouth soreness began a few days ago. She noticed her tongue was coated white and felt tender. She has had thrush in the past, believed to be related to antibiotic use. Denies fevers, chills or dental pain. She has not seen dentist recently. She has not tried any alleviating interventions.   Epigastic pain has subsided. She is not using hydrocodone at this time. Discussed trying Cymbalta/lyrica for chronic abdominal pain. She would like to discuss with son first.      Review of Systems:  Review of Systems  Constitutional:  Negative for chills, fever, malaise/fatigue and weight loss.  HENT:         Mouth/ tongue soreness  Respiratory:  Negative for cough, shortness of breath and wheezing.   Cardiovascular:  Negative for chest pain and leg swelling.  Gastrointestinal:  Negative for abdominal pain.  Psychiatric/Behavioral:  Negative for depression. The patient is not nervous/anxious.    Past Medical History:  Diagnosis Date   History of bone density study 2022   History of colonoscopy 2015   History of CT scan 2022   History of mammogram 2022   Past Surgical History:  Procedure Laterality Date   ABDOMINAL HYSTERECTOMY  1974   APPENDECTOMY     CHOLECYSTECTOMY  1995   HERNIA REPAIR     SPINAL CORD  STIMULATOR IMPLANT  2017   VAGINAL PROLAPSE REPAIR  1992   Social History:   reports that she has never smoked. She has never used smokeless tobacco. She reports that she does not drink alcohol and does not use drugs.  Family History  Problem Relation Age of Onset   Heart disease Mother    Lung cancer Brother     Medications: Patient's Medications  New Prescriptions   No medications on file  Previous Medications   CALCIUM CARB-CHOLECALCIFEROL (CALTRATE 600+D3 PO)    Take by mouth daily.   CHOLECALCIFEROL (VITAMIN D-3) 25 MCG (1000 UT) CAPS    Take by mouth daily.   GABAPENTIN (NEURONTIN) 300 MG CAPSULE    Take 1 capsule (300 mg total) by mouth 2 (two) times daily.   IBUPROFEN (ADVIL) 200 MG TABLET    Take 200 mg by mouth as needed.   LISINOPRIL (ZESTRIL) 10 MG TABLET    Take 1 tablet (10 mg total) by mouth daily.   MAGIC MOUTHWASH (LIDOCAINE, DIPHENHYDRAMINE, ALUM & MAG HYDROXIDE) SUSPENSION    Swish and spit 5 mLs 3 (three) times daily as needed for mouth pain.   MULTIPLE VITAMINS-MINERALS (CENTRUM SILVER PO)    Take 1 tablet by mouth daily.   PANTOPRAZOLE (PROTONIX) 40 MG TABLET    Take 40 mg by mouth daily.   POLYETHYLENE GLYCOL POWDER (GLYCOLAX/MIRALAX) 17 GM/SCOOP POWDER    Take 1 Container by  mouth 2 (two) times daily.   PROMETHAZINE (PHENERGAN) 12.5 MG TABLET    Take 12.5 mg by mouth 4 (four) times daily as needed.   TIMOLOL (TIMOPTIC-XR) 0.5 % OPHTHALMIC GEL-FORMING    Place 1 drop into both eyes every morning.   VITAMIN B-12 (CYANOCOBALAMIN) 500 MCG TABLET    Take 500 mcg by mouth daily.   VITAMIN C (ASCORBIC ACID) 500 MG TABLET    Take 500 mg by mouth daily.  Modified Medications   No medications on file  Discontinued Medications   METOCLOPRAMIDE (REGLAN) 5 MG TABLET    Take 1 tablet (5 mg total) by mouth 4 (four) times daily.   OXYCODONE-ACETAMINOPHEN (PERCOCET/ROXICET) 5-325 MG TABLET    Take 1 tablet by mouth every 6 (six) hours as needed for severe pain. 3-4 times  daily as needed   SULFAMETHOXAZOLE-TRIMETHOPRIM (BACTRIM DS) 800-160 MG TABLET    Take 1 tablet by mouth 2 (two) times daily.   WHEAT DEXTRIN (BENEFIBER DRINK MIX PO)    Take by mouth 3 (three) times daily as needed.    Physical Exam:  Vitals:   07/09/21 1035  BP: 120/72  Pulse: 98  Resp: 16  Temp: 97.7 F (36.5 C)  SpO2: 96%  Weight: 107 lb 12.8 oz (48.9 kg)  Height: 5' 1.5" (1.562 m)   Body mass index is 20.04 kg/m. Wt Readings from Last 3 Encounters:  07/09/21 107 lb 12.8 oz (48.9 kg)  05/14/21 107 lb 8 oz (48.8 kg)  04/09/21 104 lb (47.2 kg)    Physical Exam Vitals reviewed.  Constitutional:      General: She is not in acute distress. HENT:     Head: Normocephalic.     Mouth/Throat:     Mouth: Mucous membranes are dry. No oral lesions.     Dentition: Normal dentition.     Pharynx: Oropharyngeal exudate present. No pharyngeal swelling, posterior oropharyngeal erythema or uvula swelling.     Tonsils: No tonsillar exudate or tonsillar abscesses.     Comments: Small white patches to back of tongue Eyes:     General:        Right eye: No discharge.        Left eye: No discharge.  Neck:     Vascular: No carotid bruit.  Cardiovascular:     Rate and Rhythm: Normal rate and regular rhythm.     Pulses: Normal pulses.     Heart sounds: Normal heart sounds. No murmur heard. Pulmonary:     Effort: Pulmonary effort is normal. No respiratory distress.     Breath sounds: Normal breath sounds. No wheezing.  Abdominal:     General: Bowel sounds are normal. There is no distension.     Palpations: Abdomen is soft.     Tenderness: There is no abdominal tenderness.  Musculoskeletal:     Cervical back: Normal range of motion.  Lymphadenopathy:     Cervical: No cervical adenopathy.  Skin:    General: Skin is warm and dry.  Neurological:     General: No focal deficit present.     Mental Status: She is alert and oriented to person, place, and time.  Psychiatric:        Mood  and Affect: Mood normal.        Behavior: Behavior normal.    Labs reviewed: Basic Metabolic Panel: Recent Labs    05/14/21 0945  NA 137  K 3.9  CL 102  CO2 25  GLUCOSE 96  BUN  13  CREATININE 0.64  CALCIUM 9.4  TSH 4.27   Liver Function Tests: Recent Labs    05/14/21 0945  AST 13  ALT 11  BILITOT 0.4  PROT 6.5   No results for input(s): LIPASE, AMYLASE in the last 8760 hours. No results for input(s): AMMONIA in the last 8760 hours. CBC: Recent Labs    05/14/21 0945  WBC 9.7  NEUTROABS 7,159  HGB 13.1  HCT 40.3  MCV 94.2  PLT 216   Lipid Panel: Recent Labs    05/14/21 0945  CHOL 142  HDL 47*  LDLCALC 77  TRIG 93  CHOLHDL 3.0   TSH: Recent Labs    05/14/21 0945  TSH 4.27   A1C: No results found for: HGBA1C   Assessment/Plan 1. Oral candida - small white patches to back of tongue, otherwise normal exam - nystatin (MYCOSTATIN) 100000 UNIT/ML suspension; Take 5 mLs (500,000 Units total) by mouth 4 (four) times daily.  Dispense: 60 mL; Refill: 0  2. Epigastric pain - chronic, intermittent - not using hydrocodone at this time - discussed trying Cymbalta or lyrica for chronic pain- will discuss with son  Total time: 15 minutes. Greater than 50% of total time spent doing patient education regarding Thrush and medication use.    Next appt: none Tria Noguera Scherry Ran  [facility] [phone]

## 2021-07-09 NOTE — Patient Instructions (Signed)
Advised to try Lyrica or Cymbalta for chronic pain- please discuss with son. Good time to try medicine since she is not hurting.

## 2021-07-10 ENCOUNTER — Telehealth: Payer: Self-pay

## 2021-07-10 ENCOUNTER — Other Ambulatory Visit: Payer: Self-pay | Admitting: Orthopedic Surgery

## 2021-07-10 DIAGNOSIS — B370 Candidal stomatitis: Secondary | ICD-10-CM

## 2021-07-10 MED ORDER — NYSTATIN 100000 UNIT/ML MT SUSP: 5.0000 mL | Freq: Four times a day (QID) | OROMUCOSAL | 1 refills | Status: DC

## 2021-07-10 NOTE — Telephone Encounter (Signed)
Patient was given prescription for thrush and she wants to know if she needs medication for longer than 3 days. Prescription was written for three days.She states last time she was given medication she was given supply for about 10 days. Please advise.  Message routed to Hazle Nordmann, NP

## 2021-07-10 NOTE — Telephone Encounter (Signed)
I will send prescription for more.

## 2021-07-15 DIAGNOSIS — H01004 Unspecified blepharitis left upper eyelid: Secondary | ICD-10-CM | POA: Diagnosis not present

## 2021-07-23 ENCOUNTER — Other Ambulatory Visit: Payer: Self-pay | Admitting: Orthopedic Surgery

## 2021-07-23 ENCOUNTER — Telehealth: Payer: Self-pay | Admitting: *Deleted

## 2021-07-23 DIAGNOSIS — B370 Candidal stomatitis: Secondary | ICD-10-CM

## 2021-07-23 MED ORDER — NYSTATIN 100000 UNIT/ML MT SUSP: 5.0000 mL | Freq: Four times a day (QID) | OROMUCOSAL | 1 refills | Status: DC

## 2021-07-23 NOTE — Telephone Encounter (Signed)
Patient notified and agreed.  

## 2021-07-23 NOTE — Telephone Encounter (Signed)
Patient called and stated that she still has the thrush in her mouth, stated that it is still sore and has completed the Nystatin Mouthwash.   Patient feels like she needs this Mouthwash longer than what's given to her. Stated that she has had just enough for about 9 days.   Patient stated that when she had this in the past they had given her a lot of mouthwash to use for a lot longer than 9-10 days.   Would like another RF on Mouthwash.   Please Advise.

## 2021-07-23 NOTE — Telephone Encounter (Signed)
Bigger bottle ordered. Sorry for inconvenience.

## 2021-08-13 ENCOUNTER — Ambulatory Visit: Payer: Medicare Other | Admitting: Orthopedic Surgery

## 2021-08-19 DIAGNOSIS — R109 Unspecified abdominal pain: Secondary | ICD-10-CM | POA: Diagnosis not present

## 2021-08-19 DIAGNOSIS — I10 Essential (primary) hypertension: Secondary | ICD-10-CM | POA: Diagnosis not present

## 2021-08-19 DIAGNOSIS — M810 Age-related osteoporosis without current pathological fracture: Secondary | ICD-10-CM | POA: Diagnosis not present

## 2021-08-21 DIAGNOSIS — N3000 Acute cystitis without hematuria: Secondary | ICD-10-CM | POA: Diagnosis not present

## 2021-08-26 DIAGNOSIS — R350 Frequency of micturition: Secondary | ICD-10-CM | POA: Diagnosis not present

## 2021-08-26 DIAGNOSIS — R8271 Bacteriuria: Secondary | ICD-10-CM | POA: Diagnosis not present

## 2021-08-30 DIAGNOSIS — I10 Essential (primary) hypertension: Secondary | ICD-10-CM | POA: Diagnosis not present

## 2021-09-02 DIAGNOSIS — I700 Atherosclerosis of aorta: Secondary | ICD-10-CM | POA: Diagnosis not present

## 2021-09-02 DIAGNOSIS — I10 Essential (primary) hypertension: Secondary | ICD-10-CM | POA: Diagnosis not present

## 2021-09-09 DIAGNOSIS — U071 COVID-19: Secondary | ICD-10-CM | POA: Diagnosis not present

## 2021-09-30 ENCOUNTER — Inpatient Hospital Stay (HOSPITAL_COMMUNITY): Payer: Medicare Other

## 2021-09-30 ENCOUNTER — Other Ambulatory Visit: Payer: Self-pay

## 2021-09-30 ENCOUNTER — Emergency Department (HOSPITAL_COMMUNITY): Payer: Medicare Other

## 2021-09-30 ENCOUNTER — Inpatient Hospital Stay (HOSPITAL_COMMUNITY)
Admission: EM | Admit: 2021-09-30 | Discharge: 2021-10-06 | DRG: 481 | Disposition: A | Discharge status: Skilled Nursing Facility | Payer: Medicare Other | Attending: Internal Medicine | Admitting: Internal Medicine

## 2021-09-30 ENCOUNTER — Encounter (HOSPITAL_COMMUNITY): Payer: Self-pay

## 2021-09-30 DIAGNOSIS — R011 Cardiac murmur, unspecified: Secondary | ICD-10-CM | POA: Diagnosis not present

## 2021-09-30 DIAGNOSIS — R609 Edema, unspecified: Secondary | ICD-10-CM | POA: Diagnosis not present

## 2021-09-30 DIAGNOSIS — S72142A Displaced intertrochanteric fracture of left femur, initial encounter for closed fracture: Secondary | ICD-10-CM | POA: Diagnosis not present

## 2021-09-30 DIAGNOSIS — G8929 Other chronic pain: Secondary | ICD-10-CM | POA: Diagnosis present

## 2021-09-30 DIAGNOSIS — S72142D Displaced intertrochanteric fracture of left femur, subsequent encounter for closed fracture with routine healing: Secondary | ICD-10-CM | POA: Diagnosis not present

## 2021-09-30 DIAGNOSIS — M80052A Age-related osteoporosis with current pathological fracture, left femur, initial encounter for fracture: Secondary | ICD-10-CM | POA: Diagnosis not present

## 2021-09-30 DIAGNOSIS — H409 Unspecified glaucoma: Secondary | ICD-10-CM | POA: Diagnosis not present

## 2021-09-30 DIAGNOSIS — R262 Difficulty in walking, not elsewhere classified: Secondary | ICD-10-CM | POA: Diagnosis not present

## 2021-09-30 DIAGNOSIS — D72829 Elevated white blood cell count, unspecified: Secondary | ICD-10-CM | POA: Diagnosis present

## 2021-09-30 DIAGNOSIS — D62 Acute posthemorrhagic anemia: Secondary | ICD-10-CM | POA: Diagnosis not present

## 2021-09-30 DIAGNOSIS — M8080XA Other osteoporosis with current pathological fracture, unspecified site, initial encounter for fracture: Secondary | ICD-10-CM | POA: Diagnosis present

## 2021-09-30 DIAGNOSIS — I9589 Other hypotension: Secondary | ICD-10-CM

## 2021-09-30 DIAGNOSIS — G609 Hereditary and idiopathic neuropathy, unspecified: Secondary | ICD-10-CM | POA: Diagnosis not present

## 2021-09-30 DIAGNOSIS — Z9049 Acquired absence of other specified parts of digestive tract: Secondary | ICD-10-CM

## 2021-09-30 DIAGNOSIS — G709 Myoneural disorder, unspecified: Secondary | ICD-10-CM | POA: Diagnosis not present

## 2021-09-30 DIAGNOSIS — Z79899 Other long term (current) drug therapy: Secondary | ICD-10-CM | POA: Diagnosis not present

## 2021-09-30 DIAGNOSIS — I959 Hypotension, unspecified: Secondary | ICD-10-CM | POA: Diagnosis not present

## 2021-09-30 DIAGNOSIS — A09 Infectious gastroenteritis and colitis, unspecified: Secondary | ICD-10-CM | POA: Insufficient documentation

## 2021-09-30 DIAGNOSIS — N3289 Other specified disorders of bladder: Secondary | ICD-10-CM | POA: Diagnosis not present

## 2021-09-30 DIAGNOSIS — W010XXA Fall on same level from slipping, tripping and stumbling without subsequent striking against object, initial encounter: Secondary | ICD-10-CM | POA: Diagnosis not present

## 2021-09-30 DIAGNOSIS — I10 Essential (primary) hypertension: Secondary | ICD-10-CM | POA: Diagnosis present

## 2021-09-30 DIAGNOSIS — M6281 Muscle weakness (generalized): Secondary | ICD-10-CM | POA: Diagnosis not present

## 2021-09-30 DIAGNOSIS — M810 Age-related osteoporosis without current pathological fracture: Secondary | ICD-10-CM | POA: Diagnosis not present

## 2021-09-30 DIAGNOSIS — Z881 Allergy status to other antibiotic agents status: Secondary | ICD-10-CM | POA: Diagnosis not present

## 2021-09-30 DIAGNOSIS — R2681 Unsteadiness on feet: Secondary | ICD-10-CM | POA: Diagnosis not present

## 2021-09-30 DIAGNOSIS — Z8249 Family history of ischemic heart disease and other diseases of the circulatory system: Secondary | ICD-10-CM

## 2021-09-30 DIAGNOSIS — Z7401 Bed confinement status: Secondary | ICD-10-CM | POA: Diagnosis not present

## 2021-09-30 DIAGNOSIS — Y92009 Unspecified place in unspecified non-institutional (private) residence as the place of occurrence of the external cause: Secondary | ICD-10-CM | POA: Insufficient documentation

## 2021-09-30 DIAGNOSIS — G629 Polyneuropathy, unspecified: Secondary | ICD-10-CM | POA: Diagnosis not present

## 2021-09-30 DIAGNOSIS — U071 COVID-19: Secondary | ICD-10-CM | POA: Diagnosis not present

## 2021-09-30 DIAGNOSIS — Z801 Family history of malignant neoplasm of trachea, bronchus and lung: Secondary | ICD-10-CM

## 2021-09-30 DIAGNOSIS — Z8616 Personal history of COVID-19: Secondary | ICD-10-CM | POA: Diagnosis not present

## 2021-09-30 DIAGNOSIS — R431 Parosmia: Secondary | ICD-10-CM | POA: Diagnosis not present

## 2021-09-30 DIAGNOSIS — I493 Ventricular premature depolarization: Secondary | ICD-10-CM | POA: Diagnosis present

## 2021-09-30 DIAGNOSIS — Z9682 Presence of neurostimulator: Secondary | ICD-10-CM | POA: Diagnosis not present

## 2021-09-30 DIAGNOSIS — D649 Anemia, unspecified: Secondary | ICD-10-CM | POA: Diagnosis not present

## 2021-09-30 DIAGNOSIS — J90 Pleural effusion, not elsewhere classified: Secondary | ICD-10-CM | POA: Diagnosis not present

## 2021-09-30 DIAGNOSIS — Z90710 Acquired absence of both cervix and uterus: Secondary | ICD-10-CM | POA: Diagnosis not present

## 2021-09-30 DIAGNOSIS — R079 Chest pain, unspecified: Secondary | ICD-10-CM | POA: Diagnosis not present

## 2021-09-30 DIAGNOSIS — M80852A Other osteoporosis with current pathological fracture, left femur, initial encounter for fracture: Principal | ICD-10-CM | POA: Diagnosis present

## 2021-09-30 DIAGNOSIS — M1990 Unspecified osteoarthritis, unspecified site: Secondary | ICD-10-CM | POA: Diagnosis present

## 2021-09-30 DIAGNOSIS — R509 Fever, unspecified: Secondary | ICD-10-CM | POA: Diagnosis not present

## 2021-09-30 DIAGNOSIS — R0602 Shortness of breath: Secondary | ICD-10-CM | POA: Diagnosis not present

## 2021-09-30 DIAGNOSIS — W19XXXA Unspecified fall, initial encounter: Secondary | ICD-10-CM

## 2021-09-30 DIAGNOSIS — K5792 Diverticulitis of intestine, part unspecified, without perforation or abscess without bleeding: Secondary | ICD-10-CM | POA: Diagnosis not present

## 2021-09-30 DIAGNOSIS — R9431 Abnormal electrocardiogram [ECG] [EKG]: Secondary | ICD-10-CM | POA: Diagnosis not present

## 2021-09-30 DIAGNOSIS — I700 Atherosclerosis of aorta: Secondary | ICD-10-CM | POA: Diagnosis not present

## 2021-09-30 DIAGNOSIS — G6289 Other specified polyneuropathies: Secondary | ICD-10-CM

## 2021-09-30 DIAGNOSIS — K5730 Diverticulosis of large intestine without perforation or abscess without bleeding: Secondary | ICD-10-CM | POA: Diagnosis not present

## 2021-09-30 DIAGNOSIS — T148XXA Other injury of unspecified body region, initial encounter: Secondary | ICD-10-CM

## 2021-09-30 DIAGNOSIS — M25552 Pain in left hip: Secondary | ICD-10-CM | POA: Diagnosis not present

## 2021-09-30 DIAGNOSIS — Z419 Encounter for procedure for purposes other than remedying health state, unspecified: Secondary | ICD-10-CM

## 2021-09-30 DIAGNOSIS — S7292XD Unspecified fracture of left femur, subsequent encounter for closed fracture with routine healing: Secondary | ICD-10-CM | POA: Diagnosis not present

## 2021-09-30 LAB — COMPREHENSIVE METABOLIC PANEL
ALT: 13 U/L (ref 0–44)
AST: 23 U/L (ref 15–41)
Albumin: 2.8 g/dL — ABNORMAL LOW (ref 3.5–5.0)
Alkaline Phosphatase: 45 U/L (ref 38–126)
Anion gap: 7 (ref 5–15)
BUN: 18 mg/dL (ref 8–23)
CO2: 27 mmol/L (ref 22–32)
Calcium: 8.4 mg/dL — ABNORMAL LOW (ref 8.9–10.3)
Chloride: 99 mmol/L (ref 98–111)
Creatinine, Ser: 0.72 mg/dL (ref 0.44–1.00)
GFR, Estimated: >60 mL/min (ref >60)
Glucose, Bld: 121 mg/dL — ABNORMAL HIGH (ref 70–99)
Potassium: 4.5 mmol/L (ref 3.5–5.1)
Sodium: 133 mmol/L — ABNORMAL LOW (ref 135–145)
Total Bilirubin: 1.2 mg/dL (ref 0.3–1.2)
Total Protein: 5.8 g/dL — ABNORMAL LOW (ref 6.5–8.1)

## 2021-09-30 LAB — CBC
HCT: 30.6 % — ABNORMAL LOW (ref 36.0–46.0)
HCT: 24.6 % — ABNORMAL LOW (ref 36.0–46.0)
Hemoglobin: 9.8 g/dL — ABNORMAL LOW (ref 12.0–15.0)
Hemoglobin: 7.9 g/dL — ABNORMAL LOW (ref 12.0–15.0)
MCH: 29.4 pg (ref 26.0–34.0)
MCH: 29.9 pg (ref 26.0–34.0)
MCHC: 32 g/dL (ref 30.0–36.0)
MCHC: 32.1 g/dL (ref 30.0–36.0)
MCV: 91.9 fL (ref 80.0–100.0)
MCV: 93.2 fL (ref 80.0–100.0)
Platelets: 210 10*3/uL (ref 150–400)
Platelets: 147 10*3/uL — ABNORMAL LOW (ref 150–400)
RBC: 3.33 MIL/uL — ABNORMAL LOW (ref 3.87–5.11)
RBC: 2.64 MIL/uL — ABNORMAL LOW (ref 3.87–5.11)
RDW: 13.2 % (ref 11.5–15.5)
RDW: 13.4 % (ref 11.5–15.5)
WBC: 20.8 10*3/uL — ABNORMAL HIGH (ref 4.0–10.5)
WBC: 16.4 10*3/uL — ABNORMAL HIGH (ref 4.0–10.5)
nRBC: 0 % (ref 0.0–0.2)
nRBC: 0 % (ref 0.0–0.2)

## 2021-09-30 LAB — RESP PANEL BY RT-PCR (FLU A&B, COVID) ARPGX2
Influenza A by PCR: NEGATIVE
Influenza B by PCR: NEGATIVE
SARS Coronavirus 2 by RT PCR: POSITIVE — AB

## 2021-09-30 LAB — POC OCCULT BLOOD, ED: Fecal Occult Bld: NEGATIVE

## 2021-09-30 LAB — ABO/RH: ABO/RH(D): O POS

## 2021-09-30 MED ORDER — ADULT MULTIVITAMIN W/MINERALS CH
1.0000 | ORAL_TABLET | Freq: Every day | ORAL | Status: DC
  Administered 2021-10-01 – 2021-10-06 (×6): 1 via ORAL
  Filled 2021-09-30 (×6): qty 1

## 2021-09-30 MED ORDER — POLYETHYLENE GLYCOL 3350 17 G PO PACK: 17.0000 g | PACK | Freq: Every day | ORAL | Status: DC | PRN

## 2021-09-30 MED ORDER — ACETAMINOPHEN 500 MG PO TABS: 1000.0000 mg | ORAL_TABLET | Freq: Three times a day (TID) | ORAL | Status: DC

## 2021-09-30 MED ORDER — OYSTER SHELL CALCIUM/D3 500-5 MG-MCG PO TABS
1.0000 | ORAL_TABLET | Freq: Every day | ORAL | Status: DC
  Administered 2021-10-01 – 2021-10-06 (×6): 1 via ORAL
  Filled 2021-09-30 (×6): qty 1

## 2021-09-30 MED ORDER — DORZOLAMIDE HCL-TIMOLOL MAL 2-0.5 % OP SOLN
1.0000 [drp] | Freq: Two times a day (BID) | OPHTHALMIC | Status: DC
  Administered 2021-10-02 – 2021-10-06 (×10): 1 [drp] via OPHTHALMIC
  Filled 2021-09-30 (×2): qty 10

## 2021-09-30 MED ORDER — IOHEXOL 350 MG/ML SOLN
80.0000 mL | Freq: Once | INTRAVENOUS | Status: AC | PRN
  Administered 2021-09-30: 80 mL via INTRAVENOUS

## 2021-09-30 MED ORDER — HYDROMORPHONE HCL 1 MG/ML IJ SOLN
0.5000 mg | Freq: Once | INTRAMUSCULAR | Status: AC
  Administered 2021-09-30: 0.5 mg via INTRAVENOUS
  Filled 2021-09-30: qty 1

## 2021-09-30 MED ORDER — GABAPENTIN 300 MG PO CAPS
300.0000 mg | ORAL_CAPSULE | Freq: Two times a day (BID) | ORAL | Status: DC
  Administered 2021-09-30 – 2021-10-06 (×11): 300 mg via ORAL
  Filled 2021-09-30 (×11): qty 1

## 2021-09-30 MED ORDER — HYDROCODONE-ACETAMINOPHEN 5-325 MG PO TABS: 1.0000 | ORAL_TABLET | Freq: Four times a day (QID) | ORAL | Status: DC | PRN

## 2021-09-30 MED ORDER — ONDANSETRON HCL 4 MG/2ML IJ SOLN
4.0000 mg | Freq: Once | INTRAMUSCULAR | Status: AC
  Administered 2021-09-30: 4 mg via INTRAVENOUS
  Filled 2021-09-30: qty 2

## 2021-09-30 MED ORDER — ACETAMINOPHEN 325 MG PO TABS
650.0000 mg | ORAL_TABLET | Freq: Once | ORAL | Status: AC
  Administered 2021-09-30: 650 mg via ORAL
  Filled 2021-09-30: qty 2

## 2021-09-30 MED ORDER — VITAMIN D 25 MCG (1000 UNIT) PO TABS
1000.0000 [IU] | ORAL_TABLET | Freq: Every day | ORAL | Status: DC
  Administered 2021-10-01 – 2021-10-06 (×6): 1000 [IU] via ORAL
  Filled 2021-09-30 (×7): qty 1

## 2021-09-30 MED ORDER — SODIUM CHLORIDE 0.9 % IV BOLUS
500.0000 mL | Freq: Once | INTRAVENOUS | Status: AC
  Administered 2021-09-30: 500 mL via INTRAVENOUS

## 2021-09-30 MED ORDER — SODIUM CHLORIDE 0.9 % IV BOLUS
1000.0000 mL | Freq: Once | INTRAVENOUS | Status: AC
Start: 2021-09-30 — End: 2021-09-30
  Administered 2021-09-30: 1000 mL via INTRAVENOUS

## 2021-09-30 NOTE — ED Triage Notes (Signed)
Pt BIB [facility] from home s/p mechanical fall yesterday c/o L hip pain and unable to bear weight since fall.

## 2021-09-30 NOTE — ED Provider Notes (Addendum)
[facility] EMERGENCY DEPARTMENT Provider Note   CSN: [id number] Arrival date & time: 09/30/21  1545     History  Chief Complaint  Patient presents with   Lytle Michaels    Sandra Mclaughlin is a 88 y.o. female.  Patient s/p fall yesterday. States was outside in yard yesterday, ground uneven, tripped and fell. Pt with acute left hip pain then, moderate, dull, constant, worse w movement. No radicular pain. No numbness/weakness. No fever or chills. No neck or back pain. Denies loc. No headache.  No nv. No numbness/weakness. No anticoag therapy. Denies other pain or injury. States goes to [facility].   The history is provided by the patient, medical records, the spouse and the EMS personnel.  Fall Pertinent negatives include no chest pain, no abdominal pain, no headaches and no shortness of breath.      Home Medications Prior to Admission medications   Medication Sig Start Date End Date Taking? Authorizing Provider  Calcium Carb-Cholecalciferol (CALTRATE 600+D3 PO) Take by mouth daily.    [provider]  Cholecalciferol (VITAMIN D-3) 25 MCG (1000 UT) CAPS Take by mouth daily.    [provider]  gabapentin (NEURONTIN) 300 MG capsule Take 1 capsule (300 mg total) by mouth 2 (two) times daily. 06/12/21   Fargo, Amy E, NP  ibuprofen (ADVIL) 200 MG tablet Take 200 mg by mouth as needed.    [provider]  lisinopril (ZESTRIL) 10 MG tablet Take 1 tablet (10 mg total) by mouth daily. 06/12/21   Yvonna Alanis, NP  magic mouthwash (lidocaine, diphenhydrAMINE, alum & mag hydroxide) suspension Swish and spit 5 mLs 3 (three) times daily as needed for mouth pain. 05/14/21   Fargo, Amy E, NP  Multiple Vitamins-Minerals (CENTRUM SILVER PO) Take 1 tablet by mouth daily.    [provider]  nystatin (MYCOSTATIN) 100000 UNIT/ML suspension Take 5 mLs (500,000 Units total) by mouth 4 (four) times daily. 07/23/21   Fargo, Amy E, NP  pantoprazole (PROTONIX) 40 MG  tablet Take 40 mg by mouth daily.    [provider]  polyethylene glycol powder (GLYCOLAX/MIRALAX) 17 GM/SCOOP powder Take 1 Container by mouth 2 (two) times daily.    [provider]  promethazine (PHENERGAN) 12.5 MG tablet Take 12.5 mg by mouth 4 (four) times daily as needed. 03/31/21   [provider]  timolol (TIMOPTIC-XR) 0.5 % ophthalmic gel-forming Place 1 drop into both eyes every morning. 08/20/14   [provider]  vitamin B-12 (CYANOCOBALAMIN) 500 MCG tablet Take 500 mcg by mouth daily.    [provider]  vitamin C (ASCORBIC ACID) 500 MG tablet Take 500 mg by mouth daily.    [provider]      Allergies    Ciprofloxacin    Review of Systems   Review of Systems  Constitutional:  Negative for fever.  HENT:  Negative for nosebleeds.   Eyes:  Negative for visual disturbance.  Respiratory:  Negative for cough and shortness of breath.   Cardiovascular:  Negative for chest pain.  Gastrointestinal:  Negative for abdominal pain and vomiting.  Genitourinary:  Negative for flank pain.  Musculoskeletal:  Negative for back pain and neck pain.  Skin:  Negative for rash.  Neurological:  Negative for headaches.  Hematological:  Does not bruise/bleed easily.  Psychiatric/Behavioral:  Negative for confusion.    Physical Exam Updated Vital Signs BP 122/62 (BP Location: Right Arm)    Pulse 78    Temp  98.4 F (36.9 C) (Oral)    Resp 14    Ht 1.562 m (5' 1.5")    Wt 49.9 kg    SpO2 97%    BMI 20.45 kg/m  Physical Exam Vitals and nursing note reviewed.  Constitutional:      Appearance: Normal appearance. She is well-developed.  HENT:     Head: Atraumatic.     Nose: Nose normal.     Mouth/Throat:     Mouth: Mucous membranes are moist.  Eyes:     General: No scleral icterus.    Conjunctiva/sclera: Conjunctivae normal.     Pupils: Pupils are equal, round, and reactive to light.  Neck:     Trachea: No tracheal deviation.   Cardiovascular:     Rate and Rhythm: Normal rate and regular rhythm.     Pulses: Normal pulses.     Heart sounds: Normal heart sounds. No murmur heard.   No friction rub. No gallop.  Pulmonary:     Effort: Pulmonary effort is normal. No respiratory distress.     Breath sounds: Normal breath sounds.  Abdominal:     General: Bowel sounds are normal. There is no distension.     Palpations: Abdomen is soft.     Tenderness: There is no abdominal tenderness.  Genitourinary:    Comments: No cva tenderness.  Musculoskeletal:        General: No swelling.     Cervical back: Normal range of motion and neck supple. No rigidity. No muscular tenderness.     Comments: CTLS spine, non tender, aligned, no step off. Tenderness left hip. Mild rotation and shortening. Distal pulses palp. No other focal bony tenderness on bil extremity exam.   Skin:    General: Skin is warm and dry.     Findings: No rash.  Neurological:     Mental Status: She is alert.     Comments: Alert, speech normal. Motor/sens grossly intact bil.   Psychiatric:        Mood and Affect: Mood normal.    ED Results / Procedures / Treatments   Labs (all labs ordered are listed, but only abnormal results are displayed) Results for orders placed or performed during the hospital encounter of 09/30/21  CBC  Result Value Ref Range   WBC 20.8 (H) 4.0 - 10.5 K/uL   RBC 3.33 (L) 3.87 - 5.11 MIL/uL   Hemoglobin 9.8 (L) 12.0 - 15.0 g/dL   HCT 30.6 (L) 36.0 - 46.0 %   MCV 91.9 80.0 - 100.0 fL   MCH 29.4 26.0 - 34.0 pg   MCHC 32.0 30.0 - 36.0 g/dL   RDW 13.2 11.5 - 15.5 %   Platelets 210 150 - 400 K/uL   nRBC 0.0 0.0 - 0.2 %  Comprehensive metabolic panel  Result Value Ref Range   Sodium 133 (L) 135 - 145 mmol/L   Potassium 4.5 3.5 - 5.1 mmol/L   Chloride 99 98 - 111 mmol/L   CO2 27 22 - 32 mmol/L   Glucose, Bld 121 (H) 70 - 99 mg/dL   BUN 18 8 - 23 mg/dL   Creatinine, Ser 0.72 0.44 - 1.00 mg/dL   Calcium 8.4 (L) 8.9 - 10.3  mg/dL   Total Protein 5.8 (L) 6.5 - 8.1 g/dL   Albumin 2.8 (L) 3.5 - 5.0 g/dL   AST 23 15 - 41 U/L   ALT 13 0 - 44 U/L   Alkaline Phosphatase 45 38 - 126 U/L  Total Bilirubin 1.2 0.3 - 1.2 mg/dL   GFR, Estimated >33 >82 mL/min   Anion gap 7 5 - 15  Type and screen [facility]  Result Value Ref Range   ABO/RH(D) O POS    Antibody Screen NEG    Sample Expiration      10/03/2021,2359 Performed at [facility], [address], [place], [place] [address]      EKG EKG Interpretation  Date/Time:  Wednesday September 30 2021 15:58:11 EST Ventricular Rate:  78 PR Interval:  134 QRS Duration: 87 QT Interval:  368 QTC Calculation: 420 R Axis:   55 Text Interpretation: Sinus rhythm Premature ventricular complexes Baseline wander Confirmed by Cathren Laine [id number] on 09/30/2021 4:12:56 PM  Radiology No results found.  Procedures Procedures    Medications Ordered in ED Medications  HYDROmorphone (DILAUDID) injection 0.5 mg (has no administration in time range)  ondansetron (ZOFRAN) injection 4 mg (has no administration in time range)    ED Course/ Medical Decision Making/ A&P                           Medical Decision Making Problems Addressed: Closed intertrochanteric fracture of left hip, initial encounter [facility]): acute illness or injury that poses a threat to life or bodily functions Fall from slip, trip, or stumble, initial encounter: acute illness or injury with systemic symptoms that poses a threat to life or bodily functions Other specified hypotension: acute illness or injury with systemic symptoms that poses a threat to life or bodily functions  Amount and/or Complexity of Data Reviewed Independent Historian:     Details: EMS/family, additional hx. External Data Reviewed: notes. Labs: ordered. Decision-making details documented in ED Course. Radiology: ordered and independent interpretation performed. Decision-making details documented in ED  Course. ECG/medicine tests: ordered and independent interpretation performed. Decision-making details documented in ED Course. Discussion of management or test interpretation with external provider(s): Ortho and hosp consults, discussed pt, labs, imaging.   Risk Prescription drug management. Decision regarding hospitalization.   Iv ns. Continuous pulse ox and cardiac monitoring. Stat labs ordered. Imaging ordered. Ecg.   Reviewed nursing notes and prior charts for additional history. Additional hx from family. External records reviewed. Additional hx from ems.   Labs reviewed/interpreted by me - wbc elev. Pt denies fever or chills. As bp low. Will add ua.   XR reviewed/interpreted by me -  + hip fx. No pna.   Cardiac monitor: sinus rhythm, rate 84.  Dilaudid iv. Zofran iv.   Recheck pt, pain improved.  Additional recheck, blood pressure low, 87/51. No chest pain. No headache. No sob. ECG. Ns bolus iv. Type and screen added to labs. Pt denies fever/chills.   Ortho on call for [facility] (at pt request) consulted re hip fx.  Discussed pt with Dr Shon Baton, on call for [facility] - he indicates admit to medicine, he will have Earney Hamburg see in AM, and decide on definitive ortho plan. He indicates may eat now, but keep npo post mn.   Hospitalists consulted for admission.  CRITICAL CARE RE: left hip fracture, hypotension BP 87/51, fluid resuscitation, parenteral pain control.  Performed by: Suzi Roots Total critical care time: 45 minutes Critical care time was exclusive of separately billable procedures and treating other patients. Critical care was necessary to treat or prevent imminent or life-threatening deterioration. Critical care was time spent personally by me on the following activities: development of treatment plan  with patient and/or surrogate as well as nursing, discussions with consultants, evaluation of patient's response to treatment, examination of patient, obtaining  history from patient or surrogate, ordering and performing treatments and interventions, ordering and review of laboratory studies, ordering and review of radiographic studies, pulse oximetry and re-evaluation of patient's condition.         Final Clinical Impression(s) / ED Diagnoses Final diagnoses:  None    Rx / DC Orders ED Discharge Orders     None            Lajean Saver, MD 09/30/21 [phone]

## 2021-09-30 NOTE — ED Notes (Addendum)
ED TO INPATIENT HANDOFF REPORT  ED Nurse Name and Phone #: Wyvonnia Lora [phone]  S Name/Age/Gender Sandra Mclaughlin 88 y.o. female Room/Bed: 038C/038C  Code Status   Code Status: Partial Code  Home/SNF/Other Home Patient oriented to: self, place, time, and situation Is this baseline? Yes   Triage Complete: Triage complete  Chief Complaint Closed comminuted intertrochanteric fracture of left femur ([place]) [S72.142A]  Triage Note Pt BIB [facility] from home s/p mechanical fall yesterday c/o L hip pain and unable to bear weight since fall.    Allergies Allergies  Allergen Reactions   Ciprofloxacin Diarrhea, Nausea And Vomiting and Other (See Comments)    "I had a terrible reaction, vomiting, nausea, diarrhea, muscle pain...."    Level of Care/Admitting Diagnosis ED Disposition     ED Disposition  Admit   Condition  --   Comment  Hospital Area: [place] [[id number]]  Level of Care: Telemetry Surgical [[id number]]  May admit patient to [facility] or [facility] if equivalent level of care is available:: No  Covid Evaluation: Asymptomatic Screening Protocol (No Symptoms)  Diagnosis: Closed comminuted intertrochanteric fracture of left femur [facility]) [id number]  Admitting Physician: Marcelyn Bruins [id number]  Attending Physician: Marcelyn Bruins [phone]  Estimated length of stay: 3 - 4 days  Certification:: I certify this patient will need inpatient services for at least 2 midnights          B Medical/Surgery History Past Medical History:  Diagnosis Date   History of bone density study 2022   History of colonoscopy 2015   History of CT scan 2022   History of mammogram 2022   Past Surgical History:  Procedure Laterality Date   [place]  2017   [place]     A IV Location/Drains/Wounds Patient Lines/Drains/Airways  Status     Active Line/Drains/Airways     Name Placement date Placement time Site Days   Peripheral IV 09/30/21 18 G Right Antecubital 09/30/21  1650  Antecubital  less than 1            Intake/Output Last 24 hours  Intake/Output Summary (Last 24 hours) at 09/30/2021 2007 Last data filed at 09/30/2021 1826 Gross per 24 hour  Intake 1000 ml  Output --  Net 1000 ml    Labs/Imaging Results for orders placed or performed during the hospital encounter of 09/30/21 (from the past 48 hour(s))  CBC     Status: Abnormal   Collection Time: 09/30/21  4:56 PM  Result Value Ref Range   WBC 20.8 (H) 4.0 - 10.5 K/uL   RBC 3.33 (L) 3.87 - 5.11 MIL/uL   Hemoglobin 9.8 (L) 12.0 - 15.0 g/dL   HCT 30.6 (L) 36.0 - 46.0 %   MCV 91.9 80.0 - 100.0 fL   MCH 29.4 26.0 - 34.0 pg   MCHC 32.0 30.0 - 36.0 g/dL   RDW 13.2 11.5 - 15.5 %   Platelets 210 150 - 400 K/uL   nRBC 0.0 0.0 - 0.2 %    Comment: Performed at [facility], [address] [place], [place] [address]  Comprehensive metabolic panel     Status: Abnormal   Collection Time: 09/30/21  4:56 PM  Result Value Ref Range   Sodium 133 (L) 135 - 145 mmol/L   Potassium 4.5 3.5 -  5.1 mmol/L   Chloride 99 98 - 111 mmol/L   CO2 27 22 - 32 mmol/L   Glucose, Bld 121 (H) 70 - 99 mg/dL    Comment: Glucose reference range applies only to samples taken after fasting for at least 8 hours.   BUN 18 8 - 23 mg/dL   Creatinine, Ser 0.72 0.44 - 1.00 mg/dL   Calcium 8.4 (L) 8.9 - 10.3 mg/dL   Total Protein 5.8 (L) 6.5 - 8.1 g/dL   Albumin 2.8 (L) 3.5 - 5.0 g/dL   AST 23 15 - 41 U/L   ALT 13 0 - 44 U/L   Alkaline Phosphatase 45 38 - 126 U/L   Total Bilirubin 1.2 0.3 - 1.2 mg/dL   GFR, Estimated >60 >60 mL/min    Comment: (NOTE) Calculated using the CKD-EPI Creatinine Equation (2021)    Anion gap 7 5 - 15    Comment: Performed at [facility], [place] [address] [place], [place] [address]  Resp Panel by RT-PCR (Flu A&B, Covid)  Nasopharyngeal Swab     Status: Abnormal   Collection Time: 09/30/21  4:56 PM   Specimen: Nasopharyngeal Swab; Nasopharyngeal(NP) swabs in vial transport medium  Result Value Ref Range   SARS Coronavirus 2 by RT PCR POSITIVE (A) NEGATIVE    Comment: (NOTE) SARS-CoV-2 target nucleic acids are DETECTED.  The SARS-CoV-2 RNA is generally detectable in upper respiratory specimens during the acute phase of infection. Positive results are indicative of the presence of the identified virus, but do not rule out bacterial infection or co-infection with other pathogens not detected by the test. Clinical correlation with patient history and other diagnostic information is necessary to determine patient infection status. The expected result is Negative.  Fact Sheet for Patients: EntrepreneurPulse.com.au  Fact Sheet for Healthcare Providers: IncredibleEmployment.be  This test is not yet approved or cleared by the [place] FDA and  has been authorized for detection and/or diagnosis of SARS-CoV-2 by FDA under an Emergency Use Authorization (EUA).  This EUA will remain in effect (meaning this test can be used) for the duration of  the COVID-19 declaration under Section 564(b)(1) of the A ct, 21 U.S.C. section 360bbb-3(b)(1), unless the authorization is terminated or revoked sooner.     Influenza A by PCR NEGATIVE NEGATIVE   Influenza B by PCR NEGATIVE NEGATIVE    Comment: (NOTE) The Xpert Xpress SARS-CoV-2/FLU/RSV plus assay is intended as an aid in the diagnosis of influenza from Nasopharyngeal swab specimens and should not be used as a sole basis for treatment. Nasal washings and aspirates are unacceptable for Xpert Xpress SARS-CoV-2/FLU/RSV testing.  Fact Sheet for Patients: EntrepreneurPulse.com.au  Fact Sheet for Healthcare Providers: IncredibleEmployment.be  This test is not yet approved or cleared by the  [place] FDA and has been authorized for detection and/or diagnosis of SARS-CoV-2 by FDA under an Emergency Use Authorization (EUA). This EUA will remain in effect (meaning this test can be used) for the duration of the COVID-19 declaration under Section 564(b)(1) of the Act, 21 U.S.C. section 360bbb-3(b)(1), unless the authorization is terminated or revoked.  Performed at [facility], [place] [address] [place], [place] [address]   Type and screen [place]     Status: None   Collection Time: 09/30/21  5:15 PM  Result Value Ref Range   ABO/RH(D) O POS    Antibody Screen NEG    Sample Expiration      10/03/2021,2359 Performed at [facility], [place] [address], [place], [place] [address]   POC occult blood, ED Provider will collect     Status: None   Collection Time: 09/30/21  6:50 PM  Result Value Ref Range   Fecal Occult Bld NEGATIVE NEGATIVE   DG Chest 1 View  Result Date: 09/30/2021 CLINICAL DATA:  fall, pain EXAM: CHEST  1 VIEW.  Patient is rotated. COMPARISON:  None. FINDINGS: Prominent ascending aorta silhouette due to patient rotation. The heart and mediastinal contours are within normal limits. Aortic calcification. No focal consolidation. No pulmonary edema. No pleural effusion. No pneumothorax. No acute osseous abnormality. Couple neural stimulator leads noted overlying the thoracic vertebral bodies. IMPRESSION: 1. No active disease. 2.  Aortic Atherosclerosis (ICD10-I70.0). Electronically Signed   By: Iven Finn M.D.   On: 09/30/2021 16:49   DG HIP UNILAT W OR W/O PELVIS 2-3 VIEWS LEFT  Result Date: 09/30/2021 CLINICAL DATA:  Fall, left hip pain EXAM: DG HIP (WITH OR WITHOUT PELVIS) 2-3V LEFT COMPARISON:  03/05/2021 FINDINGS: Acute comminuted intertrochanteric fracture of the proximal left femur with varus angulation. Mildly displaced lesser trochanteric fragment. No dislocation. Pelvic bony ring appears intact. IMPRESSION: Acute  comminuted intertrochanteric fracture of the proximal left femur. Electronically Signed   By: Davina Poke D.O.   On: 09/30/2021 16:51    Pending Labs Unresulted Labs (From admission, onward)     Start     Ordered   10/01/21 0500  CBC  Tomorrow morning,   R        09/30/21 1837   10/01/21 [id number]  Basic metabolic panel  Tomorrow morning,   R        09/30/21 1837   09/30/21 1851  Iron and TIBC  Add-on,   AD        09/30/21 1850   09/30/21 1851  Ferritin  Add-on,   AD        09/30/21 1850   09/30/21 1851  Vitamin B12  Add-on,   AD        09/30/21 1850   09/30/21 1851  Folate, serum, performed at [facility] lab  Add-on,   AD        09/30/21 1850   09/30/21 1834  CBC  Once,   R        09/30/21 1837   09/30/21 1751  Urinalysis, Routine w reflex microscopic  Once,   STAT        09/30/21 1750   09/30/21 1727  ABO/Rh  Once,   STAT        09/30/21 1727            Vitals/Pain Today's Vitals   09/30/21 1730 09/30/21 1830 09/30/21 1845 09/30/21 1900  BP: 98/81 93/61 123/88 (!) 99/51  Pulse: 70 69 71 73  Resp: 16 13 16 15   Temp:      TempSrc:      SpO2: 100% 100% 96% 98%  Weight:      Height:      PainSc:        Isolation Precautions No active isolations  Medications Medications  gabapentin (NEURONTIN) capsule 300 mg (has no administration in time range)  calcium-vitamin D (OSCAL WITH D) 500-5 MG-MCG per tablet 1 tablet (has no administration in time range)  cholecalciferol (VITAMIN D3) tablet 1,000 Units (has no administration in time range)  multivitamin with minerals tablet 1 tablet (has no administration in time range)  dorzolamide-timolol (COSOPT) 22.3-6.8 MG/ML ophthalmic solution 1 drop (has no  administration in time range)  polyethylene glycol (MIRALAX / GLYCOLAX) packet 17 g (has no administration in time range)  HYDROcodone-acetaminophen (NORCO/VICODIN) 5-325 MG per tablet 1 tablet (has no administration in time range)  acetaminophen (TYLENOL) tablet 1,000 mg  (has no administration in time range)  acetaminophen (TYLENOL) tablet 650 mg (has no administration in time range)  HYDROmorphone (DILAUDID) injection 0.5 mg (0.5 mg Intravenous Given 09/30/21 1654)  ondansetron (ZOFRAN) injection 4 mg (4 mg Intravenous Given 09/30/21 1652)  sodium chloride 0.9 % bolus 1,000 mL (0 mLs Intravenous Stopped 09/30/21 1826)    Mobility walks with assist Low fall risk     R Recommendations: See Admitting Provider Note  Report given to: Adaline Sill RN

## 2021-09-30 NOTE — Progress Notes (Addendum)
Event note  Patient having intermittent hypotension recorded on monitor.  Appears to be asymptomatic.  This is happened earlier in the evening presumably due to opioid administration, responded well to fluid initially.  However patient does have leukocytosis and anemia as per HPI. > Still suspect opioid as a primary source versus an appropriate reading from device.  Reassuringly patient is asymptomatic. - We will give 500 cc bolus - For now we will plan to upgrade patient to progressive unit - We will confirm hemoglobin is not downtrending with repeat CBC, note Hemoccult negative - Requested repeat blood pressure be taken with different cuff and or on different arm - Discontinue all opioids for now, we will continue with scheduled Tylenol  Update > Repeat hemoglobin came back at 7.9 down from 9.8 earlier in the ED.  Likely degree of dilution as white blood cell count and platelets also have decreased however this is a greater drop than I would anticipate with a liter of fluids. > Some concern for possible hematoma, at least this does need to be ruled out.  Discussed with orthopedics who state is less likely in this location but is reasonable to rule out and to contact him if there is any hematoma noted. - Proceed with STAT CT of the pelvis which will include the hip and show any evidence of hematoma.  Attempted to update husband by phone with these changes, but there was no answer.  Message left.  Update #2 > Imaging showed fracture with hemorrhage in the adjacent musculature and edematous enlarged musculature but no evidence of any active bleeding or confluent hematoma. > Blood pressure continues to fluctuate but is largely adequate with MAP greater than 65.  We will continue with IV fluids overnight. - Continue to trend CBC to ensure hemoglobin remains greater than 7. - Transfuse hemoglobin less than 7    Covid Update > Discussed Covid positivity with patient and she stated that she had  already tested positive for COVID twice this month.  This is likely a continuation of those prior positive test and not a new infection.  Likely does not require precautions.

## 2021-09-30 NOTE — ED Notes (Signed)
MD notified of BP drop to 87/51. Verbal order for IVF bolus.

## 2021-09-30 NOTE — H&P (Signed)
History and Physical   Sandra Mclaughlin [id number] DOB: September 10, 1930 DOA: 09/30/2021  PCP: Alroy Dust, L.Marlou Sa, MD   Patient coming from: Home  Chief Complaint: Fall, hip pain  HPI: Sandra Mclaughlin is a 88 y.o. female with medical history significant of hypertension, glaucoma, degenerative disc disease, chronic pain, neuropathy, diverticulosis, colitis presenting after a fall with hip pain.  Patient had a fall yesterday.  She was in outside on an uneven surface and she had what appears to be mechanical fall where she tripped and fell.  She had left hip pain following the event, which was dull and constant and worse with movement.  She had help getting back into her house by a neighbor however unable to walk today so came to the ED for further evaluation.  She does report intermittent urinary frequency and states she has been given a medication to take as needed when this occurs (she is unsure what the name of this medication is).  States she has been taking it for few days.  She denies loss of consciousness with the fall.  She denies fevers, chills, chest pain, shortness of breath, abdominal pain, constipation, diarrhea, nausea, vomiting.  ED Course: Vital signs in the ED initially stable there was a transient episode of borderline hypotension with systolic blood pressure dropping into the 80s after administration of Dilaudid which improved following IV bolus.  Patient was placed on 2 L of oxygen at this time as well.  Lab work-up shows CMP with sodium 133, glucose 121, calcium 8.4, albumin 2.8, protein 5.8.  CBC with leukocytosis to 20.8, hemoglobin 9.8 down from previous of 13.  Rest were panel flu and COVID pending.  Urinalysis and type and screen pending.  Chest x-ray showed no acute abnormality and x-ray of the left hip showed a comminuted intertrochanteric fracture of the left femur.  Orthopedics consulted and patient will be evaluated in the morning, do recommend admission.  Review of Systems: As per  HPI otherwise all other systems reviewed and are negative.  Past Medical History:  Diagnosis Date   History of bone density study 2022   History of colonoscopy 2015   History of CT scan 2022   History of mammogram 2022    Past Surgical History:  Procedure Laterality Date   ABDOMINAL HYSTERECTOMY  [place]     SPINAL CORD STIMULATOR IMPLANT  2017   [place]    Social History  reports that she has never smoked. She has never used smokeless tobacco. She reports that she does not drink alcohol and does not use drugs.  Allergies  Allergen Reactions   Ciprofloxacin Diarrhea, Nausea And Vomiting and Other (See Comments)    "I had a terrible reaction, vomiting, nausea, diarrhea, muscle pain...."    Family History  Problem Relation Age of Onset   Heart disease Mother    Lung cancer Brother   Reviewed on admission  Prior to Admission medications   Medication Sig Start Date End Date Taking? Authorizing Provider  acetaminophen (TYLENOL) 325 MG tablet Take 325-650 mg by mouth every 6 (six) hours as needed for mild pain.   Yes [provider]  amLODipine (NORVASC) 10 MG tablet Take 10 mg by mouth daily.   Yes [provider]  Calcium Carb-Cholecalciferol (CALTRATE 600+D3 PO) Take 1 tablet by mouth daily.   Yes [provider]  Cholecalciferol (VITAMIN D-3) 25 MCG (1000 UT)  CAPS Take 1,000 Units by mouth daily.   Yes [provider]  dorzolamide-timolol (COSOPT) 22.3-6.8 MG/ML ophthalmic solution Place 1 drop into both eyes 2 (two) times daily.   Yes [provider]  gabapentin (NEURONTIN) 300 MG capsule Take 1 capsule (300 mg total) by mouth 2 (two) times daily. 06/12/21  Yes [place], Amy E, NP  ibuprofen (ADVIL) 200 MG tablet Take 200-400 mg by mouth every 6 (six) hours as needed for mild pain or headache.   Yes [provider]  lisinopril (ZESTRIL) 20 MG tablet  Take 20 mg by mouth daily.   Yes [provider]  Multiple Vitamins-Minerals (CENTRUM SILVER PO) Take 1 tablet by mouth daily with breakfast.   Yes [provider]  vitamin B-12 (CYANOCOBALAMIN) 500 MCG tablet Take 500 mcg by mouth daily.   Yes [provider]  vitamin C (ASCORBIC ACID) 500 MG tablet Take 500 mg by mouth daily.   Yes [provider]  lisinopril (ZESTRIL) 10 MG tablet Take 1 tablet (10 mg total) by mouth daily. Patient not taking: Reported on 09/30/2021 06/12/21   Yvonna Alanis, NP  magic mouthwash (lidocaine, diphenhydrAMINE, alum & mag hydroxide) suspension Swish and spit 5 mLs 3 (three) times daily as needed for mouth pain. Patient not taking: Reported on 09/30/2021 05/14/21   Yvonna Alanis, NP  nystatin (MYCOSTATIN) 100000 UNIT/ML suspension Take 5 mLs (500,000 Units total) by mouth 4 (four) times daily. Patient not taking: Reported on 09/30/2021 07/23/21   Yvonna Alanis, NP    Physical Exam: Vitals:   09/30/21 1730 09/30/21 1830 09/30/21 1845 09/30/21 1900  BP: 98/81 93/61 123/88 (!) 99/51  Pulse: 70 69 71 73  Resp: 16 13 16 15   Temp:      TempSrc:      SpO2: 100% 100% 96% 98%  Weight:      Height:        Physical Exam Constitutional:      General: She is not in acute distress.    Appearance: Normal appearance.  HENT:     Head: Normocephalic and atraumatic.     Mouth/Throat:     Mouth: Mucous membranes are moist.     Pharynx: Oropharynx is clear.  Eyes:     Extraocular Movements: Extraocular movements intact.     Pupils: Pupils are equal, round, and reactive to light.  Cardiovascular:     Rate and Rhythm: Normal rate and regular rhythm.     Pulses: Normal pulses.     Heart sounds: Normal heart sounds.  Pulmonary:     Effort: Pulmonary effort is normal. No respiratory distress.     Breath sounds: Normal breath sounds.  Abdominal:     General: Bowel sounds are normal. There is no distension.     Palpations: Abdomen is soft.      Tenderness: There is no abdominal tenderness.  Musculoskeletal:        General: No swelling or deformity.     Comments: Lower extremities neurovascularly intact bilaterally. Left lower extremity shortened and externally rotated Left hip without significant hematoma with some mild-mod swelling  Skin:    General: Skin is warm and dry.  Neurological:     General: No focal deficit present.     Mental Status: Mental status is at baseline.   Labs on Admission: I have personally reviewed following labs and imaging studies  CBC: Recent Labs  Lab 09/30/21 1656  WBC 20.8*  HGB 9.8*  HCT 30.6*  MCV 91.9  PLT [id number]    Basic Metabolic Panel: Recent Labs  Lab 09/30/21 1656  NA 133*  K 4.5  CL 99  CO2 27  GLUCOSE 121*  BUN 18  CREATININE 0.72  CALCIUM 8.4*    GFR: Estimated Creatinine Clearance: 37.6 mL/min (by C-G formula based on SCr of 0.72 mg/dL).  Liver Function Tests: Recent Labs  Lab 09/30/21 1656  AST 23  ALT 13  ALKPHOS 45  BILITOT 1.2  PROT 5.8*  ALBUMIN 2.8*    Urine analysis:    Component Value Date/Time   COLORURINE YELLOW 04/09/2021 1332   APPEARANCEUR TURBID (A) 04/09/2021 1332   LABSPEC 1.014 04/09/2021 1332   PHURINE 5.5 04/09/2021 1332   GLUCOSEU NEGATIVE 04/09/2021 1332   HGBUR 1+ (A) 04/09/2021 1332   BILIRUBINUR Negative 04/09/2021 [place] 04/09/2021 1332   PROTEINUR TRACE (A) 04/09/2021 1332   PROTEINUR Positive (A) 04/09/2021 1244   UROBILINOGEN 0.2 04/09/2021 1244   UROBILINOGEN 0.2 09/02/2014 1336   NITRITE NEGATIVE 04/09/2021 1332   NITRITE Negative 04/09/2021 1244   LEUKOCYTESUR 3+ (A) 04/09/2021 1332   LEUKOCYTESUR Large (3+) (A) 04/09/2021 1244    Radiological Exams on Admission: DG Chest 1 View  Result Date: 09/30/2021 CLINICAL DATA:  fall, pain EXAM: CHEST  1 VIEW.  Patient is rotated. COMPARISON:  None. FINDINGS: Prominent ascending aorta silhouette due to patient rotation. The heart and mediastinal  contours are within normal limits. Aortic calcification. No focal consolidation. No pulmonary edema. No pleural effusion. No pneumothorax. No acute osseous abnormality. Couple neural stimulator leads noted overlying the thoracic vertebral bodies. IMPRESSION: 1. No active disease. 2.  Aortic Atherosclerosis (ICD10-I70.0). Electronically Signed   By: Iven Finn M.D.   On: 09/30/2021 16:49   DG HIP UNILAT W OR W/O PELVIS 2-3 VIEWS LEFT  Result Date: 09/30/2021 CLINICAL DATA:  Fall, left hip pain EXAM: DG HIP (WITH OR WITHOUT PELVIS) 2-3V LEFT COMPARISON:  03/05/2021 FINDINGS: Acute comminuted intertrochanteric fracture of the proximal left femur with varus angulation. Mildly displaced lesser trochanteric fragment. No dislocation. Pelvic bony ring appears intact. IMPRESSION: Acute comminuted intertrochanteric fracture of the proximal left femur. Electronically Signed   By: Davina Poke D.O.   On: 09/30/2021 16:51    EKG: Independently reviewed.  Sinus rhythm at 70 bpm.  Some baseline wander.  PVCs noted.  Assessment/Plan Principal Problem:   Closed comminuted intertrochanteric fracture of left femur (HCC) Active Problems:   Primary hypertension   Glaucoma of both eyes   Osteoporosis with fracture   Peripheral neuropathy   Fall at home, initial encounter   Anemia   Leukocytosis  Fall Left femur fracture > Patient had a fall out in her yard on an uneven surface.  Reportedly tripped and fell.  Left hip pain following the event that did not improve with time had to be helped inside by a neighbor at the time of the fall. > X-ray in the ED showed comminuted intertrochanteric fracture of the left femur. > As below patient also noted to have leukocytosis and anemia that are new possibly incidental anemia, currently no evidence of hematoma. > Orthopedics consulted in the ED and will see the patient in the morning request that she be n.p.o. per EDP. - Monitor on telemetry for now - Appreciate  orthopedics recommendations - We will be careful with opioids considering her blood pressure drop after initial administration and apparent opioid sensitive patient - Evaluation by anesthesia for nerve block in the  morning ordered - Continue with home calcium and vitamin D - Hip fracture protocol, nonweightbearing  Anemia > Hemoglobin incidentally noted to be 9.8 in the ED down from previous baseline of 13 several months ago. > No clear evidence of bleeding, no hematoma from hip fracture - We will continue to trend hemoglobin including a recheck now to ensure no rapid decline (especially in the setting of transient hypotension, though this is likely medication related) - Add on iron studies, B12, folate - Type and screen already ordered in the ED  Leukocytosis > Unclear etiology, possibly reactive in the setting of fall and femur fracture. > Chest x-ray clear.  Urinalysis pending but no symptoms. > Patient denies fevers or chills or other infectious symptoms - Trend fever curve and white count - Follow-up urinalysis  Hypertension - Holding home amlodipine and lisinopril in the setting of transient hypotension  Neuropathy - Continue home gabapentin  Osteoporosis > Now with fracture as above - Continue calcium and vitamin D  Glaucoma - Continue home eyedrops   DVT prophylaxis: SCDs Code Status:   Partial code.  No ACLS (compressions, medications, shock), intubation okay. Family Communication:  Husband updated at bedside. Disposition Plan:   Patient is from:  Home  Anticipated DC to:  Pending clinical course, possibly SNF  Anticipated DC date:  2 to 5 days  Anticipated DC barriers: None  Consults called:  Orthopedics, consulted by EDP Admission status:  Inpatient, telemetry  Severity of Illness: The appropriate patient status for this patient is INPATIENT. Inpatient status is judged to be reasonable and necessary in order to provide the required intensity of service to  ensure the patient's safety. The patient's presenting symptoms, physical exam findings, and initial radiographic and laboratory data in the context of their chronic comorbidities is felt to place them at high risk for further clinical deterioration. Furthermore, it is not anticipated that the patient will be medically stable for discharge from the hospital within 2 midnights of admission.   * I certify that at the point of admission it is my clinical judgment that the patient will require inpatient hospital care spanning beyond 2 midnights from the point of admission due to high intensity of service, high risk for further deterioration and high frequency of surveillance required.Marcelyn Bruins MD [facility]  How to contact the [facility] Attending or Consulting provider [place] or covering provider during after hours [place], for this patient?   Check the care team in [facility] and look for a) attending/consulting [facility] provider listed and b) the [facility] team listed Log into [email or web address] and use [place] universal password to access. If you do not have the password, please contact the hospital operator. Locate the [facility] provider you are looking for under [facility] and page to a number that you can be directly reached. If you still have difficulty reaching the provider, please page the [facility] (Director on Call) for the Hospitalists listed on amion for assistance.  09/30/2021, 7:06 PM

## 2021-09-30 NOTE — ED Notes (Signed)
Dinner tray ordered for pt at this time.

## 2021-09-30 NOTE — ED Notes (Signed)
Portable Xray at bedside.

## 2021-10-01 ENCOUNTER — Inpatient Hospital Stay (HOSPITAL_COMMUNITY): Payer: Medicare Other

## 2021-10-01 ENCOUNTER — Inpatient Hospital Stay (HOSPITAL_COMMUNITY): Payer: Medicare Other | Admitting: Anesthesiology

## 2021-10-01 ENCOUNTER — Encounter (HOSPITAL_COMMUNITY): Payer: Self-pay | Admitting: Internal Medicine

## 2021-10-01 ENCOUNTER — Other Ambulatory Visit: Payer: Self-pay

## 2021-10-01 ENCOUNTER — Encounter (HOSPITAL_COMMUNITY)
Admission: EM | Disposition: A | Discharge status: Skilled Nursing Facility | Payer: Self-pay | Source: Home / Self Care | Attending: Internal Medicine

## 2021-10-01 DIAGNOSIS — M1990 Unspecified osteoarthritis, unspecified site: Secondary | ICD-10-CM

## 2021-10-01 DIAGNOSIS — I10 Essential (primary) hypertension: Secondary | ICD-10-CM

## 2021-10-01 DIAGNOSIS — S72142A Displaced intertrochanteric fracture of left femur, initial encounter for closed fracture: Secondary | ICD-10-CM | POA: Diagnosis not present

## 2021-10-01 DIAGNOSIS — G709 Myoneural disorder, unspecified: Secondary | ICD-10-CM

## 2021-10-01 DIAGNOSIS — D649 Anemia, unspecified: Secondary | ICD-10-CM | POA: Diagnosis not present

## 2021-10-01 HISTORY — PX: INTRAMEDULLARY (IM) NAIL INTERTROCHANTERIC: SHX5875

## 2021-10-01 LAB — IRON AND TIBC
Iron: 18 ug/dL — ABNORMAL LOW (ref 28–170)
Saturation Ratios: 11 % (ref 10.4–31.8)
TIBC: 161 ug/dL — ABNORMAL LOW (ref 250–450)
UIBC: 143 ug/dL

## 2021-10-01 LAB — MRSA NEXT GEN BY PCR, NASAL: MRSA by PCR Next Gen: NOT DETECTED

## 2021-10-01 LAB — BASIC METABOLIC PANEL
Anion gap: 4 — ABNORMAL LOW (ref 5–15)
BUN: 16 mg/dL (ref 8–23)
CO2: 26 mmol/L (ref 22–32)
Calcium: 7.7 mg/dL — ABNORMAL LOW (ref 8.9–10.3)
Chloride: 104 mmol/L (ref 98–111)
Creatinine, Ser: 0.8 mg/dL (ref 0.44–1.00)
GFR, Estimated: >60 mL/min (ref >60)
Glucose, Bld: 91 mg/dL (ref 70–99)
Potassium: 4.1 mmol/L (ref 3.5–5.1)
Sodium: 134 mmol/L — ABNORMAL LOW (ref 135–145)

## 2021-10-01 LAB — VITAMIN B12: Vitamin B-12: 512 pg/mL (ref 180–914)

## 2021-10-01 LAB — PREPARE RBC (CROSSMATCH)

## 2021-10-01 LAB — CBC
HCT: 25.2 % — ABNORMAL LOW (ref 36.0–46.0)
Hemoglobin: 8 g/dL — ABNORMAL LOW (ref 12.0–15.0)
MCH: 29.5 pg (ref 26.0–34.0)
MCHC: 31.7 g/dL (ref 30.0–36.0)
MCV: 93 fL (ref 80.0–100.0)
Platelets: 149 10*3/uL — ABNORMAL LOW (ref 150–400)
RBC: 2.71 MIL/uL — ABNORMAL LOW (ref 3.87–5.11)
RDW: 13.6 % (ref 11.5–15.5)
WBC: 15.5 10*3/uL — ABNORMAL HIGH (ref 4.0–10.5)
nRBC: 0 % (ref 0.0–0.2)

## 2021-10-01 LAB — FOLATE: Folate: 30.3 ng/mL (ref >5.9)

## 2021-10-01 LAB — SURGICAL PCR SCREEN
MRSA, PCR: NEGATIVE
Staphylococcus aureus: NEGATIVE

## 2021-10-01 LAB — FERRITIN: Ferritin: 353 ng/mL — ABNORMAL HIGH (ref 11–307)

## 2021-10-01 SURGERY — FIXATION, FRACTURE, INTERTROCHANTERIC, WITH INTRAMEDULLARY ROD
Anesthesia: Spinal | Laterality: Left

## 2021-10-01 MED ORDER — CEFAZOLIN SODIUM-DEXTROSE 2-4 GM/100ML-% IV SOLN
2.0000 g | INTRAVENOUS | Status: AC
  Administered 2021-10-01: 2 g via INTRAVENOUS

## 2021-10-01 MED ORDER — MENTHOL 3 MG MT LOZG: 1.0000 | LOZENGE | OROMUCOSAL | Status: DC | PRN

## 2021-10-01 MED ORDER — 0.9 % SODIUM CHLORIDE (POUR BTL) OPTIME
TOPICAL | Status: DC | PRN
  Administered 2021-10-01: 1000 mL

## 2021-10-01 MED ORDER — PROPOFOL 10 MG/ML IV BOLUS
INTRAVENOUS | Status: AC
  Filled 2021-10-01: qty 20

## 2021-10-01 MED ORDER — ONDANSETRON HCL 4 MG PO TABS
4.0000 mg | ORAL_TABLET | Freq: Four times a day (QID) | ORAL | Status: DC | PRN
Start: 2021-10-01 — End: 2021-10-06

## 2021-10-01 MED ORDER — ONDANSETRON HCL 4 MG/2ML IJ SOLN
INTRAMUSCULAR | Status: DC | PRN
  Administered 2021-10-01: 4 mg via INTRAVENOUS

## 2021-10-01 MED ORDER — FENTANYL CITRATE (PF) 250 MCG/5ML IJ SOLN
INTRAMUSCULAR | Status: AC
  Filled 2021-10-01: qty 5

## 2021-10-01 MED ORDER — PHENYLEPHRINE 40 MCG/ML (10ML) SYRINGE FOR IV PUSH (FOR BLOOD PRESSURE SUPPORT)
PREFILLED_SYRINGE | INTRAVENOUS | Status: DC | PRN
  Administered 2021-10-01 (×3): 80 ug via INTRAVENOUS

## 2021-10-01 MED ORDER — FENTANYL CITRATE (PF) 250 MCG/5ML IJ SOLN
INTRAMUSCULAR | Status: DC | PRN
  Administered 2021-10-01: 50 ug via INTRAVENOUS

## 2021-10-01 MED ORDER — LACTATED RINGERS IV SOLN: INTRAVENOUS | Status: DC

## 2021-10-01 MED ORDER — TRAMADOL HCL 50 MG PO TABS
50.0000 mg | ORAL_TABLET | Freq: Four times a day (QID) | ORAL | Status: DC | PRN
Start: 2021-10-01 — End: 2021-10-06

## 2021-10-01 MED ORDER — FENTANYL CITRATE (PF) 100 MCG/2ML IJ SOLN
25.0000 ug | INTRAMUSCULAR | Status: DC | PRN
  Administered 2021-10-01 (×3): 25 ug via INTRAVENOUS

## 2021-10-01 MED ORDER — ACETAMINOPHEN 500 MG PO TABS
1000.0000 mg | ORAL_TABLET | Freq: Three times a day (TID) | ORAL | Status: DC
  Administered 2021-10-01 – 2021-10-06 (×15): 1000 mg via ORAL
  Filled 2021-10-01 (×15): qty 2

## 2021-10-01 MED ORDER — PROPOFOL 500 MG/50ML IV EMUL
INTRAVENOUS | Status: DC | PRN
Start: 2021-10-01 — End: 2021-10-01
  Administered 2021-10-01: 50 ug/kg/min via INTRAVENOUS

## 2021-10-01 MED ORDER — METOCLOPRAMIDE HCL 5 MG PO TABS: 5.0000 mg | ORAL_TABLET | Freq: Three times a day (TID) | ORAL | Status: DC | PRN

## 2021-10-01 MED ORDER — BUPIVACAINE IN DEXTROSE 0.75-8.25 % IT SOLN
INTRATHECAL | Status: DC | PRN
  Administered 2021-10-01: 1.4 mL via INTRATHECAL

## 2021-10-01 MED ORDER — METOCLOPRAMIDE HCL 5 MG/ML IJ SOLN: 5.0000 mg | Freq: Three times a day (TID) | INTRAMUSCULAR | Status: DC | PRN

## 2021-10-01 MED ORDER — OXYCODONE HCL 5 MG PO TABS: 5.0000 mg | ORAL_TABLET | Freq: Once | ORAL | Status: DC | PRN

## 2021-10-01 MED ORDER — ONDANSETRON HCL 4 MG/2ML IJ SOLN: 4.0000 mg | Freq: Once | INTRAMUSCULAR | Status: DC | PRN

## 2021-10-01 MED ORDER — SODIUM CHLORIDE 0.9% IV SOLUTION: Freq: Once | INTRAVENOUS | Status: DC

## 2021-10-01 MED ORDER — ENOXAPARIN SODIUM 40 MG/0.4ML IJ SOSY
40.0000 mg | PREFILLED_SYRINGE | INTRAMUSCULAR | Status: DC
  Administered 2021-10-02 – 2021-10-06 (×5): 40 mg via SUBCUTANEOUS
  Filled 2021-10-01 (×5): qty 0.4

## 2021-10-01 MED ORDER — FENTANYL CITRATE (PF) 100 MCG/2ML IJ SOLN
INTRAMUSCULAR | Status: AC
  Filled 2021-10-01: qty 2

## 2021-10-01 MED ORDER — PHENYLEPHRINE HCL-NACL 20-0.9 MG/250ML-% IV SOLN
INTRAVENOUS | Status: DC | PRN
  Administered 2021-10-01: 50 ug/min via INTRAVENOUS

## 2021-10-01 MED ORDER — ORAL CARE MOUTH RINSE: 15.0000 mL | Freq: Once | OROMUCOSAL | Status: AC

## 2021-10-01 MED ORDER — PHENOL 1.4 % MT LIQD: 1.0000 | OROMUCOSAL | Status: DC | PRN

## 2021-10-01 MED ORDER — ACETAMINOPHEN 500 MG PO TABS: 1000.0000 mg | ORAL_TABLET | Freq: Three times a day (TID) | ORAL | Status: DC

## 2021-10-01 MED ORDER — PROPOFOL 10 MG/ML IV BOLUS
INTRAVENOUS | Status: DC | PRN
  Administered 2021-10-01: 20 mg via INTRAVENOUS

## 2021-10-01 MED ORDER — DOCUSATE SODIUM 100 MG PO CAPS
100.0000 mg | ORAL_CAPSULE | Freq: Two times a day (BID) | ORAL | Status: DC
  Administered 2021-10-03: 100 mg via ORAL
  Filled 2021-10-01 (×8): qty 1

## 2021-10-01 MED ORDER — ALBUMIN HUMAN 5 % IV SOLN: INTRAVENOUS | Status: DC | PRN

## 2021-10-01 MED ORDER — CHLORHEXIDINE GLUCONATE 0.12 % MT SOLN
15.0000 mL | Freq: Once | OROMUCOSAL | Status: AC
  Administered 2021-10-01: 15 mL via OROMUCOSAL

## 2021-10-01 MED ORDER — CHLORHEXIDINE GLUCONATE 4 % EX LIQD
60.0000 mL | Freq: Once | CUTANEOUS | Status: DC
  Filled 2021-10-01: qty 60

## 2021-10-01 MED ORDER — OXYCODONE HCL 5 MG/5ML PO SOLN: 5.0000 mg | Freq: Once | ORAL | Status: DC | PRN

## 2021-10-01 MED ORDER — CEFAZOLIN SODIUM-DEXTROSE 2-4 GM/100ML-% IV SOLN
2.0000 g | Freq: Four times a day (QID) | INTRAVENOUS | Status: AC
  Administered 2021-10-01 – 2021-10-02 (×2): 2 g via INTRAVENOUS
  Filled 2021-10-01 (×2): qty 100

## 2021-10-01 MED ORDER — ACETAMINOPHEN 500 MG PO TABS: 500.0000 mg | ORAL_TABLET | Freq: Three times a day (TID) | ORAL | Status: DC

## 2021-10-01 MED ORDER — TRANEXAMIC ACID-NACL 1000-0.7 MG/100ML-% IV SOLN
1000.0000 mg | Freq: Once | INTRAVENOUS | Status: AC
  Administered 2021-10-01: 1000 mg via INTRAVENOUS
  Filled 2021-10-01: qty 100

## 2021-10-01 MED ORDER — LIDOCAINE 2% (20 MG/ML) 5 ML SYRINGE
INTRAMUSCULAR | Status: DC | PRN
  Administered 2021-10-01: 40 mg via INTRAVENOUS

## 2021-10-01 MED ORDER — POVIDONE-IODINE 10 % EX SWAB
2.0000 "application " | Freq: Once | CUTANEOUS | Status: AC
  Administered 2021-10-01: 2 via TOPICAL

## 2021-10-01 MED ORDER — ONDANSETRON HCL 4 MG/2ML IJ SOLN: 4.0000 mg | Freq: Four times a day (QID) | INTRAMUSCULAR | Status: DC | PRN

## 2021-10-01 MED ORDER — SODIUM CHLORIDE 0.9 % IV SOLN: INTRAVENOUS | Status: AC

## 2021-10-01 SURGICAL SUPPLY — 54 items
BAG COUNTER SPONGE SURGICOUNT (BAG) ×2 IMPLANT
BIT DRILL CANN LG 4.3MM (BIT) IMPLANT
BIT DRILL LAG SCREW (DRILL) IMPLANT
BNDG COHESIVE 6X5 TAN STRL LF (GAUZE/BANDAGES/DRESSINGS) IMPLANT
BRUSH SCRUB EZ PLAIN DRY (MISCELLANEOUS) ×4 IMPLANT
COVER PERINEAL POST (MISCELLANEOUS) ×2 IMPLANT
COVER SURGICAL LIGHT HANDLE (MISCELLANEOUS) ×4 IMPLANT
DRAPE C-ARMOR (DRAPES) ×2 IMPLANT
DRAPE HALF SHEET 40X57 (DRAPES) IMPLANT
DRAPE ORTHO SPLIT 77X108 STRL (DRAPES) ×4
DRAPE STERI IOBAN 125X83 (DRAPES) ×2 IMPLANT
DRAPE SURG ORHT 6 SPLT 77X108 (DRAPES) IMPLANT
DRAPE U-SHAPE 47X51 STRL (DRAPES) ×2 IMPLANT
DRESSING MEPILEX FLEX 4X4 (GAUZE/BANDAGES/DRESSINGS) IMPLANT
DRILL BIT CANN LG 4.3MM (BIT) ×2
DRILL LAG SCREW (DRILL) ×2
DRSG EMULSION OIL 3X3 NADH (GAUZE/BANDAGES/DRESSINGS) ×2 IMPLANT
DRSG MEPILEX BORDER 4X4 (GAUZE/BANDAGES/DRESSINGS) ×2 IMPLANT
DRSG MEPILEX BORDER 4X8 (GAUZE/BANDAGES/DRESSINGS) ×2 IMPLANT
DRSG MEPILEX FLEX 4X4 (GAUZE/BANDAGES/DRESSINGS) ×2
ELECT REM PT RETURN 9FT ADLT (ELECTROSURGICAL) ×2
ELECTRODE REM PT RTRN 9FT ADLT (ELECTROSURGICAL) ×1 IMPLANT
GLOVE SRG 8 PF TXTR STRL LF DI (GLOVE) ×1 IMPLANT
GLOVE SURG ENC MOIS LTX SZ8 (GLOVE) ×2 IMPLANT
GLOVE SURG ORTHO LTX SZ7.5 (GLOVE) ×4 IMPLANT
GLOVE SURG UNDER POLY LF SZ7.5 (GLOVE) ×2 IMPLANT
GLOVE SURG UNDER POLY LF SZ8 (GLOVE) ×2
GOWN STRL REUS W/ TWL LRG LVL3 (GOWN DISPOSABLE) ×2 IMPLANT
GOWN STRL REUS W/ TWL XL LVL3 (GOWN DISPOSABLE) ×1 IMPLANT
GOWN STRL REUS W/TWL LRG LVL3 (GOWN DISPOSABLE) ×4
GOWN STRL REUS W/TWL XL LVL3 (GOWN DISPOSABLE) ×2
GUIDEPIN VERSANAIL DSP 3.2X444 (ORTHOPEDIC DISPOSABLE SUPPLIES) ×2 IMPLANT
GUIDEWIRE 3.0X100MM BALL TIP (WIRE) ×1 IMPLANT
HFN LAG SCREW 10.5MM X 85MM (Screw) ×1 IMPLANT
KIT BASIN OR (CUSTOM PROCEDURE TRAY) ×2 IMPLANT
KIT TURNOVER KIT B (KITS) ×2 IMPLANT
MANIFOLD NEPTUNE II (INSTRUMENTS) ×1 IMPLANT
NAIL HIP FRACT 130D 9X180 (Orthopedic Implant) ×1 IMPLANT
NS IRRIG 1000ML POUR BTL (IV SOLUTION) ×2 IMPLANT
PACK GENERAL/GYN (CUSTOM PROCEDURE TRAY) ×2 IMPLANT
PAD ARMBOARD 7.5X6 YLW CONV (MISCELLANEOUS) ×4 IMPLANT
SCREW BONE CORTICAL 5.0X32 (Screw) ×1 IMPLANT
STAPLER VISISTAT 35W (STAPLE) ×2 IMPLANT
STOCKINETTE IMPERVIOUS LG (DRAPES) IMPLANT
SUT ETHILON 2 0 FS 18 (SUTURE) ×4 IMPLANT
SUT VIC AB 0 CT1 27 (SUTURE) ×2
SUT VIC AB 0 CT1 27XBRD ANBCTR (SUTURE) ×1 IMPLANT
SUT VIC AB 1 CT1 27 (SUTURE) ×2
SUT VIC AB 1 CT1 27XBRD ANBCTR (SUTURE) ×1 IMPLANT
SUT VIC AB 2-0 CT1 27 (SUTURE) ×2
SUT VIC AB 2-0 CT1 TAPERPNT 27 (SUTURE) ×1 IMPLANT
TOWEL GREEN STERILE (TOWEL DISPOSABLE) ×4 IMPLANT
TOWEL GREEN STERILE FF (TOWEL DISPOSABLE) ×2 IMPLANT
WATER STERILE IRR 1000ML POUR (IV SOLUTION) ×2 IMPLANT

## 2021-10-01 NOTE — Anesthesia Procedure Notes (Signed)
Spinal  Patient location during procedure: OR Start time: 10/01/2021 12:23 PM End time: 10/01/2021 12:29 PM Reason for block: surgical anesthesia Staffing Performed: anesthesiologist  Anesthesiologist: Mal Amabile, MD Preanesthetic Checklist Completed: patient identified, IV checked, site marked, risks and benefits discussed, surgical consent, monitors and equipment checked, pre-op evaluation and timeout performed Spinal Block Patient position: left lateral decubitus Prep: DuraPrep and site prepped and draped Patient monitoring: heart rate, cardiac monitor, continuous pulse ox and blood pressure Approach: midline Location: L3-4 Injection technique: single-shot Needle Needle type: Pencan  Needle gauge: 24 G Needle length: 9 cm Needle insertion depth: 7 cm Assessment Sensory level: T4 Events: CSF return Additional Notes Patient tolerated procedure well. Adequate sensory level. Attempt x 3

## 2021-10-01 NOTE — Assessment & Plan Note (Signed)
>   Now with fracture as above - Continue calcium and vitamin D

## 2021-10-01 NOTE — Assessment & Plan Note (Signed)
-   Holding home amlodipine and lisinopril in the setting of transient hypotension

## 2021-10-01 NOTE — Progress Notes (Signed)
°  Progress Note   Patient: Sandra Mclaughlin [id number] DOB: 05-23-1931 DOA: 09/30/2021     1 DOS: the patient was seen and examined on 10/01/2021   Brief hospital course: Sandra Mclaughlin is a 88 y.o. female with medical history significant of hypertension, glaucoma, degenerative disc disease, chronic pain, neuropathy, diverticulosis, colitis presenting after a fall with hip pain.   Found to have a left femur fracture.  Plan for the OR on 2/23.  Assessment and Plan: * Closed comminuted intertrochanteric fracture of left femur ([place])- (present on admission) Fall- mechanical in her yard on an uneven surface.   > X-ray in the ED showed comminuted intertrochanteric fracture of the left femur. -orthopedic consult -plan for surgery on 2/23 -in a progressive care bed - careful with opioids considering her blood pressure drop after initial administration and apparent opioid sensitive patient - Evaluation by anesthesia for nerve block in the morning ordered - Continue with home calcium and vitamin D   Leukocytosis > Unclear etiology, possibly reactive in the setting of fall and femur fracture. > Chest x-ray clear.  Urinalysis pending but no symptoms. > Patient denies fevers or chills or other infectious symptoms - Trend fever curve and white count - Follow-up urinalysis  Anemia > Hemoglobin incidentally noted to be 9.8 in the ED down from previous baseline of 13 several months ago. > No clear evidence of bleeding, no hematoma from hip fracture - Type and screen already ordered in the ED- plan to give 1 unit PRBC prior to surgery and monitor closely after  Peripheral neuropathy - Continue home gabapentin  Osteoporosis with fracture- (present on admission) > Now with fracture as above - Continue calcium and vitamin D  Glaucoma of both eyes- (present on admission) - Continue home eyedrops  Primary hypertension- (present on admission) - Holding home amlodipine and lisinopril in the setting of  transient hypotension      Subjective: no complaints except some hip pain   Physical Exam: Vitals:   09/30/21 2248 10/01/21 0136 10/01/21 0631 10/01/21 0856  BP: (!) 104/50 99/86 90/68  (!) 114/52  Pulse: 85  74 85  Resp: 16  15 18   Temp: 98.6 F (37 C)   98.2 F (36.8 C)  TempSrc: Oral   Oral  SpO2: 99%  100% 99%  Weight:      Height:        General: Appearance:    Well developed, well nourished female in no acute distress     Lungs:     respirations unlabored  Heart:    Normal heart rate. ? Murmur- AS?  Could be worsened from anemia   MS:   All extremities are intact.    Neurologic:   Awake, alert, oriented x 3     Data Reviewed: Hgb 8 Na 134 but up from 134  Family Communication: at bedside  Disposition: Status is: Inpatient Remains inpatient appropriate because: needs surgery          Planned Discharge Destination: Skilled nursing facility- PT/OT eval for after surgery   Time spent: 65 minutes  Author: Geradine Girt, DO 10/01/2021 10:28 AM  For on call review [email or web address].

## 2021-10-01 NOTE — Assessment & Plan Note (Addendum)
>   Hemoglobin incidentally noted to be 9.8 in the ED down from previous baseline of 13 several months ago. > No clear evidence of bleeding, no hematoma from hip fracture - Type and screen already ordered in the ED- plan to give 1 unit PRBC prior to surgery 2/23 -another unit 2/24 -total of 2 units

## 2021-10-01 NOTE — Op Note (Signed)
10/01/2021  2:30 PM  PATIENT:  Sandra Mclaughlin  September 06, 1930 female   MEDICAL RECORD NUMBER: [id number]  PRE-OPERATIVE DIAGNOSIS:  LEFT COMMINUTED INTERTROCHANTERIC HIP FRACTURE  POST-OPERATIVE DIAGNOSIS:  LEFT COMMINUTED INTERTROCHANTERIC HIP FRACTURE  PROCEDURE:  INTRAMEDULLARY NAILING OF THE LEFT HIP using a [employer] nail, 9 X 180 MM STATICALLY LOCKED.  SURGEON:  Doralee Albino. Carola Frost, M.D.  ASSISTANT:  1. Montez Morita, PA-C.; 2. PA Student  ANESTHESIA:  Spinal block.  COMPLICATIONS:  None.  ESTIMATED BLOOD LOSS:  Less than 150 mL.  DISPOSITION:  To PACU.  CONDITION:  Stable.  DELAY START OF DVT PROPHYLAXIS BECAUSE OF BLEEDING RISK: NO  BRIEF SUMMARY AND INDICATION OF PROCEDURE:  [name] is a 88 y.o. year-old with overall good health who sustained a left hip fracture in a ground level fall.  I discussed with her the risks and benefits of surgical treatment including the potential for malunion, nonunion, symptomatic hardware, heart attack, stroke, neurovascular injury, bleeding, and others.  After full discussion, the patient provided consent to proceed.  BRIEF SUMMARY OF PROCEDURE:  The patient was taken to the operating room where she received a spinal nerve block with excellent anesthesia was induced.  She was positioned supine on the Hana fracture table.  A closed reduction maneuver was performed of the fractured proximal femur and this was confirmed on both AP and lateral xray views. A thorough scrub and wash with chlorhexidine and then Betadine scrub and paint was performed.  After sterile drapes and time-out, a long instrument was used to identify the appropriate starting position under C-arm on both AP and lateral images.  A 3 cm incision was made proximal to the greater trochanter.  The curved cannulated awl was inserted just medial to the tip of the lateral trochanter and then the starting guidewire advanced into the proximal femur.  This was checked on AP and lateral views.   The starting reamer was engaged with the soft tissue protected by a sleeve.  The curved ball-tipped guidewire was then inserted, making sure it was just posterior as possible in the distal femur and across the fracture site, which stayed in a reduced position. It was sequentially reamed up to 11 mm distally, encountering chatter at 10 mm, and an 9 x 180 mm short nail inserted to the appropriate depth.  The guidewire for the lag screw was then inserted with the appropriate anteversion to make sure it was in a center-center position. A second pin was placed to prevent rotation of the head and neck segment during lag screw placement. After measurement the lag screw was placed with excellent purchase and position checked on both views.  The set screw was then engaged within the groove of the lag screw, which was allowed to telescope.  Traction was released and compression achieved using the compression device over the screw.  This was followed by placement of one distal locking screw off the jig.  This was confirmed on AP and lateral images. Wounds were irrigated thoroughly, closed in a standard layered fashion. Sterile gently compressive dressings were applied.  Montez Morita, PA-C, assisted throughout.  The patient was awakened from anesthesia and transported to the PACU in stable condition.  PROGNOSIS:  The patient will be weightbearing as tolerated with physical therapy beginning DVT prophylaxis with Lovenox as soon as deemed stable by the Primary Care Service.  She has no range of motion precautions.  We will continue to follow while at the hospital.  Anticipate follow up  in the office in 2 weeks for removal of sutures and further evaluation.     Doralee Albino. Carola Frost, M.D.

## 2021-10-01 NOTE — Assessment & Plan Note (Addendum)
>   Unclear etiology, possibly reactive in the setting of fall and femur fracture. > Chest x-ray clear.  Urinalysis pending but no symptoms. Low grade fever but on scheduled tylenol- ua not done, x ray w/o PNA, check blood cultures, doubt COVID as positive test was 2/1 - no fever- no cough, no dysuria -trending down

## 2021-10-01 NOTE — Assessment & Plan Note (Addendum)
Fall- mechanical in her yard on an uneven surface.   > X-ray in the ED showed comminuted intertrochanteric fracture of the left femur. -orthopedic consult -s/p surgery on 2/23 - careful with opioids considering her blood pressure drop after initial administration and apparent opioid sensitive patient -PT/OT- SNF

## 2021-10-01 NOTE — Progress Notes (Addendum)
°  Transition of Care [facility]) Screening Note   Patient Details  Name: Sandra Mclaughlin Date of Birth: 11/17/1930   Transition of [facility]) CM/SW Contact:    Sharin Mons, RN Phone Number: [phone] 10/01/2021, 4:08 PM   - S/p INTRAMEDULLARY NAILING OF THE LEFT HIP, 2/23  PT/OT evals pending ...  From home with husband. Supportive family. PTA independent with ADL's, no DME usage.  Transition of Care [facility]) has reviewed patient and will continue to monitor for [facility] needs through interdisciplinary progression rounds.

## 2021-10-01 NOTE — Assessment & Plan Note (Signed)
Continue home eyedrops °

## 2021-10-01 NOTE — Transfer of Care (Signed)
Immediate Anesthesia Transfer of Care Note  Patient: Sandra Mclaughlin  Procedure(s) Performed: INTRAMEDULLARY (IM) NAIL INTERTROCHANTRIC (Left)  Patient Location: PACU  Anesthesia Type:Spinal  Level of Consciousness: awake, alert  and oriented  Airway & Oxygen Therapy: Patient Spontanous Breathing and Patient connected to nasal cannula oxygen  Post-op Assessment: Report given to RN and Post -op Vital signs reviewed and stable  Post vital signs: Reviewed and stable  Last Vitals:  Vitals Value Taken Time  BP 109/68 10/01/21 1429  Temp    Pulse 85 10/01/21 1433  Resp 14 10/01/21 1433  SpO2 95 % 10/01/21 1433  Vitals shown include unvalidated device data.  Last Pain:  Vitals:   10/01/21 1215  TempSrc: Oral  PainSc:          Complications: No notable events documented.

## 2021-10-01 NOTE — Consult Note (Signed)
Reason for Consult:Left hip fx Referring Physician: Marlin Canary Time called: 0730 Time at bedside: 0924   Sandra Mclaughlin is an 88 y.o. female.  HPI: Sandra Mclaughlin was in her driveway and went to look at something when she slipped on some uneven ground and fell. She had immediate left hip pain and could not get up. She was brought to the ED where x-rays showed a left hip fx and orthopedic surgery was consulted. She lives at home with her husband and does not use any assistive devices to ambulate.  Past Medical History:  Diagnosis Date   History of bone density study 2022   History of colonoscopy 2015   History of CT scan 2022   History of mammogram 2022    Past Surgical History:  Procedure Laterality Date   ABDOMINAL HYSTERECTOMY  1974   APPENDECTOMY     CHOLECYSTECTOMY  1995   HERNIA REPAIR     SPINAL CORD STIMULATOR IMPLANT  2017   VAGINAL PROLAPSE REPAIR  1992    Family History  Problem Relation Age of Onset   Heart disease Mother    Lung cancer Brother     Social History:  reports that she has never smoked. She has never used smokeless tobacco. She reports that she does not drink alcohol and does not use drugs.  Allergies:  Allergies  Allergen Reactions   Ciprofloxacin Diarrhea, Nausea And Vomiting and Other (See Comments)    "I had a terrible reaction, vomiting, nausea, diarrhea, muscle pain...."    Medications: I have reviewed the patient's current medications.  Results for orders placed or performed during the hospital encounter of 09/30/21 (from the past 48 hour(s))  CBC     Status: Abnormal   Collection Time: 09/30/21  4:56 PM  Result Value Ref Range   WBC 20.8 (H) 4.0 - 10.5 K/uL   RBC 3.33 (L) 3.87 - 5.11 MIL/uL   Hemoglobin 9.8 (L) 12.0 - 15.0 g/dL   HCT 30.0 (L) 76.2 - 26.3 %   MCV 91.9 80.0 - 100.0 fL   MCH 29.4 26.0 - 34.0 pg   MCHC 32.0 30.0 - 36.0 g/dL   RDW 33.5 45.6 - 25.6 %   Platelets 210 150 - 400 K/uL   nRBC 0.0 0.0 - 0.2 %    Comment: Performed  at [facility], [address], [place], [place] [address]  Comprehensive metabolic panel     Status: Abnormal   Collection Time: 09/30/21  4:56 PM  Result Value Ref Range   Sodium 133 (L) 135 - 145 mmol/L   Potassium 4.5 3.5 - 5.1 mmol/L   Chloride 99 98 - 111 mmol/L   CO2 27 22 - 32 mmol/L   Glucose, Bld 121 (H) 70 - 99 mg/dL    Comment: Glucose reference range applies only to samples taken after fasting for at least 8 hours.   BUN 18 8 - 23 mg/dL   Creatinine, Ser 3.42 0.44 - 1.00 mg/dL   Calcium 8.4 (L) 8.9 - 10.3 mg/dL   Total Protein 5.8 (L) 6.5 - 8.1 g/dL   Albumin 2.8 (L) 3.5 - 5.0 g/dL   AST 23 15 - 41 U/L   ALT 13 0 - 44 U/L   Alkaline Phosphatase 45 38 - 126 U/L   Total Bilirubin 1.2 0.3 - 1.2 mg/dL   GFR, Estimated >87 >68 mL/min    Comment: (NOTE) Calculated using the CKD-EPI Creatinine Equation (2021)    Anion gap 7  5 - 15    Comment: Performed at [facility], [place] [address], [place], [place] [address]  Resp Panel by RT-PCR (Flu A&B, Covid) Nasopharyngeal Swab     Status: Abnormal   Collection Time: 09/30/21  4:56 PM   Specimen: Nasopharyngeal Swab; Nasopharyngeal(NP) swabs in vial transport medium  Result Value Ref Range   SARS Coronavirus 2 by RT PCR POSITIVE (A) NEGATIVE    Comment: (NOTE) SARS-CoV-2 target nucleic acids are DETECTED.  The SARS-CoV-2 RNA is generally detectable in upper respiratory specimens during the acute phase of infection. Positive results are indicative of the presence of the identified virus, but do not rule out bacterial infection or co-infection with other pathogens not detected by the test. Clinical correlation with patient history and other diagnostic information is necessary to determine patient infection status. The expected result is Negative.  Fact Sheet for Patients: EntrepreneurPulse.com.au  Fact Sheet for Healthcare Providers: IncredibleEmployment.be  This test is not  yet approved or cleared by the [place] FDA and  has been authorized for detection and/or diagnosis of SARS-CoV-2 by FDA under an Emergency Use Authorization (EUA).  This EUA will remain in effect (meaning this test can be used) for the duration of  the COVID-19 declaration under Section 564(b)(1) of the A ct, 21 U.S.C. section 360bbb-3(b)(1), unless the authorization is terminated or revoked sooner.     Influenza A by PCR NEGATIVE NEGATIVE   Influenza B by PCR NEGATIVE NEGATIVE    Comment: (NOTE) The Xpert Xpress SARS-CoV-2/FLU/RSV plus assay is intended as an aid in the diagnosis of influenza from Nasopharyngeal swab specimens and should not be used as a sole basis for treatment. Nasal washings and aspirates are unacceptable for Xpert Xpress SARS-CoV-2/FLU/RSV testing.  Fact Sheet for Patients: EntrepreneurPulse.com.au  Fact Sheet for Healthcare Providers: IncredibleEmployment.be  This test is not yet approved or cleared by the [place] FDA and has been authorized for detection and/or diagnosis of SARS-CoV-2 by FDA under an Emergency Use Authorization (EUA). This EUA will remain in effect (meaning this test can be used) for the duration of the COVID-19 declaration under Section 564(b)(1) of the Act, 21 U.S.C. section 360bbb-3(b)(1), unless the authorization is terminated or revoked.  Performed at [facility], [place] [address] [place], [place] [address]   Type and screen [place]     Status: None   Collection Time: 09/30/21  5:15 PM  Result Value Ref Range   ABO/RH(D) O POS    Antibody Screen NEG    Sample Expiration      10/03/2021,2359 Performed at [facility], [place] [address] [place], [place] [address]   POC occult blood, ED Provider will collect     Status: None   Collection Time: 09/30/21  6:50 PM  Result Value Ref Range   Fecal Occult Bld NEGATIVE NEGATIVE  ABO/Rh     Status: None    Collection Time: 09/30/21  8:45 PM  Result Value Ref Range   ABO/RH(D)      O POS Performed at [facility] [address] [place] [address]   CBC     Status: Abnormal   Collection Time: 09/30/21  8:45 PM  Result Value Ref Range   WBC 16.4 (H) 4.0 - 10.5 K/uL   RBC 2.64 (L) 3.87 - 5.11 MIL/uL   Hemoglobin 7.9 (L) 12.0 - 15.0 g/dL   HCT 24.6 (L) 36.0 - 46.0 %   MCV 93.2  80.0 - 100.0 fL   MCH 29.9 26.0 - 34.0 pg   MCHC 32.1 30.0 - 36.0 g/dL   RDW 13.4 11.5 - 15.5 %   Platelets 147 (L) 150 - 400 K/uL   nRBC 0.0 0.0 - 0.2 %    Comment: Performed at [facility] [address], [place], [place] [address]  MRSA Next Gen by PCR, Nasal     Status: None   Collection Time: 10/01/21  1:24 AM   Specimen: Nasal Mucosa; Nasal Swab  Result Value Ref Range   MRSA by PCR Next Gen NOT DETECTED NOT DETECTED    Comment: (NOTE) The GeneXpert MRSA Assay (FDA approved for NASAL specimens only), is one component of a comprehensive MRSA colonization surveillance program. It is not intended to diagnose MRSA infection nor to guide or monitor treatment for MRSA infections. Test performance is not FDA approved in patients less than 59 years old. Performed at [facility], [place] [address], [place], [place] [address]   CBC     Status: Abnormal   Collection Time: 10/01/21  2:17 AM  Result Value Ref Range   WBC 15.5 (H) 4.0 - 10.5 K/uL   RBC 2.71 (L) 3.87 - 5.11 MIL/uL   Hemoglobin 8.0 (L) 12.0 - 15.0 g/dL   HCT 25.2 (L) 36.0 - 46.0 %   MCV 93.0 80.0 - 100.0 fL   MCH 29.5 26.0 - 34.0 pg   MCHC 31.7 30.0 - 36.0 g/dL   RDW 13.6 11.5 - 15.5 %   Platelets 149 (L) 150 - 400 K/uL   nRBC 0.0 0.0 - 0.2 %    Comment: Performed at [facility], [place] [address] [place], [place] [id number]  Basic metabolic panel     Status: Abnormal   Collection Time: 10/01/21  2:17 AM  Result Value Ref Range   Sodium 134 (L) 135 - 145 mmol/L   Potassium 4.1 3.5 - 5.1 mmol/L   Chloride 104  98 - 111 mmol/L   CO2 26 22 - 32 mmol/L   Glucose, Bld 91 70 - 99 mg/dL    Comment: Glucose reference range applies only to samples taken after fasting for at least 8 hours.   BUN 16 8 - 23 mg/dL   Creatinine, Ser 0.80 0.44 - 1.00 mg/dL   Calcium 7.7 (L) 8.9 - 10.3 mg/dL   GFR, Estimated >60 >60 mL/min    Comment: (NOTE) Calculated using the CKD-EPI Creatinine Equation (2021)    Anion gap 4 (L) 5 - 15    Comment: Performed at [facility] [address], [place], [place] [address]  Folate, serum, performed at [facility] lab     Status: None   Collection Time: 10/01/21  2:17 AM  Result Value Ref Range   Folate 30.3 >5.9 ng/mL    Comment: Performed at [facility], [place] [address] [place],  [address]  Vitamin B12     Status: None   Collection Time: 10/01/21  2:17 AM  Result Value Ref Range   Vitamin B-12 512 180 - 914 pg/mL    Comment: (NOTE) This assay is not validated for testing neonatal or myeloproliferative syndrome specimens for Vitamin B12 levels. Performed at [facility], [place] [address] [place], [place] [address]   Iron and TIBC     Status: Abnormal   Collection Time: 10/01/21  2:17 AM  Result Value Ref Range   Iron 18 (L) 28 -  170 ug/dL   TIBC 161 (L) 250 - 450 ug/dL   Saturation Ratios 11 10.4 - 31.8 %   UIBC 143 ug/dL    Comment: Performed at [facility], [place] [address] [place], [place] [address]  Ferritin     Status: Abnormal   Collection Time: 10/01/21  2:17 AM  Result Value Ref Range   Ferritin 353 (H) 11 - 307 ng/mL    Comment: Performed at [facility], [place] [address] [place], [place] [address]    DG Chest 1 View  Result Date: 09/30/2021 CLINICAL DATA:  fall, pain EXAM: CHEST  1 VIEW.  Patient is rotated. COMPARISON:  None. FINDINGS: Prominent ascending aorta silhouette due to patient rotation. The heart and mediastinal contours are within normal limits. Aortic calcification. No focal consolidation. No pulmonary  edema. No pleural effusion. No pneumothorax. No acute osseous abnormality. Couple neural stimulator leads noted overlying the thoracic vertebral bodies. IMPRESSION: 1. No active disease. 2.  Aortic Atherosclerosis (ICD10-I70.0). Electronically Signed   By: Iven Finn M.D.   On: 09/30/2021 16:49   CT PELVIS W CONTRAST  Result Date: 09/30/2021 CLINICAL DATA:  Closed comminuted intertrochanteric fracture of left femur. Left hip fracture, declining hemoglobin, assess for hematoma/bleed. EXAM: CT PELVIS WITH CONTRAST TECHNIQUE: Multidetector CT imaging of the pelvis was performed using the standard protocol following the bolus administration of intravenous contrast. RADIATION DOSE REDUCTION: This exam was performed according to the departmental dose-optimization program which includes automated exposure control, adjustment of the mA and/or kV according to patient size and/or use of iterative reconstruction technique. CONTRAST:  21mL OMNIPAQUE IOHEXOL 350 MG/ML SOLN COMPARISON:  Hip radiograph earlier today. FINDINGS: Urinary Tract: Distal ureters are decompressed. Mild bladder distension without wall thickening. Bowel: Prominent colonic diverticulosis. No diverticulitis or inflammation of pelvic bowel loops. Vascular/Lymphatic: Aorto bi-iliac atherosclerosis. There is intramuscular hemorrhage related to left intertrochanteric femur fracture, but no evidence of active bleed or focal hematoma. Reproductive:  Hysterectomy.  No adnexal mass. Other: Battery pack in the right gluteal soft tissues. There is no pelvic free fluid. Musculoskeletal: Comminuted and displaced intertrochanteric femur fracture. There is apex anterior angulation with displacement of fracture fragments. Mild proximal migration of the femoral shaft. There is hemorrhage in the adjacent musculature with edematous enlarged musculature, but no evidence of active bleeding or confluent hematoma. There is mild soft tissue edema laterally. Chronic soft  tissue calcifications are likely gluteal granulomas. IMPRESSION: 1. Comminuted and displaced intertrochanteric femur fracture with apex anterior angulation and displacement of fracture fragments. There is hemorrhage in the adjacent musculature with edematous enlarged musculature, but no evidence of active bleeding or confluent hematoma. 2. Colonic diverticulosis without diverticulitis. Aortic Atherosclerosis (ICD10-I70.0). Electronically Signed   By: Keith Rake M.D.   On: 09/30/2021 22:25   DG HIP UNILAT W OR W/O PELVIS 2-3 VIEWS LEFT  Result Date: 09/30/2021 CLINICAL DATA:  Fall, left hip pain EXAM: DG HIP (WITH OR WITHOUT PELVIS) 2-3V LEFT COMPARISON:  03/05/2021 FINDINGS: Acute comminuted intertrochanteric fracture of the proximal left femur with varus angulation. Mildly displaced lesser trochanteric fragment. No dislocation. Pelvic bony ring appears intact. IMPRESSION: Acute comminuted intertrochanteric fracture of the proximal left femur. Electronically Signed   By: Davina Poke D.O.   On: 09/30/2021 16:51    Review of Systems  HENT:  Negative for ear discharge, ear pain, hearing loss and tinnitus.   Eyes:  Negative for photophobia and pain.  Respiratory:  Negative for cough and shortness of breath.  Cardiovascular:  Negative for chest pain.  Gastrointestinal:  Negative for abdominal pain, nausea and vomiting.  Genitourinary:  Negative for dysuria, flank pain, frequency and urgency.  Musculoskeletal:  Positive for arthralgias (Left hip). Negative for back pain, myalgias and neck pain.  Neurological:  Negative for dizziness and headaches.  Hematological:  Does not bruise/bleed easily.  Psychiatric/Behavioral:  The patient is not nervous/anxious.   Blood pressure (!) 114/52, pulse 85, temperature 98.2 F (36.8 C), temperature source Oral, resp. rate 18, height 5' 1.5" (1.562 m), weight 49.9 kg, SpO2 99 %. Physical Exam Constitutional:      General: She is not in acute distress.     Appearance: She is well-developed. She is not diaphoretic.  HENT:     Head: Normocephalic and atraumatic.  Eyes:     General: No scleral icterus.       Right eye: No discharge.        Left eye: No discharge.     Conjunctiva/sclera: Conjunctivae normal.  Cardiovascular:     Rate and Rhythm: Normal rate and regular rhythm.  Pulmonary:     Effort: Pulmonary effort is normal. No respiratory distress.  Musculoskeletal:     Cervical back: Normal range of motion.     Comments: LLE No traumatic wounds, ecchymosis, or rash  Mild TTP hip  No knee or ankle effusion  Knee stable to varus/ valgus and anterior/posterior stress  Sens DPN, SPN, TN intact  Motor EHL, ext, flex, evers 5/5  DP 2+, PT 1+, No significant edema  Skin:    General: Skin is warm and dry.  Neurological:     Mental Status: She is alert.  Psychiatric:        Mood and Affect: Mood normal.        Behavior: Behavior normal.    Assessment/Plan: Left hip fx -- Plan IMN today with Dr. Marcelino Scot. Please keep NPO.     Lisette Abu, PA-C Orthopedic Surgery [phone] 10/01/2021, 9:35 AM

## 2021-10-01 NOTE — Plan of Care (Signed)

## 2021-10-01 NOTE — Anesthesia Preprocedure Evaluation (Addendum)
Anesthesia Evaluation  Patient identified by MRN, date of birth, ID band Patient awake    Reviewed: Allergy & Precautions, NPO status , Patient's Chart, lab work & pertinent test results, reviewed documented beta blocker date and time   Airway Mallampati: II  TM Distance: >3 FB Neck ROM: Full    Dental no notable dental hx. (+) Teeth Intact, Dental Advisory Given   Pulmonary neg pulmonary ROS,    Pulmonary exam normal breath sounds clear to auscultation       Cardiovascular hypertension, Pt. on medications Normal cardiovascular exam Rhythm:Regular Rate:Normal     Neuro/Psych Peripheral neuropathy Glaucoma  Neuromuscular disease negative psych ROS   GI/Hepatic negative GI ROS, Neg liver ROS,   Endo/Other  negative endocrine ROS  Renal/GU negative Renal ROS  negative genitourinary   Musculoskeletal  (+) Arthritis , Osteoarthritis,  Intertrochanteric left hip Fx   Abdominal   Peds  Hematology  (+) Blood dyscrasia, anemia ,   Anesthesia Other Findings   Reproductive/Obstetrics                            Anesthesia Physical Anesthesia Plan  ASA: 3  Anesthesia Plan: Spinal   Post-op Pain Management: Minimal or no pain anticipated   Induction:   PONV Risk Score and Plan: 3 and Treatment may vary due to age or medical condition and Ondansetron  Airway Management Planned: Natural Airway and Simple Face Mask  Additional Equipment:   Intra-op Plan:   Post-operative Plan:   Informed Consent: I have reviewed the patients History and Physical, chart, labs and discussed the procedure including the risks, benefits and alternatives for the proposed anesthesia with the patient or authorized representative who has indicated his/her understanding and acceptance.   Patient has DNR.  Discussed DNR with patient and Suspend DNR.   Dental advisory given  Plan Discussed with: CRNA and  Anesthesiologist  Anesthesia Plan Comments:        Anesthesia Quick Evaluation

## 2021-10-01 NOTE — Assessment & Plan Note (Signed)
Continue home gabapentin ?

## 2021-10-01 NOTE — TOC CAGE-AID Note (Signed)
Transition of Care [facility]) - CAGE-AID Screening   Patient Details  Name: Sandra Mclaughlin MRN: [id number] Date of Birth: [birth year]  Transition of Care [facility]) CM/SW Contact:    Joshua Soulier C Tarpley-Carter, LCSWA Phone Number: 10/01/2021, 1:57 PM   Clinical Narrative: Pt participated in Cage-Aid.  Pt stated she does not use substance or ETOH.  Pt was not offered resources, due to no usage of substance or ETOH.     Lindie Roberson Tarpley-Carter, MSW, LCSW-A Pronouns:  She/Her/Hers [place] Transitions of Care Clinical Social Worker Direct Number:  [phone] [email or web address]   CAGE-AID Screening:    Have You Ever Felt You Ought to Cut Down on Your Drinking or Drug Use?: No Have People Annoyed You By [employer] Your Drinking Or Drug Use?: No Have You Felt Bad Or Guilty About Your Drinking Or Drug Use?: No Have You Ever Had a Drink or Used Drugs First Thing In The Morning to Steady Your Nerves or to Get Rid of a Hangover?: No CAGE-AID Score: 0  Substance Abuse Education Offered: No

## 2021-10-01 NOTE — Hospital Course (Addendum)
Sandra Mclaughlin is a 88 y.o. female with medical history significant of hypertension, glaucoma, degenerative disc disease, chronic pain, neuropathy, diverticulosis, colitis presenting after a fall with hip pain.   Found to have a left femur fracture.  S/p OR on 2/23.  Initially progressive well with PT but stay have been complicated by hypotension (resolved), anemia (resolved) and "low grade" fever on scheduled tylenol (resolved).

## 2021-10-02 ENCOUNTER — Encounter (HOSPITAL_COMMUNITY): Payer: Self-pay | Admitting: Orthopedic Surgery

## 2021-10-02 DIAGNOSIS — S72142A Displaced intertrochanteric fracture of left femur, initial encounter for closed fracture: Secondary | ICD-10-CM | POA: Diagnosis not present

## 2021-10-02 DIAGNOSIS — I959 Hypotension, unspecified: Secondary | ICD-10-CM

## 2021-10-02 DIAGNOSIS — W010XXA Fall on same level from slipping, tripping and stumbling without subsequent striking against object, initial encounter: Secondary | ICD-10-CM

## 2021-10-02 DIAGNOSIS — D649 Anemia, unspecified: Secondary | ICD-10-CM | POA: Diagnosis not present

## 2021-10-02 LAB — BASIC METABOLIC PANEL
Anion gap: 5 (ref 5–15)
BUN: 12 mg/dL (ref 8–23)
CO2: 25 mmol/L (ref 22–32)
Calcium: 7.9 mg/dL — ABNORMAL LOW (ref 8.9–10.3)
Chloride: 104 mmol/L (ref 98–111)
Creatinine, Ser: 0.77 mg/dL (ref 0.44–1.00)
GFR, Estimated: >60 mL/min (ref >60)
Glucose, Bld: 88 mg/dL (ref 70–99)
Potassium: 4 mmol/L (ref 3.5–5.1)
Sodium: 134 mmol/L — ABNORMAL LOW (ref 135–145)

## 2021-10-02 LAB — CBC
HCT: 23.5 % — ABNORMAL LOW (ref 36.0–46.0)
Hemoglobin: 7.6 g/dL — ABNORMAL LOW (ref 12.0–15.0)
MCH: 29.5 pg (ref 26.0–34.0)
MCHC: 32.3 g/dL (ref 30.0–36.0)
MCV: 91.1 fL (ref 80.0–100.0)
Platelets: 122 10*3/uL — ABNORMAL LOW (ref 150–400)
RBC: 2.58 MIL/uL — ABNORMAL LOW (ref 3.87–5.11)
RDW: 15.1 % (ref 11.5–15.5)
WBC: 13.7 10*3/uL — ABNORMAL HIGH (ref 4.0–10.5)
nRBC: 0 % (ref 0.0–0.2)

## 2021-10-02 LAB — PREPARE RBC (CROSSMATCH)

## 2021-10-02 LAB — HEMOGLOBIN AND HEMATOCRIT, BLOOD
HCT: 29.5 % — ABNORMAL LOW (ref 36.0–46.0)
Hemoglobin: 9.8 g/dL — ABNORMAL LOW (ref 12.0–15.0)

## 2021-10-02 LAB — VITAMIN D 25 HYDROXY (VIT D DEFICIENCY, FRACTURES): Vit D, 25-Hydroxy: 34.03 ng/mL (ref 30–100)

## 2021-10-02 MED ORDER — SODIUM CHLORIDE 0.9% IV SOLUTION: Freq: Once | INTRAVENOUS | Status: AC

## 2021-10-02 NOTE — Progress Notes (Signed)
°  Progress Note   Patient: Sandra Mclaughlin [id number] DOB: 08/07/1931 DOA: 09/30/2021     2 DOS: the patient was seen and examined on 10/02/2021   Brief hospital course: Sandra Mclaughlin is a 88 y.o. female with medical history significant of hypertension, glaucoma, degenerative disc disease, chronic pain, neuropathy, diverticulosis, colitis presenting after a fall with hip pain.   Found to have a left femur fracture.  S/p OR on 2/23. Progressive well with PT -- may be able to go home - PT will see over the weekend.  Assessment and Plan: * Closed comminuted intertrochanteric fracture of left femur ([place])- (present on admission) Fall- mechanical in her yard on an uneven surface.   > X-ray in the ED showed comminuted intertrochanteric fracture of the left femur. -orthopedic consult -s/p surgery on 2/23 - careful with opioids considering her blood pressure drop after initial administration and apparent opioid sensitive patient    Leukocytosis > Unclear etiology, possibly reactive in the setting of fall and femur fracture. > Chest x-ray clear.  Urinalysis pending but no symptoms. > Patient denies fevers or chills or other infectious symptoms - Trending down  Anemia > Hemoglobin incidentally noted to be 9.8 in the ED down from previous baseline of 13 several months ago. > No clear evidence of bleeding, no hematoma from hip fracture - Type and screen already ordered in the ED- plan to give 1 unit PRBC prior to surgery 2/23 -another unit 2/24  Peripheral neuropathy - Continue home gabapentin  Osteoporosis with fracture- (present on admission) > Now with fracture as above - Continue calcium and vitamin D  Glaucoma of both eyes- (present on admission) - Continue home eyedrops  Primary hypertension- (present on admission) - Holding home amlodipine and lisinopril in the setting of transient hypotension        Subjective: up in chair, NAD  Physical Exam: Vitals:   10/02/21 1052  10/02/21 1057 10/02/21 1100 10/02/21 1315  BP: (!) 98/50 (!) 95/47 (!) 95/47 98/76  Pulse: 71 70 69 67  Resp: 20 13 19    Temp: 98.4 F (36.9 C) 98.4 F (36.9 C)  98.4 F (36.9 C)  TempSrc:  Oral  Oral  SpO2: 100% 97% 99%   Weight:      Height:        General: Appearance:    Well developed, well nourished female in no acute distress     Lungs:      respirations unlabored  Heart:    Normal heart rate.    MS:   All extremities are intact.    Neurologic:   Awake, alert      Data Reviewed: Na 134- stable, Hgb down to 7.6  Family Communication: daughter/son at bedside  Disposition: Status is: Inpatient Remains inpatient appropriate because: needs further PT, stable Hgb and improved BP          Planned Discharge Destination: Home with Home Health   Time spent: 75 minutes  Author: Geradine Girt, DO 10/02/2021 1:39 PM  For on call review [email or web address].

## 2021-10-02 NOTE — TOC Initial Note (Signed)
Transition of Care [facility]) - Initial/Assessment Note    Patient Details  Name: Sandra Mclaughlin MRN: [id number] Date of Birth: 1930/09/25  Transition of Care [facility]) CM/SW Contact:    Ninfa Meeker, RN Phone Number: 10/02/2021, 2:57 PM  Clinical Narrative:   Pt is an 88 y.o. female admitted 09/30/21 after a fall in her yard sustaining closed comminuted intertrochanteric L femur fx. S/p L hip IMN 2/23.    Case manager spoke with patient concerning need for HHPT/OT at discharge. Discussed Home Health agency options. Patient states no preference and is fine with referral being called to [facility]. , [facility] does not have a contract with [employer]. DME will be delivered to room by [employer]. Patient states she will have support of her husband at discharge. TOC team will continue to monitor for needs.                 Patient Goals and CMS Choice        Expected Discharge Plan and Services Expected Discharge Plan: [place]   Discharge Planning Services: CM Consult Post Acute Care Choice: Durable Medical Equipment, Home Health Living arrangements for the past 2 months: Single Family Home                 DME Arranged: 3-N-1 DME Agency: [employer] Date DME Agency Contacted: 10/02/21 Time DME Agency Contacted: [phone] Representative spoke with at DME Agency: [place]: PT, OT [place] Agency: [place] Date [place]: 10/02/21 Time [place]: 1453 Representative spoke with at [place] Arrangements/Services Living arrangements for the past 2 months: [place] Lives with:: Spouse Patient language and need for interpreter reviewed:: Yes Do you feel safe going back to the place where you live?: Yes      Need for Family Participation in Patient Care: Yes (Comment) Care giver support system in place?: Yes (comment)   Criminal Activity/Legal Involvement Pertinent to Current Situation/Hospitalization: No -  Comment as needed  Activities of Daily Living      Permission Sought/Granted Permission sought to share information with : Case Manager       Permission granted to share info w AGENCY: Alvis Lemmings        Emotional Assessment   Attitude/Demeanor/Rapport: Gracious   Orientation: : Oriented to Self, Oriented to Place, Oriented to  Time, Oriented to Situation Alcohol / Substance Use: Not Applicable Psych Involvement: No (comment)  Admission diagnosis:  Other specified hypotension [I95.89] Fall from slip, trip, or stumble, initial encounter [W01.0XXA] Closed intertrochanteric fracture of left hip, initial encounter ([place]) [S72.142A] Closed comminuted intertrochanteric fracture of left femur ([place]) [S72.142A] Patient Active Problem List   Diagnosis Date Noted   Osteoporosis with fracture 09/30/2021   Diverticulitis 09/30/2021   Infectious colitis, enteritis and gastroenteritis 09/30/2021   Peripheral neuropathy 09/30/2021   Closed comminuted intertrochanteric fracture of left femur ([employer]) 09/30/2021   Fall at home, initial encounter 09/30/2021   Anemia 09/30/2021   Leukocytosis 09/30/2021   Epigastric pain 04/16/2021   Nausea 04/16/2021   Primary hypertension 04/16/2021   Other constipation 04/16/2021   Chronic pain of left knee 04/16/2021   Chronic right shoulder pain 04/16/2021   Degenerative disc disease, thoracic 04/16/2021   Glaucoma of both eyes 04/16/2021   Aortic atherosclerosis ([place]) 04/16/2021   Neuropathic pain 05/11/2016   PCP:  Alroy Dust, L.Marlou Sa, MD Pharmacy:   [place], [place] - [address] [place] [address] Phone: [phone] Fax: [phone]     Social Determinants of Health (SDOH) Interventions    Readmission Risk Interventions No flowsheet data found.

## 2021-10-02 NOTE — Care Management Important Message (Signed)
Important Message  Patient Details  Name: Sandra Mclaughlin MRN: [id number] Date of Birth: 28-Nov-1930   Medicare Important Message Given:  Yes     Sherilyn Banker 10/02/2021, 1:57 PM

## 2021-10-02 NOTE — Anesthesia Postprocedure Evaluation (Signed)
Anesthesia Post Note  Patient: Macky Lower Jackowski  Procedure(s) Performed: INTRAMEDULLARY (IM) NAIL INTERTROCHANTRIC (Left)     Patient location during evaluation: PACU Anesthesia Type: Spinal Level of consciousness: awake and alert Pain management: pain level controlled Vital Signs Assessment: post-procedure vital signs reviewed and stable Respiratory status: spontaneous breathing, nonlabored ventilation and respiratory function stable Cardiovascular status: blood pressure returned to baseline and stable Postop Assessment: no apparent nausea or vomiting Anesthetic complications: no   No notable events documented.  Last Vitals:  Vitals:   10/01/21 2357 10/02/21 0510  BP: (!) 114/57 (!) 99/51  Pulse: 84 71  Resp: 17 17  Temp: 36.8 C 37.1 C  SpO2: 94% 91%    Last Pain:  Vitals:   10/02/21 0510  TempSrc: Oral  PainSc:                  Lynda Rainwater

## 2021-10-02 NOTE — Progress Notes (Signed)
Mobility Specialist: Progress Note   10/02/21 1753  Mobility  Activity Ambulated with assistance in room  Level of Assistance Moderate assist, patient does 50-74%  Assistive Device Front wheel walker  LLE Weight Bearing WBAT  Distance Ambulated (ft) 40 ft  Activity Response Tolerated well  $Mobility charge 1 Mobility   Pt received in bed and agreeable to ambulation. ModA to sit EOB and minA to stand. Pt c/o 8-9/10 pain in her LLE during ambulation, otherwise asymptomatic. Ambulated on RA with sats maintaining >90% SpO2. Pt back to bed after walk with call bell and phone at her side. Bed alarm is on and family present in the room.   [facility] Torrin Frein Mobility Specialist Mobility Specialist 5 [place]: [phone] Mobility Specialist 6 [place]: [phone]

## 2021-10-02 NOTE — Evaluation (Signed)
Physical Therapy Evaluation Patient Details Name: Sandra Mclaughlin MRN: [id number] DOB: 1930-10-08 Today's Date: 10/02/2021  History of Present Illness  Pt is an 88 y.o. female admitted 09/30/21 after a fall in her yard sustaining closed comminuted intertrochanteric L femur fx. S/p L hip IMN 2/23. PMH includes hernia repair, spinal cord stimulator.   Clinical Impression  Pt presents with an overall decrease in functional mobility secondary to above. PTA, pt independent and lives with supportive husband. Today, pt able to initiate transfer and gait training with RW, requiring min guard to modA for mobility secondary to post-op pain and weakness. Increased time discussing safe d/c planning, including assist and DME needs. Pt moving remarkably well for first time up post-op; appropriate for return home with HHPT services if family able to provide necessary assist. Will follow acutely to address established goals.  Supine BP 93/48 Post-ambulation BP 109/64 SpO2 92% on RA HR 74   Recommendations for follow up therapy are one component of a multi-disciplinary discharge planning process, led by the attending physician.  Recommendations may be updated based on patient status, additional functional criteria and insurance authorization.  Follow Up Recommendations Home health PT    Assistance Recommended at Discharge Frequent or constant Supervision/Assistance  Patient can return home with the following  A little help with walking and/or transfers;A little help with bathing/dressing/bathroom;Assistance with cooking/housework;Assist for transportation;Help with stairs or ramp for entrance    Equipment Recommendations Rolling walker (2 wheels; youth-sized?);BSC/3in1;Wheelchair (measurements PT);Wheelchair cushion (measurements PT)   Recommendations for Other Services   Occupational Therapy   Functional Status Assessment Patient has had a recent decline in their functional status and demonstrates the  ability to make significant improvements in function in a reasonable and predictable amount of time.     Precautions / Restrictions Precautions Precautions: Fall Restrictions Weight Bearing Restrictions: Yes LLE Weight Bearing: Weight bearing as tolerated      Mobility  Bed Mobility Overal bed mobility: Needs Assistance Bed Mobility: Supine to Sit     Supine to sit: Mod assist, HOB elevated, Max assist     General bed mobility comments: MaxA for LLE management and modA for trunk elevation    Transfers Overall transfer level: Needs assistance Equipment used: Rolling walker (2 wheels) Transfers: Sit to/from Stand Sit to Stand: Min assist, +2 safety/equipment           General transfer comment: cues for hand placement and sequencing, minA for trunk elevation and stability    Ambulation/Gait Ambulation/Gait assistance: Min assist, +2 safety/equipment (chair follow) Gait Distance (Feet): 10 Feet Assistive device: Rolling walker (2 wheels) Gait Pattern/deviations: Step-to pattern, Decreased weight shift to left, Antalgic, Trunk flexed Gait velocity: Decrease     General Gait Details: Slow, antalgic gait with RW and intermittent minA for stability; able to take complete steps, although difficulty taking complete step with LLE with increasing fatigue; +2 chair follow to allow seated rest with pain and fatigue  Stairs            Wheelchair Mobility    Modified Rankin (Stroke Patients Only)       Balance Overall balance assessment: Needs assistance Sitting-balance support: No upper extremity supported, Feet supported Sitting balance-Leahy Scale: Fair     Standing balance support: Reliant on assistive device for balance   Standing balance comment: B UE and external support, unable to release walker in static standing  Pertinent Vitals/Pain Pain Assessment Pain Assessment: Faces Faces Pain Scale: Hurts little  more Pain Location: L hip Pain Descriptors / Indicators: Sore, Discomfort, Grimacing, Guarding Pain Intervention(s): Monitored during session, Limited activity within patient's tolerance, Ice applied    Home Living Family/patient expects to be discharged to:: Private residence Living Arrangements: Spouse/significant other Available Help at Discharge: Family;Available 24 hours/day Type of Home: House Home Access: Stairs to enter Entrance Stairs-Rails: None Entrance Stairs-Number of Steps: 1   Home Layout: One level Home Equipment: None Additional Comments: daughter is in town to help and son lives nearby    Prior Function Prior Level of Function : Independent/Modified Independent             Mobility Comments: Independent without DME ADLs Comments: Takes tub baths without assist; works together with husband on housekeeping and meal prep     Hand Dominance   Dominant Hand: Right    Extremity/Trunk Assessment   Upper Extremity Assessment Upper Extremity Assessment: Overall WFL for tasks assessed    Lower Extremity Assessment Lower Extremity Assessment: LLE deficits/detail LLE Deficits / Details: s/p L hip IMN with expected post-op pain and weakness LLE: Unable to fully assess due to pain LLE Coordination: decreased gross motor    Cervical / Trunk Assessment Cervical / Trunk Assessment: Kyphotic  Communication   Communication: No difficulties  Cognition Arousal/Alertness: Awake/alert Behavior During Therapy: WFL for tasks assessed/performed Overall Cognitive Status: Within Functional Limits for tasks assessed                                          General Comments General comments (skin integrity, edema, etc.): increased time discussing d/c plan, assist needs and DME needs; pt hopeful for return home with [facility] services    Exercises     Assessment/Plan    PT Assessment Patient needs continued PT services  PT Problem List Decreased  strength;Decreased range of motion;Decreased activity tolerance;Decreased balance;Decreased mobility;Decreased knowledge of use of DME;Pain       PT Treatment Interventions DME instruction;Gait training;Stair training;Functional mobility training;Therapeutic activities;Therapeutic exercise;Balance training;Patient/family education    PT Goals (Current goals can be found in the Care Plan section)  Acute Rehab PT Goals Patient Stated Goal: return home PT Goal Formulation: With patient Time For Goal Achievement: 10/16/21 Potential to Achieve Goals: Good    Frequency Min 5X/week     Co-evaluation   Reason for Co-Treatment: For patient/therapist safety           AM-PAC PT "6 Clicks" Mobility  Outcome Measure Help needed turning from your back to your side while in a flat bed without using bedrails?: A Lot Help needed moving from lying on your back to sitting on the side of a flat bed without using bedrails?: A Lot Help needed moving to and from a bed to a chair (including a wheelchair)?: A Little Help needed standing up from a chair using your arms (e.g., wheelchair or bedside chair)?: A Little Help needed to walk in hospital room?: Total Help needed climbing 3-5 steps with a railing? : A Lot 6 Click Score: 13    End of Session Equipment Utilized During Treatment: Gait belt Activity Tolerance: Patient tolerated treatment well Patient left: with call bell/phone within reach;in chair;with chair alarm set Nurse Communication: Mobility status PT Visit Diagnosis: Other abnormalities of gait and mobility (R26.89);Muscle weakness (generalized) (M62.81);Pain Pain - Right/Left: Left Pain -  part of body: Hip    Time: [id number] PT Time Calculation (min) (ACUTE ONLY): 30 min   Charges:   PT Evaluation $PT Eval Moderate Complexity: [place], PT, DPT Acute Rehabilitation Services  Pager [phone] Office [place] 10/02/2021, 10:47  AM

## 2021-10-02 NOTE — Evaluation (Signed)
Occupational Therapy Evaluation Patient Details Name: Sandra Mclaughlin MRN: [id number] DOB: [birth year] Today's Date: 10/02/2021   History of Present Illness Pt is an 88 y.o. female admitted 09/30/21 after a fall in her yard sustaining closed comminuted intertrochanteric L femur fx. S/p L hip IMN 2/23. PMH includes hernia repair, spinal cord stimulator.   Clinical Impression   Pt walked and completed ADLs independently, including getting down in the tub to bathe, prior to admission. She and her husband worked together on IADLs. Pt presents with L hip soreness, generalized weakness and impaired standing balance. Supine to sit requires max assist. She stands from elevated bed and ambulates with +2 min assist and close chair follow. She requires set up to max assist for ADLs. Pt moves remarkably well for her first time OOB. Recommending HHOT if family can provide necessary physical assistance at home. Will follow acutely.      Recommendations for follow up therapy are one component of a multi-disciplinary discharge planning process, led by the attending physician.  Recommendations may be updated based on patient status, additional functional criteria and insurance authorization.   Follow Up Recommendations  Home health OT    Assistance Recommended at Discharge Frequent or constant Supervision/Assistance  Patient can return home with the following A little help with walking and/or transfers;A little help with bathing/dressing/bathroom;Assistance with cooking/housework;Direct supervision/assist for medications management;Assist for transportation;Help with stairs or ramp for entrance    Functional Status Assessment  Patient has had a recent decline in their functional status and demonstrates the ability to make significant improvements in function in a reasonable and predictable amount of time.  Equipment Recommendations  BSC/3in1    Recommendations for Other Services       Precautions /  Restrictions Precautions Precautions: Fall Restrictions Weight Bearing Restrictions: Yes LLE Weight Bearing: Weight bearing as tolerated      Mobility Bed Mobility Overal bed mobility: Needs Assistance Bed Mobility: Supine to Sit     Supine to sit: Max assist, HOB elevated     General bed mobility comments: cues for technique, use of rail assist for L LE over EOB, to raise trunk and for hips to EOB    Transfers Overall transfer level: Needs assistance Equipment used: Rolling walker (2 wheels) Transfers: Sit to/from Stand Sit to Stand: +2 physical assistance, Min assist           General transfer comment: cues for hand placement, assist to rise and steady, from elevated bed simulating home      Balance Overall balance assessment: Needs assistance   Sitting balance-Leahy Scale: Fair     Standing balance support: Bilateral upper extremity supported Standing balance-Leahy Scale: Poor Standing balance comment: B UE and external support, unable to release walker in static standing                           ADL either performed or assessed with clinical judgement   ADL Overall ADL's : Needs assistance/impaired Eating/Feeding: Independent;Sitting   Grooming: Set up;Sitting   Upper Body Bathing: Minimal assistance;Sitting   Lower Body Bathing: Maximal assistance;Sit to/from stand   Upper Body Dressing : Minimal assistance;Sitting   Lower Body Dressing: Maximal assistance;Sit to/from stand   Toilet Transfer: Minimal assistance;Ambulation;BSC/3in1;Rolling walker (2 wheels)   Toileting- Clothing Manipulation and Hygiene: Minimal assistance;Sit to/from stand       Functional mobility during ADLs: Minimal assistance;+2 for safety/equipment;Rolling walker (2 wheels)       Vision Ability  to See in Adequate Light: 0 Adequate Patient Visual Report: No change from baseline       Perception     Praxis      Pertinent Vitals/Pain Pain  Assessment Pain Assessment: Faces Faces Pain Scale: Hurts little more Pain Location: L hip Pain Descriptors / Indicators: Sore, Discomfort, Grimacing, Guarding Pain Intervention(s): Monitored during session, Ice applied     Hand Dominance Right   Extremity/Trunk Assessment Upper Extremity Assessment Upper Extremity Assessment: Overall WFL for tasks assessed   Lower Extremity Assessment Lower Extremity Assessment: Defer to PT evaluation   Cervical / Trunk Assessment Cervical / Trunk Assessment: Kyphotic   Communication Communication Communication: No difficulties   Cognition Arousal/Alertness: Awake/alert Behavior During Therapy: WFL for tasks assessed/performed Overall Cognitive Status: Within Functional Limits for tasks assessed                                       General Comments       Exercises     Shoulder Instructions      Home Living Family/patient expects to be discharged to:: Private residence Living Arrangements: Spouse/significant other Available Help at Discharge: Family;Available 24 hours/day Type of Home: House Home Access: Stairs to enter [employer] of Steps: 1 Entrance Stairs-Rails: None Home Layout: One level     Bathroom Shower/Tub: Chief Strategy Officer: Standard         Additional Comments: daughter is in town to help and son lives nearby      Prior Functioning/Environment Prior Level of Function : Independent/Modified Independent               ADLs Comments: gets down in tub, works together with husband on housekeeping and meal prep        OT Problem List: Decreased strength;Impaired balance (sitting and/or standing);Decreased knowledge of use of DME or AE;Pain      OT Treatment/Interventions: Self-care/ADL training;DME and/or AE instruction;Therapeutic activities;Patient/family education;Balance training    OT Goals(Current goals can be found in the care plan section) Acute Rehab  OT Goals OT Goal Formulation: With patient Time For Goal Achievement: 10/16/21 Potential to Achieve Goals: Good ADL Goals Pt Will Perform Grooming: with min guard assist;standing Pt Will Perform Lower Body Bathing: with min assist;with adaptive equipment;sit to/from stand Pt Will Perform Lower Body Dressing: with min assist;with adaptive equipment;sit to/from stand Pt Will Transfer to Toilet: with supervision;ambulating;bedside commode (over toilet) Pt Will Perform Toileting - Clothing Manipulation and hygiene: with supervision;sit to/from stand Additional ADL Goal #1: Pt will perform bed mobility with min assist in preparation for ADL with caregiver's assist.  OT Frequency: Min 3X/week    Co-evaluation PT/OT/SLP Co-Evaluation/Treatment: Yes Reason for Co-Treatment: For patient/therapist safety          AM-PAC OT "6 Clicks" Daily Activity     Outcome Measure Help from another person eating meals?: None Help from another person taking care of personal grooming?: A Little Help from another person toileting, which includes using toliet, bedpan, or urinal?: A Lot Help from another person bathing (including washing, rinsing, drying)?: A Lot Help from another person to put on and taking off regular upper body clothing?: A Little Help from another person to put on and taking off regular lower body clothing?: A Lot 6 Click Score: 16   End of Session Equipment Utilized During Treatment: Gait belt;Rolling walker (2 wheels) Nurse Communication: Mobility status  Activity Tolerance: Patient tolerated treatment well Patient left: in chair;with call bell/phone within reach;with chair alarm set  OT Visit Diagnosis: Unsteadiness on feet (R26.81);Other abnormalities of gait and mobility (R26.89);Pain;Muscle weakness (generalized) (M62.81) Pain - Right/Left: Left Pain - part of body: Hip                Time: 6644-0347 OT Time Calculation (min): 30 min Charges:  OT General Charges $OT Visit: 1  Visit OT Evaluation $OT Eval Moderate Complexity: 1 Mod  Martie Round, OTR/L Acute Rehabilitation Services Pager: [phone] Office: [phone]   Evern Bio 10/02/2021, 9:28 AM

## 2021-10-03 ENCOUNTER — Inpatient Hospital Stay (HOSPITAL_COMMUNITY): Payer: Medicare Other

## 2021-10-03 DIAGNOSIS — R011 Cardiac murmur, unspecified: Secondary | ICD-10-CM | POA: Diagnosis not present

## 2021-10-03 DIAGNOSIS — S72142A Displaced intertrochanteric fracture of left femur, initial encounter for closed fracture: Secondary | ICD-10-CM | POA: Diagnosis not present

## 2021-10-03 DIAGNOSIS — Z8616 Personal history of COVID-19: Secondary | ICD-10-CM

## 2021-10-03 DIAGNOSIS — W010XXA Fall on same level from slipping, tripping and stumbling without subsequent striking against object, initial encounter: Secondary | ICD-10-CM | POA: Diagnosis not present

## 2021-10-03 LAB — ECHOCARDIOGRAM COMPLETE
AR max vel: 1.66 cm2
AV Area VTI: 1.61 cm2
AV Area mean vel: 1.57 cm2
AV Mean grad: 9.5 mmHg
AV Peak grad: 17.4 mmHg
Ao pk vel: 2.09 m/s
Area-P 1/2: 4.21 cm2
Calc EF: 71.1 %
Height: 61.5 in
MV M vel: 4.86 m/s
MV Peak grad: 94.5 mmHg
MV VTI: 2.58 cm2
S' Lateral: 2.2 cm
Single Plane A2C EF: 71.9 %
Single Plane A4C EF: 68.3 %
Weight: 1760.01 oz

## 2021-10-03 LAB — TYPE AND SCREEN
ABO/RH(D): O POS
Antibody Screen: NEGATIVE
Unit division: 0
Unit division: 0

## 2021-10-03 LAB — BASIC METABOLIC PANEL
Anion gap: 6 (ref 5–15)
BUN: 14 mg/dL (ref 8–23)
CO2: 24 mmol/L (ref 22–32)
Calcium: 8 mg/dL — ABNORMAL LOW (ref 8.9–10.3)
Chloride: 103 mmol/L (ref 98–111)
Creatinine, Ser: 0.74 mg/dL (ref 0.44–1.00)
GFR, Estimated: >60 mL/min (ref >60)
Glucose, Bld: 92 mg/dL (ref 70–99)
Potassium: 4.5 mmol/L (ref 3.5–5.1)
Sodium: 133 mmol/L — ABNORMAL LOW (ref 135–145)

## 2021-10-03 LAB — BPAM RBC
Blood Product Expiration Date: 202303012359
Blood Product Expiration Date: 202303242359
ISSUE DATE / TIME: 202302231102
ISSUE DATE / TIME: 202302241033
Unit Type and Rh: 5100
Unit Type and Rh: 5100

## 2021-10-03 LAB — CBC
HCT: 27.5 % — ABNORMAL LOW (ref 36.0–46.0)
Hemoglobin: 9 g/dL — ABNORMAL LOW (ref 12.0–15.0)
MCH: 29.8 pg (ref 26.0–34.0)
MCHC: 32.7 g/dL (ref 30.0–36.0)
MCV: 91.1 fL (ref 80.0–100.0)
Platelets: 129 10*3/uL — ABNORMAL LOW (ref 150–400)
RBC: 3.02 MIL/uL — ABNORMAL LOW (ref 3.87–5.11)
RDW: 14.5 % (ref 11.5–15.5)
WBC: 15.1 10*3/uL — ABNORMAL HIGH (ref 4.0–10.5)
nRBC: 0 % (ref 0.0–0.2)

## 2021-10-03 NOTE — Progress Notes (Signed)
°  Progress Note   Patient: Sandra Mclaughlin [id number] DOB: 1931-07-07 DOA: 09/30/2021     3 DOS: the patient was seen and examined on 10/03/2021   Brief hospital course: Sandra Mclaughlin is a 88 y.o. female with medical history significant of hypertension, glaucoma, degenerative disc disease, chronic pain, neuropathy, diverticulosis, colitis presenting after a fall with hip pain.   Found to have a left femur fracture.  S/p OR on 2/23. Progressive well with PT -- may be able to go home - PT will see over the weekend.    Assessment and Plan: * Closed comminuted intertrochanteric fracture of left femur ([facility])- (present on admission) Fall- mechanical in her yard on an uneven surface.   > X-ray in the ED showed comminuted intertrochanteric fracture of the left femur. -orthopedic consult -s/p surgery on 2/23 - careful with opioids considering her blood pressure drop after initial administration and apparent opioid sensitive patient -patient did well with PT/OT    History of COVID-19 Patient told Dr. Trilby Drummer that she had already tested positive for COVID twice this month.  This is likely a continuation of those prior positive test and not a new infection.  Likely does not require precautions.  Leukocytosis > Unclear etiology, possibly reactive in the setting of fall and femur fracture. > Chest x-ray clear.  Urinalysis pending but no symptoms. > Patient denies fevers or chills or other infectious symptoms - no fever- no cough, no dysuria  Anemia > Hemoglobin incidentally noted to be 9.8 in the ED down from previous baseline of 13 several months ago. > No clear evidence of bleeding, no hematoma from hip fracture - Type and screen already ordered in the ED- plan to give 1 unit PRBC prior to surgery 2/23 -another unit 2/24 -total of 2 units  Peripheral neuropathy - Continue home gabapentin  Osteoporosis with fracture- (present on admission) > Now with fracture as above - Continue calcium and  vitamin D  Glaucoma of both eyes- (present on admission) - Continue home eyedrops  Primary hypertension- (present on admission) - Holding home amlodipine and lisinopril in the setting of transient hypotension     ? Home once BP stable and Hgb stable-- patient to get PT over the weekend  Subjective: no complaints-- using the bathroom, no dysuria  Physical Exam: Vitals:   10/02/21 2024 10/03/21 0456 10/03/21 0746 10/03/21 1100  BP: 129/68 (!) 99/53 97/86   Pulse: 83 78 74   Resp: 15  15   Temp: 98.7 F (37.1 C)  99 F (37.2 C)   TempSrc: Oral  Oral   SpO2: 97% 98% 95% 95%  Weight:      Height:        General: Appearance:    Well developed, well nourished female in no acute distress     Lungs:     respirations unlabored  Heart:    Normal heart rate.    MS:   All extremities are intact.    Neurologic:   Awake, alert, oriented x 3.     Data Reviewed: Hgb 9 WBC stable around 15    Disposition: Status is: Inpatient Remains inpatient appropriate because: home health if Hgb abd BP stable          Planned Discharge Destination: Home with Home Health    Time spent: 75 minutes  Author: Geradine Girt, DO 10/03/2021 3:13 PM  For on call review [email or web address].

## 2021-10-03 NOTE — Progress Notes (Signed)
Occupational Therapy Treatment Patient Details Name: Sandra Mclaughlin MRN: [id number] DOB: 1930/12/23 Today's Date: 10/03/2021   History of present illness Pt is an 88 y.o. female admitted 09/30/21 after a fall in her yard sustaining closed comminuted intertrochanteric L femur fx. S/p L hip IMN 2/23. PMH includes hernia repair, spinal cord stimulator.   OT comments  Pt seen today for session, progressing towards goals. Pt min-max A for toileting and LB dressing ADLs, min A for bed mobility and transfers with RW. Pt's orthostatics taken throughout session, 93/80 sitting on BSC for ~5 min, 93/53 standing for ~2 mins, and 117/56 when returned supine in bed. Pt denies dizziness/lightheadedness during mobility. Pt presenting with impairments listed below, will follow acutely. Continue to recommend HHOT at d/c.   Recommendations for follow up therapy are one component of a multi-disciplinary discharge planning process, led by the attending physician.  Recommendations may be updated based on patient status, additional functional criteria and insurance authorization.    Follow Up Recommendations  Home health OT    Assistance Recommended at Discharge Frequent or constant Supervision/Assistance  Patient can return home with the following  A little help with walking and/or transfers;A little help with bathing/dressing/bathroom;Assistance with cooking/housework;Direct supervision/assist for medications management;Assist for transportation;Help with stairs or ramp for entrance   Equipment Recommendations  BSC/3in1    Recommendations for Other Services      Precautions / Restrictions Precautions Precautions: Fall Restrictions Weight Bearing Restrictions: Yes LLE Weight Bearing: Weight bearing as tolerated       Mobility Bed Mobility Overal bed mobility: Needs Assistance Bed Mobility: Supine to Sit     Supine to sit: HOB elevated, Min assist     General bed mobility comments: min A to return  BLE into bed    Transfers Overall transfer level: Needs assistance Equipment used: Rolling walker (2 wheels) Transfers: Sit to/from Stand, Bed to chair/wheelchair/BSC Sit to Stand: Min assist     Step pivot transfers: Min assist     General transfer comment: pt requires increased time/cuing     Balance Overall balance assessment: Needs assistance Sitting-balance support: No upper extremity supported, Feet supported Sitting balance-Leahy Scale: Fair     Standing balance support: Reliant on assistive device for balance Standing balance-Leahy Scale: Poor Standing balance comment: alble to release L hand from RW in standing  for BP measurement                           ADL either performed or assessed with clinical judgement   ADL Overall ADL's : Needs assistance/impaired                         Toilet Transfer: Minimal assistance;Stand-pivot;BSC/3in1;Rolling walker (2 wheels)   Toileting- Clothing Manipulation and Hygiene: Maximal assistance;Sit to/from stand Toileting - Clothing Manipulation Details (indicate cue type and reason): to don socks and undergarment after using BSC     Functional mobility during ADLs: Minimal assistance;Rolling walker (2 wheels)      Extremity/Trunk Assessment Upper Extremity Assessment Upper Extremity Assessment: Overall WFL for tasks assessed   Lower Extremity Assessment Lower Extremity Assessment: Defer to PT evaluation        Vision   Vision Assessment?: No apparent visual deficits   Perception Perception Perception: Not tested   Praxis Praxis Praxis: Not tested    Cognition Arousal/Alertness: Awake/alert Behavior During Therapy: WFL for tasks assessed/performed Overall Cognitive Status: Within Functional Limits for  tasks assessed                                          Exercises      Shoulder Instructions       General Comments VSS on RA, family present towards end of session     Pertinent Vitals/ Pain       Pain Assessment Pain Assessment: Faces Pain Score: 2  Faces Pain Scale: Hurts a little bit Pain Location: L hip Pain Descriptors / Indicators: Sore, Discomfort, Grimacing, Guarding Pain Intervention(s): Limited activity within patient's tolerance, Monitored during session, Repositioned  Home Living                                          Prior Functioning/Environment              Frequency  Min 3X/week        Progress Toward Goals  OT Goals(current goals can now be found in the care plan section)  Progress towards OT goals: Progressing toward goals  Acute Rehab OT Goals OT Goal Formulation: With patient Time For Goal Achievement: 10/16/21 Potential to Achieve Goals: Good ADL Goals Pt Will Perform Grooming: with min guard assist;standing Pt Will Perform Lower Body Bathing: with min assist;with adaptive equipment;sit to/from stand Pt Will Perform Lower Body Dressing: with min assist;with adaptive equipment;sit to/from stand Pt Will Transfer to Toilet: with supervision;ambulating;bedside commode Pt Will Perform Toileting - Clothing Manipulation and hygiene: with supervision;sit to/from stand Additional ADL Goal #1: Pt will perform bed mobility with min assist in preparation for ADL with caregiver's assist.  Plan Discharge plan remains appropriate;Frequency remains appropriate    Co-evaluation                 AM-PAC OT "6 Clicks" Daily Activity     Outcome Measure   Help from another person eating meals?: None Help from another person taking care of personal grooming?: A Little Help from another person toileting, which includes using toliet, bedpan, or urinal?: A Lot Help from another person bathing (including washing, rinsing, drying)?: A Lot Help from another person to put on and taking off regular upper body clothing?: A Little Help from another person to put on and taking off regular lower body  clothing?: A Lot 6 Click Score: 16    End of Session Equipment Utilized During Treatment: Gait belt;Rolling walker (2 wheels)  OT Visit Diagnosis: Unsteadiness on feet (R26.81);Other abnormalities of gait and mobility (R26.89);Pain;Muscle weakness (generalized) (M62.81) Pain - Right/Left: Left Pain - part of body: Hip   Activity Tolerance Patient tolerated treatment well   Patient Left in bed;with call bell/phone within reach;with bed alarm set   Nurse Communication Mobility status        Time: 6283-1517 OT Time Calculation (min): 20 min  Charges: OT General Charges $OT Visit: 1 Visit OT Treatments $Self Care/Home Management : 8-22 mins  Alfonzo Beers, OTD, OTR/L Acute Rehab [phone]   Sandra Mclaughlin 10/03/2021, 4:09 PM

## 2021-10-03 NOTE — Assessment & Plan Note (Addendum)
Patient told Dr. Alinda Money that she had already tested positive for COVID twice this month.-- first positive: 2/1  This is likely a continuation of those prior positive test and not a new infection. - does not require precautions.

## 2021-10-03 NOTE — Progress Notes (Signed)
Physical Therapy Treatment Patient Details Name: Sandra Mclaughlin MRN: [id number] DOB: 07/07/1931 Today's Date: 10/03/2021   History of Present Illness Pt is an 88 y.o. female admitted 09/30/21 after a fall in her yard sustaining closed comminuted intertrochanteric L femur fx. S/p L hip IMN 2/23. PMH includes hernia repair, spinal cord stimulator.    PT Comments    Pt is progressing well towards goals. She progressed to short distance hallway ambulation with chair to follow for safety. Checked Orthostatic vitals, with BP remaining WNL. Session performed on RA with SpO2 remaining above 94%. Current plan remains appropriate. Will continue to follow acutely.    10/03/21 1100  Orthostatic Lying   BP- Lying 111/66  Pulse- Lying 80  Orthostatic Sitting  BP- Sitting 113/78  Pulse- Sitting 90  Orthostatic Standing at 0 minutes  BP- Standing at 0 minutes 126/69  Pulse- Standing at 0 minutes 86  Orthostatic Standing at 3 minutes  BP- Standing at 3 minutes 116/70  Pulse- Standing at 3 minutes 88  Oxygen Therapy  SpO2 95 %  O2 Device Room Air     Recommendations for follow up therapy are one component of a multi-disciplinary discharge planning process, led by the attending physician.  Recommendations may be updated based on patient status, additional functional criteria and insurance authorization.  Follow Up Recommendations  Home health PT     Assistance Recommended at Discharge Frequent or constant Supervision/Assistance  Patient can return home with the following A little help with walking and/or transfers;A little help with bathing/dressing/bathroom;Assistance with cooking/housework;Assist for transportation;Help with stairs or ramp for entrance   Equipment Recommendations  Rolling walker (2 wheels);BSC/3in1;Wheelchair (measurements PT);Wheelchair cushion (measurements PT) (likely youth-sized RW)    Recommendations for Other Services       Precautions / Restrictions  Precautions Precautions: Fall Restrictions Weight Bearing Restrictions: Yes LLE Weight Bearing: Weight bearing as tolerated     Mobility  Bed Mobility Overal bed mobility: Needs Assistance Bed Mobility: Supine to Sit     Supine to sit: HOB elevated, Min assist     General bed mobility comments: min A for L LE managment off bed. Pt able to progress hips well. Increased time and effort with HOB elevated and use of bed rails.    Transfers Overall transfer level: Needs assistance Equipment used: Rolling walker (2 wheels) Transfers: Sit to/from Stand Sit to Stand: Min assist           General transfer comment: min A with cues for weight shift and hand placement.    Ambulation/Gait Ambulation/Gait assistance: +2 safety/equipment, Min guard (chair follow) Gait Distance (Feet): 50 Feet Assistive device: Rolling walker (2 wheels) Gait Pattern/deviations: Step-to pattern, Decreased weight shift to left, Antalgic, Trunk flexed Gait velocity: Decrease     General Gait Details: Slow, antalgic gait with RW. Cues for RW proximity, postural control and increased heel strike. Ambulated on RA with SpO2 remaining >94%   Stairs             Wheelchair Mobility    Modified Rankin (Stroke Patients Only)       Balance Overall balance assessment: Needs assistance Sitting-balance support: No upper extremity supported, Feet supported Sitting balance-Leahy Scale: Fair     Standing balance support: Reliant on assistive device for balance Standing balance-Leahy Scale: Poor Standing balance comment: B UE and external support, unable to release walker in static standing  Cognition Arousal/Alertness: Awake/alert Behavior During Therapy: WFL for tasks assessed/performed Overall Cognitive Status: Within Functional Limits for tasks assessed                                          Exercises      General Comments General  comments (skin integrity, edema, etc.): Daughter and father present      Pertinent Vitals/Pain Pain Assessment Faces Pain Scale: Hurts little more Pain Location: L hip Pain Descriptors / Indicators: Sore, Discomfort, Grimacing, Guarding    Home Living                          Prior Function            PT Goals (current goals can now be found in the care plan section) Acute Rehab PT Goals Patient Stated Goal: return home PT Goal Formulation: With patient Time For Goal Achievement: 10/16/21 Potential to Achieve Goals: Good Progress towards PT goals: Progressing toward goals    Frequency    Min 5X/week      PT Plan Current plan remains appropriate    Co-evaluation              AM-PAC PT "6 Clicks" Mobility   Outcome Measure  Help needed turning from your back to your side while in a flat bed without using bedrails?: A Little Help needed moving from lying on your back to sitting on the side of a flat bed without using bedrails?: A Little Help needed moving to and from a bed to a chair (including a wheelchair)?: A Little Help needed standing up from a chair using your arms (e.g., wheelchair or bedside chair)?: A Little Help needed to walk in hospital room?: A Little Help needed climbing 3-5 steps with a railing? : A Lot 6 Click Score: 17    End of Session Equipment Utilized During Treatment: Gait belt Activity Tolerance: Patient tolerated treatment well Patient left: with call bell/phone within reach;in chair;with family/visitor present Nurse Communication: Mobility status PT Visit Diagnosis: Other abnormalities of gait and mobility (R26.89);Muscle weakness (generalized) (M62.81);Pain Pain - Right/Left: Left Pain - part of body: Hip     Time: [id number] PT Time Calculation (min) (ACUTE ONLY): 61 min  Charges:  $Gait Training: 23-37 mins $Therapeutic Activity: 23-37 mins                     Benjiman Core, [place] Pager [phone] Acute  Rehab  Allena Katz 10/03/2021, 12:20 PM

## 2021-10-04 ENCOUNTER — Inpatient Hospital Stay (HOSPITAL_COMMUNITY): Payer: Medicare Other

## 2021-10-04 DIAGNOSIS — S72142A Displaced intertrochanteric fracture of left femur, initial encounter for closed fracture: Secondary | ICD-10-CM | POA: Diagnosis not present

## 2021-10-04 DIAGNOSIS — D72829 Elevated white blood cell count, unspecified: Secondary | ICD-10-CM | POA: Diagnosis not present

## 2021-10-04 DIAGNOSIS — R509 Fever, unspecified: Secondary | ICD-10-CM

## 2021-10-04 LAB — CBC
HCT: 27.3 % — ABNORMAL LOW (ref 36.0–46.0)
Hemoglobin: 9.1 g/dL — ABNORMAL LOW (ref 12.0–15.0)
MCH: 29.8 pg (ref 26.0–34.0)
MCHC: 33.3 g/dL (ref 30.0–36.0)
MCV: 89.5 fL (ref 80.0–100.0)
Platelets: 168 10*3/uL (ref 150–400)
RBC: 3.05 MIL/uL — ABNORMAL LOW (ref 3.87–5.11)
RDW: 14.5 % (ref 11.5–15.5)
WBC: 13.2 10*3/uL — ABNORMAL HIGH (ref 4.0–10.5)
nRBC: 0 % (ref 0.0–0.2)

## 2021-10-04 LAB — BASIC METABOLIC PANEL
Anion gap: 6 (ref 5–15)
BUN: 13 mg/dL (ref 8–23)
CO2: 25 mmol/L (ref 22–32)
Calcium: 7.8 mg/dL — ABNORMAL LOW (ref 8.9–10.3)
Chloride: 104 mmol/L (ref 98–111)
Creatinine, Ser: 0.61 mg/dL (ref 0.44–1.00)
GFR, Estimated: >60 mL/min (ref >60)
Glucose, Bld: 85 mg/dL (ref 70–99)
Potassium: 4.1 mmol/L (ref 3.5–5.1)
Sodium: 135 mmol/L (ref 135–145)

## 2021-10-04 NOTE — Progress Notes (Signed)
°  Progress Note   Patient: Sandra Mclaughlin [id number] DOB: [birth year] DOA: 09/30/2021     4 DOS: the patient was seen and examined on 10/04/2021   Brief hospital course: Sandra Mclaughlin is a 88 y.o. female with medical history significant of hypertension, glaucoma, degenerative disc disease, chronic pain, neuropathy, diverticulosis, colitis presenting after a fall with hip pain.   Found to have a left femur fracture.  S/p OR on 2/23.  Initially progressive well with PT but stay have been complicated by hypotension, anemia and "low grade" fever on scheduled tylenol.  Assessment and Plan: * Closed comminuted intertrochanteric fracture of left femur ([facility])- (present on admission) Fall- mechanical in her yard on an uneven surface.   > X-ray in the ED showed comminuted intertrochanteric fracture of the left femur. -orthopedic consult -s/p surgery on 2/23 - careful with opioids considering her blood pressure drop after initial administration and apparent opioid sensitive patient -patient did well with PT/OT but probably needs SNF    History of COVID-19 Patient told Dr. Trilby Mclaughlin that she had already tested positive for COVID twice this month.-- first positive: 2/1  This is likely a continuation of those prior positive test and not a new infection. - does not require precautions.  Leukocytosis > Unclear etiology, possibly reactive in the setting of fall and femur fracture. > Chest x-ray clear.  Urinalysis pending but no symptoms. Low grade fever but on scheduled tylenol- ua not done, x ray w/o PNA, check blood cultures, doubt COVID as positive test was 2/1 - no fever- no cough, no dysuria -trending down  Anemia > Hemoglobin incidentally noted to be 9.8 in the ED down from previous baseline of 13 several months ago. > No clear evidence of bleeding, no hematoma from hip fracture - Type and screen already ordered in the ED- plan to give 1 unit PRBC prior to surgery 2/23 -another unit 2/24 -total of  2 units  Peripheral neuropathy - Continue home gabapentin  Osteoporosis with fracture- (present on admission) > Now with fracture as above - Continue calcium and vitamin D  Glaucoma of both eyes- (present on admission) - Continue home eyedrops  Primary hypertension- (present on admission) - Holding home amlodipine and lisinopril in the setting of transient hypotension       Subjective: says she feels bad all over  Physical Exam: Vitals:   10/04/21 0500 10/04/21 0900 10/04/21 0917 10/04/21 0931  BP: 110/64  113/61 107/68  Pulse: 78 82 98 98  Resp: 18 18 18 20   Temp: 99.2 F (37.3 C)  99.5 F (37.5 C)   TempSrc: Oral  Oral   SpO2:  94% 94% 94%  Weight:      Height:        General: Appearance:    elderlyfemale in no acute distress     Lungs:     respirations unlabored  Heart:    Normal heart rate.   MS:   All extremities are intact.    Neurologic:   Awake, alert, oriented x 3. No apparent focal neurological           defect.       Data Reviewed: Hgb 13.2    Disposition: Status is: Inpatient Remains inpatient appropriate because: needs SNF placement          Planned Discharge Destination: Home   Time spent: 75 minutes  Author: Geradine Girt, DO 10/04/2021 3:01 PM  For on call review [email or web address].

## 2021-10-04 NOTE — Progress Notes (Signed)
Mobility Specialist Progress Note    10/04/21 1624  Mobility  Activity Contraindicated/medical hold   Attempted x2 and patient feeling lethargic first then asleep the second time. Will f/u tomorrow as schedule permits.   [facility] Mobility Specialist  M.S. 5N: [phone]

## 2021-10-04 NOTE — Progress Notes (Signed)
Physical Therapy Treatment Patient Details Name: Sandra Mclaughlin MRN: [id number] DOB: 1930-08-14 Today's Date: 10/04/2021   History of Present Illness Pt is an 88 y.o. female admitted 09/30/21 after a fall in her yard sustaining closed comminuted intertrochanteric L femur fx. S/p L hip IMN 2/23. PMH includes hernia repair, spinal cord stimulator.    PT Comments    Continuing work on functional mobility and activity tolerance;  Pt was very fatigued today; described feeling "tired", and requested to stay in the bed; Provided encouragement and education re: benefits of mobilizing -- even if it's just to get OOB, and she agreed to getting up; Assisted pt to [facility] and back, and able to get another set of orthostatic BPs, as follows:    10/04/21 0900  Orthostatic Lying   BP- Lying 113/61  Pulse- Lying 91  Orthostatic Sitting  BP- Sitting 140/65  Pulse- Sitting 85  Orthostatic Standing at 0 minutes  BP- Standing at 0 minutes 116/74  Pulse- Standing at 0 minutes 91  Orthostatic Standing at 3 minutes  BP- Standing at 3 minutes 107/68  Pulse- Standing at 3 minutes 92   The higher SBP in sitting is potentially related to stress about getting to the [facility] in time;   Dr. Eliseo Squires reached out to me earlier in the am to let me know the family is interested in pursuing SNF for post-acute rehab; This is quite reasonable, given slow progress, and I am happy to support SNF for rehab to maximize independence and safety with mobility and ADLs prior to dc home; Did not have the opportunity to discuss with pt and family due to nature of session and bathroom needs; will update Care Team.  Recommendations for follow up therapy are one component of a multi-disciplinary discharge planning process, led by the attending physician.  Recommendations may be updated based on patient status, additional functional criteria and insurance authorization.  Follow Up Recommendations  Other (comment) (At family's request, will consider  SNF for post-acute reahb)     Assistance Recommended at Discharge Frequent or constant Supervision/Assistance  Patient can return home with the following A little help with walking and/or transfers;A little help with bathing/dressing/bathroom;Assistance with cooking/housework;Assist for transportation;Help with stairs or ramp for entrance   Equipment Recommendations  Rolling walker (2 wheels);BSC/3in1;Wheelchair (measurements PT);Wheelchair cushion (measurements PT) (likely youth-sized RW)    Recommendations for Other Services       Precautions / Restrictions Precautions Precautions: Fall Restrictions Weight Bearing Restrictions: Yes LLE Weight Bearing: Weight bearing as tolerated     Mobility  Bed Mobility Overal bed mobility: Needs Assistance Bed Mobility: Supine to Sit     Supine to sit: HOB elevated, Min assist     General bed mobility comments: Min assist and step-by-step cues for technqiue    Transfers Overall transfer level: Needs assistance Equipment used: Rolling walker (2 wheels) Transfers: Sit to/from Stand, Bed to chair/wheelchair/BSC Sit to Stand: Min assist   Step pivot transfers: Min assist       General transfer comment: pt requires increased time/cueing for hand placement; slow, but steady rise; Painful L hip in stance when taking steps bed to [facility], and then BSC back to bed    Ambulation/Gait               General Gait Details: Pt clearly requesting not to walk today, despite lots of encouragement, so deferred Medical sales representative  Modified Rankin (Stroke Patients Only)       Balance     Sitting balance-Leahy Scale: Fair       Standing balance-Leahy Scale: Poor Standing balance comment: alble to release L hand from RW in standing  for BP measurement                            Cognition Arousal/Alertness: Awake/alert Behavior During Therapy: WFL for tasks assessed/performed Overall  Cognitive Status: Within Functional Limits for tasks assessed                                          Exercises Total Joint Exercises Standing Hip Extension: AROM, Left, Standing Other Exercises Other Exercises: Standing hip and knee flexion x 10 reps    General Comments General comments (skin integrity, edema, etc.): Husband present for session; RN in and helped with hygeine and documenting orthsotatics      Pertinent Vitals/Pain Pain Assessment Pain Assessment: Faces Faces Pain Scale: Hurts little more Pain Location: L hip Pain Descriptors / Indicators: Sore, Discomfort, Grimacing, Guarding Pain Intervention(s): Monitored during session    Home Living                          Prior Function            PT Goals (current goals can now be found in the care plan section) Acute Rehab PT Goals Patient Stated Goal: reports exhaustion today; asking to stay in the bed until shee needed to move her bowels PT Goal Formulation: With patient Time For Goal Achievement: 10/16/21 Potential to Achieve Goals: Good Progress towards PT goals: Progressing toward goals (though very fatigued today)    Frequency    Min 4X/week      PT Plan Other (comment);Frequency needs to be updated (Dr. Eliseo Squires reached out to PT earlier to let us know that pt's family is requesting to consider post-acute rehab at SNF level)    Co-evaluation              AM-PAC PT "6 Clicks" Mobility   Outcome Measure  Help needed turning from your back to your side while in a flat bed without using bedrails?: A Little Help needed moving from lying on your back to sitting on the side of a flat bed without using bedrails?: A Little Help needed moving to and from a bed to a chair (including a wheelchair)?: A Little Help needed standing up from a chair using your arms (e.g., wheelchair or bedside chair)?: A Little Help needed to walk in hospital room?: A Little Help needed climbing 3-5  steps with a railing? : A Lot 6 Click Score: 17    End of Session Equipment Utilized During Treatment: Gait belt Activity Tolerance: Patient tolerated treatment well Patient left: in bed;with call bell/phone within reach;with nursing/sitter in room;Other (comment) (sitting EOB) Nurse Communication: Mobility status PT Visit Diagnosis: Other abnormalities of gait and mobility (R26.89);Muscle weakness (generalized) (M62.81);Pain Pain - Right/Left: Left Pain - part of body: Hip     Time: [id number] PT Time Calculation (min) (ACUTE ONLY): 25 min  Charges:  $Therapeutic Activity: 23-37 mins                     Roney Marion, [place] Pager [phone]  Office [place] 10/04/2021, 11:59 AM

## 2021-10-05 DIAGNOSIS — S72142A Displaced intertrochanteric fracture of left femur, initial encounter for closed fracture: Secondary | ICD-10-CM | POA: Diagnosis not present

## 2021-10-05 DIAGNOSIS — W010XXA Fall on same level from slipping, tripping and stumbling without subsequent striking against object, initial encounter: Secondary | ICD-10-CM | POA: Diagnosis not present

## 2021-10-05 LAB — CBC
HCT: 31.9 % — ABNORMAL LOW (ref 36.0–46.0)
Hemoglobin: 10.5 g/dL — ABNORMAL LOW (ref 12.0–15.0)
MCH: 29.6 pg (ref 26.0–34.0)
MCHC: 32.9 g/dL (ref 30.0–36.0)
MCV: 89.9 fL (ref 80.0–100.0)
Platelets: 228 10*3/uL (ref 150–400)
RBC: 3.55 MIL/uL — ABNORMAL LOW (ref 3.87–5.11)
RDW: 14.5 % (ref 11.5–15.5)
WBC: 13.4 10*3/uL — ABNORMAL HIGH (ref 4.0–10.5)
nRBC: 0 % (ref 0.0–0.2)

## 2021-10-05 LAB — URINALYSIS, ROUTINE W REFLEX MICROSCOPIC
Bilirubin Urine: NEGATIVE
Glucose, UA: NEGATIVE mg/dL
Hgb urine dipstick: NEGATIVE
Ketones, ur: NEGATIVE mg/dL
Nitrite: NEGATIVE
Protein, ur: NEGATIVE mg/dL
Specific Gravity, Urine: 1.005 (ref 1.005–1.030)
pH: 8 (ref 5.0–8.0)

## 2021-10-05 NOTE — Progress Notes (Signed)
Physical Therapy Treatment Patient Details Name: Sandra Mclaughlin MRN: [id number] DOB: August 25, 1930 Today's Date: 10/05/2021   History of Present Illness Pt is an 88 y.o. female admitted 09/30/21 after a fall in her yard sustaining closed comminuted intertrochanteric L femur fx. S/p L hip IMN 2/23. PMH includes hernia repair, spinal cord stimulator.    PT Comments    Pt progressing slowly towards physical therapy goals. Increased time required for all aspects of mobility. Pt's husband present during session and discussion regarding plan at d/c was had. Pt and husband report anticipating hardship caring for pt safely if she were to return home at her current functional level. Feel SNF level rehab is reasonable to maximize functional independence and safety, and decrease risk for falls prior to return home with her husband. Will continue to follow.    Recommendations for follow up therapy are one component of a multi-disciplinary discharge planning process, led by the attending physician.  Recommendations may be updated based on patient status, additional functional criteria and insurance authorization.  Follow Up Recommendations  Skilled nursing-short term rehab (<3 hours/day)     Assistance Recommended at Discharge Frequent or constant Supervision/Assistance  Patient can return home with the following A little help with walking and/or transfers;A little help with bathing/dressing/bathroom;Assistance with cooking/housework;Assist for transportation;Help with stairs or ramp for entrance   Equipment Recommendations  Rolling walker (2 wheels);BSC/3in1;Wheelchair (measurements PT);Wheelchair cushion (measurements PT) (likely youth-sized RW)    Recommendations for Other Services       Precautions / Restrictions Precautions Precautions: Fall Restrictions Weight Bearing Restrictions: No LLE Weight Bearing: Weight bearing as tolerated     Mobility  Bed Mobility Overal bed mobility: Needs  Assistance Bed Mobility: Sit to Supine       Sit to supine: Mod assist   General bed mobility comments: VC's for sequencing. Assist for LE elevation back up into bed at end of session.    Transfers Overall transfer level: Needs assistance Equipment used: Rolling walker (2 wheels) Transfers: Sit to/from Stand Sit to Stand: Min guard   Step pivot transfers: Min assist       General transfer comment: Increased time. VC's for hand placement on seated surface for safety. Pt required mild assist for walker management during step pivot to the [facility], however no overt LOB noted.    Ambulation/Gait Ambulation/Gait assistance: Min guard, Min assist Gait Distance (Feet): 100 Feet Assistive device: Rolling walker (2 wheels) Gait Pattern/deviations: Step-to pattern, Decreased weight shift to left, Antalgic, Trunk flexed, Step-through pattern Gait velocity: Decrease Gait velocity interpretation: <1.31 ft/sec, indicative of household ambulator   General Gait Details: Pt initially with step-to pattern, progressing nsomewhat to step-through pattern. Pt was cued for increased heel strike, improved posture, forward gaze, and general sequencing with the RW.   Stairs             Wheelchair Mobility    Modified Rankin (Stroke Patients Only)       Balance Overall balance assessment: Needs assistance Sitting-balance support: No upper extremity supported, Feet supported Sitting balance-Leahy Scale: Fair     Standing balance support: No upper extremity supported Standing balance-Leahy Scale: Fair Standing balance comment: Able to stand statically to pull up briefs and fix pad without LOB.                            Cognition Arousal/Alertness: Awake/alert Behavior During Therapy: WFL for tasks assessed/performed Overall Cognitive Status: Within Functional Limits for  tasks assessed                                          Exercises Total Joint  Exercises Long Arc Quad: 15 reps    General Comments        Pertinent Vitals/Pain Pain Assessment Pain Assessment: Faces Faces Pain Scale: Hurts little more Pain Location: L hip Pain Descriptors / Indicators: Sore, Operative site guarding Pain Intervention(s): Limited activity within patient's tolerance, Monitored during session, Repositioned    Home Living                          Prior Function            PT Goals (current goals can now be found in the care plan section) Acute Rehab PT Goals PT Goal Formulation: With patient Time For Goal Achievement: 10/16/21 Potential to Achieve Goals: Good Progress towards PT goals: Progressing toward goals    Frequency    Min 3X/week      PT Plan Discharge plan needs to be updated;Frequency needs to be updated    Co-evaluation              AM-PAC PT "6 Clicks" Mobility   Outcome Measure  Help needed turning from your back to your side while in a flat bed without using bedrails?: A Little Help needed moving from lying on your back to sitting on the side of a flat bed without using bedrails?: A Little Help needed moving to and from a bed to a chair (including a wheelchair)?: A Little Help needed standing up from a chair using your arms (e.g., wheelchair or bedside chair)?: A Little Help needed to walk in hospital room?: A Little Help needed climbing 3-5 steps with a railing? : A Lot 6 Click Score: 17    End of Session Equipment Utilized During Treatment: Gait belt Activity Tolerance: Patient tolerated treatment well Patient left: in bed;with call bell/phone within reach;with bed alarm set;with family/visitor present Nurse Communication: Mobility status PT Visit Diagnosis: Other abnormalities of gait and mobility (R26.89);Muscle weakness (generalized) (M62.81);Pain Pain - Right/Left: Left Pain - part of body: Hip     Time: [id number] PT Time Calculation (min) (ACUTE ONLY): 47 min  Charges:  $Gait  Training: 8-22 mins $Therapeutic Activity: 23-37 mins                     Sandra Mclaughlin, PT, DPT Acute Rehabilitation Services Pager: [phone] Office: [phone]    Sandra Mclaughlin 10/05/2021, 3:26 PM

## 2021-10-05 NOTE — TOC Progression Note (Signed)
Transition of Care [facility]) - Progression Note    Patient Details  Name: RUDENE ANSELL MRN: [id number] Date of Birth: 03/20/1931  Transition of Care [facility]) CM/SW Contact  Joanne Chars, LCSW Phone Number: 10/05/2021, 1:16 PM  Clinical Narrative:   CSW asked to meet with family and pt regarding new plan for SNF.  Permission given by pt to speak with daughter Arbie Cookey [phone]) and son Jenny Reichmann, as well as husband.  They confirmed plan is now for SNF, requesting [facility].  Choice document given.  Pt is vaccinated for covid with 3 boosters.     Expected Discharge Plan: [place]    Expected Discharge Plan and Services Expected Discharge Plan: [place]   Discharge Planning Services: CM Consult Post Acute Care Choice: Durable Medical Equipment, Home Health Living arrangements for the past 2 months: Single Family Home                 DME Arranged: 3-N-1 DME Agency: [employer] Date DME Agency Contacted: 10/02/21 Time DME Agency Contacted: [id number] Representative spoke with at DME Agency: [place]: PT, OT [place] Agency: [place] Date [employer] [place]: 10/02/21 Time [place]: 1453 Representative spoke with at [place]: [place] ([place]) Interventions    Readmission Risk Interventions No flowsheet data found.

## 2021-10-05 NOTE — Progress Notes (Signed)
°  Progress Note   Patient: Sandra Mclaughlin [id number] DOB: 1931/04/30 DOA: 09/30/2021     5 DOS: the patient was seen and examined on 10/05/2021   Brief hospital course: Sandra Mclaughlin is a 88 y.o. female with medical history significant of hypertension, glaucoma, degenerative disc disease, chronic pain, neuropathy, diverticulosis, colitis presenting after a fall with hip pain.   Found to have a left femur fracture.  S/p OR on 2/23.  Initially progressive well with PT but stay have been complicated by hypotension (resolved), anemia (resolved) and "low grade" fever on scheduled tylenol (resolved).  Assessment and Plan: * Closed comminuted intertrochanteric fracture of left femur ([place])- (present on admission) Fall- mechanical in her yard on an uneven surface.   > X-ray in the ED showed comminuted intertrochanteric fracture of the left femur. -orthopedic consult -s/p surgery on 2/23 - careful with opioids considering her blood pressure drop after initial administration and apparent opioid sensitive patient -patient did well with PT/OT but probably needs SNF    History of COVID-19 Patient told Dr. Trilby Drummer that she had already tested positive for COVID twice this month.-- first positive: 2/1  This is likely a continuation of those prior positive test and not a new infection. - does not require precautions.  Leukocytosis > Unclear etiology, possibly reactive in the setting of fall and femur fracture. > Chest x-ray clear.  Urinalysis pending but no symptoms. Low grade fever but on scheduled tylenol- ua not done, x ray w/o PNA, check blood cultures, doubt COVID as positive test was 2/1 - no fever- no cough, no dysuria -trending down  Anemia > Hemoglobin incidentally noted to be 9.8 in the ED down from previous baseline of 13 several months ago. > No clear evidence of bleeding, no hematoma from hip fracture - Type and screen already ordered in the ED- plan to give 1 unit PRBC prior to surgery  2/23 -another unit 2/24 -total of 2 units  Peripheral neuropathy - Continue home gabapentin  Osteoporosis with fracture- (present on admission) > Now with fracture as above - Continue calcium and vitamin D  Glaucoma of both eyes- (present on admission) - Continue home eyedrops  Primary hypertension- (present on admission) - Holding home amlodipine and lisinopril in the setting of transient hypotension       Subjective: feeling better today then yesterday  Physical Exam: Vitals:   10/05/21 0339 10/05/21 0439 10/05/21 0939 10/05/21 1242  BP: 109/62 (!) 124/52 122/69 115/70  Pulse: 72 72 83 83  Resp: 20 19 17 17   Temp: 98.6 F (37 C) 98.1 F (36.7 C)  98 F (36.7 C)  TempSrc: Oral Oral  Oral  SpO2: 97% 97%  94%  Weight:      Height:        General: Appearance:    Well developed, well nourished female in no acute distress     Lungs:     respirations unlabored  Heart:    Normal heart rate.    MS:   All extremities are intact.    Neurologic:   Awake, alert, oriented x 3.      Data Reviewed: Hgb 10.5  Family Communication: at bedside  Disposition: Status is: Inpatient Remains inpatient appropriate because: needs SNF placement          Planned Discharge Destination: Skilled nursing facility    Time spent: 65 minutes  Author: Geradine Girt, DO 10/05/2021 2:02 PM  For on call review [email or web address].

## 2021-10-05 NOTE — Progress Notes (Signed)
PT Cancellation Note  Patient Details Name: Sandra Mclaughlin MRN: [id number] DOB: 04/03/1931   Cancelled Treatment:    Reason Eval/Treat Not Completed: When PT arrived, pt with RN in room passing meds and pt had just received her breakfast tray. Will check back as schedule allows to continue with PT POC.   Marylynn Pearson 10/05/2021, 9:55 AM  Conni Slipper, PT, DPT Acute Rehabilitation Services Pager: [phone] Office: [phone]

## 2021-10-05 NOTE — NC FL2 (Signed)
[place] MEDICAID FL2 LEVEL OF CARE SCREENING TOOL     IDENTIFICATION  Patient Name: Sandra Mclaughlin Birthdate: 1931-07-03 Sex: female Admission Date (Current Location): 09/30/2021  [facility] and [place] Number:  Herbalist and Address:  [facility], [place] [address], [place], [place] [address]      Provider Number: [id number]  Attending Physician Name and Address:  Geradine Girt, DO  Relative Name and Phone Number:  Kristye, Bruhl [facility]) Spouse [phone]    Current Level of Care: Hospital Recommended Level of Care: [place] Prior Approval Number:    Date Approved/Denied:   PASRR Number: [id number]  Discharge Plan: SNF    Current Diagnoses: Patient Active Problem List   Diagnosis Date Noted   History of COVID-19 10/03/2021   Osteoporosis with fracture 09/30/2021   Diverticulitis 09/30/2021   Infectious colitis, enteritis and gastroenteritis 09/30/2021   Peripheral neuropathy 09/30/2021   Closed comminuted intertrochanteric fracture of left femur ([place]) 09/30/2021   Fall at home, initial encounter 09/30/2021   Anemia 09/30/2021   Leukocytosis 09/30/2021   Epigastric pain 04/16/2021   Nausea 04/16/2021   Primary hypertension 04/16/2021   Other constipation 04/16/2021   Chronic pain of left knee 04/16/2021   Chronic right shoulder pain 04/16/2021   Degenerative disc disease, thoracic 04/16/2021   Glaucoma of both eyes 04/16/2021   Aortic atherosclerosis ([place]) 04/16/2021   Neuropathic pain 05/11/2016    Orientation RESPIRATION BLADDER Height & Weight     Self, Time, Situation, Place  O2 Continent Weight: 110 lb (49.9 kg) Height:  5' 1.5" (156.2 cm)  BEHAVIORAL SYMPTOMS/MOOD NEUROLOGICAL BOWEL NUTRITION STATUS      Continent Diet (see discharge summary)  AMBULATORY STATUS COMMUNICATION OF NEEDS Skin   Limited Assist Verbally Other (Comment) (redness, ecchymosis)                       Personal Care  Assistance Level of Assistance  Bathing, Feeding, Dressing Bathing Assistance: Maximum assistance Feeding assistance: Independent Dressing Assistance: Maximum assistance     Functional Limitations Info  Sight, Hearing, Speech Sight Info: Adequate Hearing Info: Adequate Speech Info: Adequate    SPECIAL CARE FACTORS FREQUENCY  PT (By licensed PT), OT (By licensed OT)     PT Frequency: 5x week OT Frequency: 5x week            Contractures Contractures Info: Not present    Additional Factors Info  Code Status, Allergies Code Status Info: partial Allergies Info: ciprofloxacin           Current Medications (10/05/2021):  This is the current hospital active medication list Current Facility-Administered Medications  Medication Dose Route Frequency Provider Last Rate Last Admin   0.9 %  sodium chloride infusion (Manually program via Guardrails IV Fluids)   Intravenous Once Ainsley Spinner, PA-C       acetaminophen (TYLENOL) tablet 1,000 mg  1,000 mg Oral Q8H Vann, Jessica U, DO   1,000 mg at 10/05/21 1308   calcium-vitamin D (OSCAL WITH D) 500-5 MG-MCG per tablet 1 tablet  1 tablet Oral Daily Ainsley Spinner, PA-C   1 tablet at 10/05/21 [id number]   cholecalciferol (VITAMIN D3) tablet 1,000 Units  1,000 Units Oral Daily Ainsley Spinner, PA-C   1,000 Units at 10/05/21 [id number]   dorzolamide-timolol (COSOPT) 22.3-6.8 MG/ML ophthalmic solution 1 drop  1 drop Both Eyes BID Ainsley Spinner, PA-C   1 drop at 10/05/21 0939   enoxaparin (LOVENOX)  injection 40 mg  40 mg Subcutaneous Q24H Ainsley Spinner, PA-C   40 mg at 10/05/21 [id number]   gabapentin (NEURONTIN) capsule 300 mg  300 mg Oral BID Ainsley Spinner, PA-C   300 mg at 10/05/21 [id number]   menthol-cetylpyridinium (CEPACOL) lozenge 3 mg  1 lozenge Oral PRN Ainsley Spinner, PA-C       Or   phenol (CHLORASEPTIC) mouth spray 1 spray  1 spray Mouth/Throat PRN Ainsley Spinner, PA-C       metoCLOPramide (REGLAN) tablet 5-10 mg  5-10 mg Oral Q8H PRN Ainsley Spinner, PA-C       Or    metoCLOPramide (REGLAN) injection 5-10 mg  5-10 mg Intravenous Q8H PRN Ainsley Spinner, PA-C       multivitamin with minerals tablet 1 tablet  1 tablet Oral Q breakfast Ainsley Spinner, PA-C   1 tablet at 10/05/21 [id number]   ondansetron (ZOFRAN) tablet 4 mg  4 mg Oral Q6H PRN Ainsley Spinner, PA-C       Or   ondansetron [facility]) injection 4 mg  4 mg Intravenous Q6H PRN Ainsley Spinner, PA-C       polyethylene glycol (MIRALAX / GLYCOLAX) packet 17 g  17 g Oral Daily PRN Ainsley Spinner, PA-C       traMADol Veatrice [place]) tablet 50 mg  50 mg Oral Q6H PRN Geradine Girt, DO         Discharge Medications: Please see discharge summary for a list of discharge medications.  Relevant Imaging Results:  Relevant Lab Results:   Additional Information SSN: [id number]. Pt is vaccinated for covid with 3 boosters.  Joanne Chars, LCSW

## 2021-10-05 NOTE — Progress Notes (Signed)
Occupational Therapy Treatment Patient Details Name: Sandra Mclaughlin MRN: [id number] DOB: 1931-04-13 Today's Date: 10/05/2021   History of present illness Pt is an 88 y.o. female admitted 09/30/21 after a fall in her yard sustaining closed comminuted intertrochanteric L femur fx. S/p L hip IMN 2/23. PMH includes hernia repair, spinal cord stimulator.   OT comments  Pt awake and readily willing to get OOB. Continues to require significant assist for bed mobility, but only needing min guard assist with RW from elevated surfaces to stand and take steps. Needing set up to moderate assistance for ADLs performed this visit. Pt on 02 over night, but did not need this morning with sats 96-100% on RA. Pt does not feel she can go home as it will only be her husband there to assist, her daughter is not available, updated d/c recommendation to SNF.    Recommendations for follow up therapy are one component of a multi-disciplinary discharge planning process, led by the attending physician.  Recommendations may be updated based on patient status, additional functional criteria and insurance authorization.    Follow Up Recommendations  Skilled nursing-short term rehab (<3 hours/day)    Assistance Recommended at Discharge Frequent or constant Supervision/Assistance  Patient can return home with the following  A little help with walking and/or transfers;A little help with bathing/dressing/bathroom;Assistance with cooking/housework;Direct supervision/assist for medications management;Assist for transportation;Help with stairs or ramp for entrance   Equipment Recommendations  BSC/3in1    Recommendations for Other Services      Precautions / Restrictions Precautions Precautions: Fall Restrictions Weight Bearing Restrictions: Yes LLE Weight Bearing: Weight bearing as tolerated       Mobility Bed Mobility Overal bed mobility: Needs Assistance Bed Mobility: Supine to Sit     Supine to sit: Max assist,  HOB elevated          Transfers Overall transfer level: Needs assistance Equipment used: Rolling walker (2 wheels) Transfers: Sit to/from Stand Sit to Stand: Min guard, From elevated surface           General transfer comment: increased time, self cues for hand placement     Balance Overall balance assessment: Needs assistance   Sitting balance-Leahy Scale: Fair       Standing balance-Leahy Scale: Poor Standing balance comment: able to release walker with one hand                           ADL either performed or assessed with clinical judgement   ADL Overall ADL's : Needs assistance/impaired     Grooming: Wash/dry hands;Wash/dry face;Sitting;Set up           Upper Body Dressing : Minimal assistance;Sitting       Toilet Transfer: Min guard;Stand-pivot;BSC/3in1   Toileting- Clothing Manipulation and Hygiene: Moderate assistance;Sit to/from stand              Extremity/Trunk Assessment              Vision       Perception     Praxis      Cognition Arousal/Alertness: Awake/alert Behavior During Therapy: WFL for tasks assessed/performed Overall Cognitive Status: Within Functional Limits for tasks assessed                                          Exercises      Shoulder Instructions  General Comments      Pertinent Vitals/ Pain       Pain Assessment Pain Assessment: Faces Faces Pain Scale: Hurts little more Pain Location: L hip Pain Descriptors / Indicators: Sore Pain Intervention(s): Ice applied, Repositioned  Home Living                                          Prior Functioning/Environment              Frequency  Min 2X/week        Progress Toward Goals  OT Goals(current goals can now be found in the care plan section)  Progress towards OT goals: Progressing toward goals  Acute Rehab OT Goals OT Goal Formulation: With patient Time For Goal Achievement:  10/16/21 Potential to Achieve Goals: Good  Plan Discharge plan needs to be updated;Frequency needs to be updated    Co-evaluation                 AM-PAC OT "6 Clicks" Daily Activity     Outcome Measure   Help from another person eating meals?: None Help from another person taking care of personal grooming?: A Little Help from another person toileting, which includes using toliet, bedpan, or urinal?: A Lot Help from another person bathing (including washing, rinsing, drying)?: A Lot Help from another person to put on and taking off regular upper body clothing?: A Little Help from another person to put on and taking off regular lower body clothing?: A Lot 6 Click Score: 16    End of Session Equipment Utilized During Treatment: Gait belt;Rolling walker (2 wheels)  OT Visit Diagnosis: Unsteadiness on feet (R26.81);Other abnormalities of gait and mobility (R26.89);Pain;Muscle weakness (generalized) (M62.81) Pain - Right/Left: Left Pain - part of body: Hip   Activity Tolerance Patient tolerated treatment well   Patient Left in chair;with call bell/phone within reach;with chair alarm set   Nurse Communication Mobility status        Time: 8676-1950 OT Time Calculation (min): 25 min  Charges: OT General Charges $OT Visit: 1 Visit OT Treatments $Self Care/Home Management : 23-37 mins  Martie Round, OTR/L Acute Rehabilitation Services Pager: [phone] Office: [phone]   Evern Bio 10/05/2021, 9:09 AM

## 2021-10-05 NOTE — Progress Notes (Signed)
Mobility Specialist Progress Note    10/05/21 1111  Mobility  Bed Position Chair  Activity Ambulated with assistance in room  Level of Assistance Minimal assist, patient does 75% or more  Assistive Device Front wheel walker  LLE Weight Bearing WBAT  Distance Ambulated (ft) 12 ft  Activity Response Tolerated fair  $Mobility charge 1 Mobility   Pt received in chair and agreeable. Had loose BM on [facility] and RN present for pericare. Gait slow and pt c/o not being able to pick up her R foot as much as she is used to. Pt returned to chair with call bell in reach and family present.   [facility] Mobility Specialist  M.S. 5N: [phone]

## 2021-10-06 DIAGNOSIS — D72829 Elevated white blood cell count, unspecified: Secondary | ICD-10-CM | POA: Diagnosis not present

## 2021-10-06 DIAGNOSIS — R509 Fever, unspecified: Secondary | ICD-10-CM | POA: Diagnosis not present

## 2021-10-06 DIAGNOSIS — N390 Urinary tract infection, site not specified: Secondary | ICD-10-CM | POA: Diagnosis not present

## 2021-10-06 DIAGNOSIS — R262 Difficulty in walking, not elsewhere classified: Secondary | ICD-10-CM | POA: Diagnosis not present

## 2021-10-06 DIAGNOSIS — Z8616 Personal history of COVID-19: Secondary | ICD-10-CM | POA: Diagnosis not present

## 2021-10-06 DIAGNOSIS — D649 Anemia, unspecified: Secondary | ICD-10-CM | POA: Diagnosis not present

## 2021-10-06 DIAGNOSIS — G609 Hereditary and idiopathic neuropathy, unspecified: Secondary | ICD-10-CM | POA: Diagnosis not present

## 2021-10-06 DIAGNOSIS — G629 Polyneuropathy, unspecified: Secondary | ICD-10-CM | POA: Diagnosis not present

## 2021-10-06 DIAGNOSIS — M6281 Muscle weakness (generalized): Secondary | ICD-10-CM | POA: Diagnosis not present

## 2021-10-06 DIAGNOSIS — W010XXA Fall on same level from slipping, tripping and stumbling without subsequent striking against object, initial encounter: Secondary | ICD-10-CM | POA: Diagnosis not present

## 2021-10-06 DIAGNOSIS — G894 Chronic pain syndrome: Secondary | ICD-10-CM | POA: Diagnosis not present

## 2021-10-06 DIAGNOSIS — S72142A Displaced intertrochanteric fracture of left femur, initial encounter for closed fracture: Secondary | ICD-10-CM | POA: Diagnosis not present

## 2021-10-06 DIAGNOSIS — A09 Infectious gastroenteritis and colitis, unspecified: Secondary | ICD-10-CM | POA: Diagnosis not present

## 2021-10-06 DIAGNOSIS — M84452D Pathological fracture, left femur, subsequent encounter for fracture with routine healing: Secondary | ICD-10-CM | POA: Diagnosis not present

## 2021-10-06 DIAGNOSIS — Z7401 Bed confinement status: Secondary | ICD-10-CM | POA: Diagnosis not present

## 2021-10-06 DIAGNOSIS — R2681 Unsteadiness on feet: Secondary | ICD-10-CM | POA: Diagnosis not present

## 2021-10-06 DIAGNOSIS — S72142D Displaced intertrochanteric fracture of left femur, subsequent encounter for closed fracture with routine healing: Secondary | ICD-10-CM | POA: Diagnosis not present

## 2021-10-06 DIAGNOSIS — R431 Parosmia: Secondary | ICD-10-CM | POA: Diagnosis not present

## 2021-10-06 DIAGNOSIS — M519 Unspecified thoracic, thoracolumbar and lumbosacral intervertebral disc disorder: Secondary | ICD-10-CM | POA: Diagnosis not present

## 2021-10-06 DIAGNOSIS — I10 Essential (primary) hypertension: Secondary | ICD-10-CM | POA: Diagnosis not present

## 2021-10-06 DIAGNOSIS — S7292XA Unspecified fracture of left femur, initial encounter for closed fracture: Secondary | ICD-10-CM | POA: Diagnosis not present

## 2021-10-06 DIAGNOSIS — M810 Age-related osteoporosis without current pathological fracture: Secondary | ICD-10-CM | POA: Diagnosis not present

## 2021-10-06 MED ORDER — RIVAROXABAN 15 MG PO TABS: 15.0000 mg | ORAL_TABLET | Freq: Every day | ORAL | Status: DC

## 2021-10-06 MED ORDER — ACETAMINOPHEN 500 MG PO TABS
500.0000 mg | ORAL_TABLET | Freq: Three times a day (TID) | ORAL | Status: DC
  Administered 2021-10-06: 500 mg via ORAL
  Filled 2021-10-06: qty 1

## 2021-10-06 MED ORDER — POLYETHYLENE GLYCOL 3350 17 G PO PACK: 17.0000 g | PACK | Freq: Every day | ORAL | 0 refills | Status: DC | PRN

## 2021-10-06 NOTE — TOC Progression Note (Signed)
Transition of Care [facility]) - Progression Note    Patient Details  Name: EMIE SOMMERFELD MRN: [id number] Date of Birth: 01/27/1931  Transition of Care [facility] CM/SW Contact  Lorri Frederick, LCSW Phone Number: 10/06/2021, 8:38 AM  Clinical Narrative:   Auth approved in [place]: [id number], 3 days 2/28-3/2.      Expected Discharge Plan: Home w Home Health Services    Expected Discharge Plan and Services Expected Discharge Plan: Home w Home Health Services   Discharge Planning Services: CM Consult Post Acute Care Choice: Durable Medical Equipment, Home Health Living arrangements for the past 2 months: Single Family Home                 DME Arranged: 3-N-1 DME Agency: [employer] Date DME Agency Contacted: 10/02/21 Time DME Agency Contacted: 1455 Representative spoke with at DME Agency: Leavy Cella HH Arranged: PT, OT HH Agency: [facility] Date [facility] Agency Contacted: 10/02/21 Time HH Agency Contacted: [phone] Representative spoke with at [facility] Agency: Kandee Keen   Social Determinants of Health (SDOH) Interventions    Readmission Risk Interventions No flowsheet data found.

## 2021-10-06 NOTE — Progress Notes (Signed)
Patient ID: Sandra Mclaughlin, female   DOB: Feb 13, 1931, 88 y.o.   MRN: [id number]   LOS: 6 days   Subjective: Progressing slowly but doing ok.   Objective: Vital signs in last 24 hours: Temp:  [98 F (36.7 C)-98.9 F (37.2 C)] 98.1 F (36.7 C) (02/28 0818) Pulse Rate:  [75-93] 75 (02/28 0818) Resp:  [15-20] 15 (02/28 0818) BP: (115-123)/(58-76) 119/76 (02/28 0818) SpO2:  [94 %-97 %] 97 % (02/28 0818) Last BM Date : 10/05/21   Laboratory  CBC Recent Labs    10/04/21 0111 10/05/21 1220  WBC 13.2* 13.4*  HGB 9.1* 10.5*  HCT 27.3* 31.9*  PLT 168 228   BMET Recent Labs    10/04/21 0111  NA 135  K 4.1  CL 104  CO2 25  GLUCOSE 85  BUN 13  CREATININE 0.61  CALCIUM 7.8*     Physical Exam General appearance: alert and no distress LLE: Incisions C/D/I, 2+ DP, EHL 5/5   Assessment/Plan: S/p IMN left hip -- Ok for d/c to SNF. F/u with Dr. Carola Frost in 2 weeks.    Freeman Caldron, PA-C Orthopedic Surgery [phone] 10/06/2021

## 2021-10-06 NOTE — Progress Notes (Signed)
Second attempted to give report to [facility] nurse took my number and said she will call me back

## 2021-10-06 NOTE — Plan of Care (Signed)

## 2021-10-06 NOTE — Discharge Summary (Signed)
Physician Discharge Summary   Patient: Sandra Mclaughlin MRN: [id number] DOB: [birth year]  Admit date:     09/30/2021  Discharge date: 10/06/21  Discharge Physician: Joseph Art   PCP: Clovis Riley, Elbert Ewings.August Saucer, MD   Recommendations at discharge:    Cbc 1 week Xarelto for 2 weeks Follow up with ortho 2 weeks  Discharge Diagnoses: Principal Problem:   Closed comminuted intertrochanteric fracture of left femur (HCC) Active Problems:   Primary hypertension   Glaucoma of both eyes   Osteoporosis with fracture   Peripheral neuropathy   Anemia   Leukocytosis   History of COVID-19  Resolved Problems:   * No resolved hospital problems. [facility] Course: Sandra Mclaughlin is a 88 y.o. female with medical history significant of hypertension, glaucoma, degenerative disc disease, chronic pain, neuropathy, diverticulosis, colitis presenting after a fall with hip pain.   Found to have a left femur fracture.  S/p OR on 2/23.  Initially progressive well with PT but stay have been complicated by hypotension (resolved), anemia (resolved) and "low grade" fever on scheduled tylenol (resolved).  Assessment and Plan: * Closed comminuted intertrochanteric fracture of left femur (HCC)- (present on admission) Fall- mechanical in her yard on an uneven surface.   > X-ray in the ED showed comminuted intertrochanteric fracture of the left femur. -orthopedic consult -s/p surgery on 2/23 - careful with opioids considering her blood pressure drop after initial administration and apparent opioid sensitive patient -PT/OT- SNF    History of COVID-19 Patient told Dr. Alinda Money that she had already tested positive for COVID twice this month.-- first positive: 2/1  This is likely a continuation of those prior positive test and not a new infection. - does not require precautions.  Leukocytosis > Unclear etiology, possibly reactive in the setting of fall and femur fracture. > Chest x-ray clear.  Urinalysis pending but no  symptoms. Low grade fever but on scheduled tylenol- ua not done, x ray w/o PNA, check blood cultures, doubt COVID as positive test was 2/1 - no fever- no cough, no dysuria -trending down  Anemia > Hemoglobin incidentally noted to be 9.8 in the ED down from previous baseline of 13 several months ago. > No clear evidence of bleeding, no hematoma from hip fracture - Type and screen already ordered in the ED- plan to give 1 unit PRBC prior to surgery 2/23 -another unit 2/24 -total of 2 units  Peripheral neuropathy - Continue home gabapentin  Osteoporosis with fracture- (present on admission) > Now with fracture as above - Continue calcium and vitamin D  Glaucoma of both eyes- (present on admission) - Continue home eyedrops  Primary hypertension- (present on admission) - Holding home amlodipine and lisinopril in the setting of transient hypotension     Consultants: ortho  Diet recommendation:  Discharge Diet Orders (From admission, onward)     Start     Ordered   10/06/21 0000  Diet general        10/06/21 0919             DISCHARGE MEDICATION: Allergies as of 10/06/2021       Reactions   Ciprofloxacin Diarrhea, Nausea And Vomiting, Other (See Comments)   "I had a terrible reaction, vomiting, nausea, diarrhea, muscle pain...."        Medication List     STOP taking these medications    amLODipine 10 MG tablet Commonly known as: NORVASC   ibuprofen 200 MG tablet Commonly known as: ADVIL  lisinopril 20 MG tablet Commonly known as: ZESTRIL   magic mouthwash (lidocaine, diphenhydrAMINE, alum & mag hydroxide) suspension   nystatin 100000 UNIT/ML suspension Commonly known as: MYCOSTATIN       TAKE these medications    acetaminophen 325 MG tablet Commonly known as: TYLENOL Take 325-650 mg by mouth every 6 (six) hours as needed for mild pain.   CALTRATE 600+D3 PO Take 1 tablet by mouth daily.   CENTRUM SILVER PO Take 1 tablet by mouth daily  with breakfast.   dorzolamide-timolol 22.3-6.8 MG/ML ophthalmic solution Commonly known as: COSOPT Place 1 drop into both eyes 2 (two) times daily.   gabapentin 300 MG capsule Commonly known as: NEURONTIN Take 1 capsule (300 mg total) by mouth 2 (two) times daily.   polyethylene glycol 17 g packet Commonly known as: MIRALAX / GLYCOLAX Take 17 g by mouth daily as needed for mild constipation.   Rivaroxaban 15 MG Tabs tablet Commonly known as: XARELTO Take 1 tablet (15 mg total) by mouth daily with supper.   vitamin B-12 500 MCG tablet Commonly known as: CYANOCOBALAMIN Take 500 mcg by mouth daily.   vitamin C 500 MG tablet Commonly known as: ASCORBIC ACID Take 500 mg by mouth daily.   Vitamin D-3 25 MCG (1000 UT) Caps Take 1,000 Units by mouth daily.               Discharge Care Instructions  (From admission, onward)           Start     Ordered   10/06/21 0000  Discharge wound care:       Comments: Re-inforce dressing   10/06/21 0919            Follow-up Information     Alroy Dust, L.Marlou Sa, MD Follow up.   Specialty: Family Medicine Contact information: [address] [place] [address] [phone]         Altamese Holly, MD Follow up in 2 week(s).   Specialty: Orthopedic Surgery Contact information: [place] [address] [phone]                 Discharge Exam: Danley Danker Weights   09/30/21 1555 10/01/21 1104  Weight: 49.9 kg 49.9 kg     Condition at discharge: good  The results of significant diagnostics from this hospitalization (including imaging, microbiology, ancillary and laboratory) are listed below for reference.   Imaging Studies: DG Chest 1 View  Result Date: 09/30/2021 CLINICAL DATA:  fall, pain EXAM: CHEST  1 VIEW.  Patient is rotated. COMPARISON:  None. FINDINGS: Prominent ascending aorta silhouette due to patient rotation. The heart and mediastinal contours are within normal  limits. Aortic calcification. No focal consolidation. No pulmonary edema. No pleural effusion. No pneumothorax. No acute osseous abnormality. Couple neural stimulator leads noted overlying the thoracic vertebral bodies. IMPRESSION: 1. No active disease. 2.  Aortic Atherosclerosis (ICD10-I70.0). Electronically Signed   By: Iven Finn M.D.   On: 09/30/2021 16:49   CT PELVIS W CONTRAST  Result Date: 09/30/2021 CLINICAL DATA:  Closed comminuted intertrochanteric fracture of left femur. Left hip fracture, declining hemoglobin, assess for hematoma/bleed. EXAM: CT PELVIS WITH CONTRAST TECHNIQUE: Multidetector CT imaging of the pelvis was performed using the standard protocol following the bolus administration of intravenous contrast. RADIATION DOSE REDUCTION: This exam was performed according to the departmental dose-optimization program which includes automated exposure control, adjustment of the mA and/or kV according to patient size and/or use of iterative reconstruction  technique. CONTRAST:  84mL OMNIPAQUE IOHEXOL 350 MG/ML SOLN COMPARISON:  Hip radiograph earlier today. FINDINGS: Urinary Tract: Distal ureters are decompressed. Mild bladder distension without wall thickening. Bowel: Prominent colonic diverticulosis. No diverticulitis or inflammation of pelvic bowel loops. Vascular/Lymphatic: Aorto bi-iliac atherosclerosis. There is intramuscular hemorrhage related to left intertrochanteric femur fracture, but no evidence of active bleed or focal hematoma. Reproductive:  Hysterectomy.  No adnexal mass. Other: Battery pack in the right gluteal soft tissues. There is no pelvic free fluid. Musculoskeletal: Comminuted and displaced intertrochanteric femur fracture. There is apex anterior angulation with displacement of fracture fragments. Mild proximal migration of the femoral shaft. There is hemorrhage in the adjacent musculature with edematous enlarged musculature, but no evidence of active bleeding or confluent  hematoma. There is mild soft tissue edema laterally. Chronic soft tissue calcifications are likely gluteal granulomas. IMPRESSION: 1. Comminuted and displaced intertrochanteric femur fracture with apex anterior angulation and displacement of fracture fragments. There is hemorrhage in the adjacent musculature with edematous enlarged musculature, but no evidence of active bleeding or confluent hematoma. 2. Colonic diverticulosis without diverticulitis. Aortic Atherosclerosis (ICD10-I70.0). Electronically Signed   By: Keith Rake M.D.   On: 09/30/2021 22:25   DG CHEST PORT 1 VIEW  Result Date: 10/04/2021 CLINICAL DATA:  Shortness of breath.  IM nail on 10/01/2021. EXAM: PORTABLE CHEST 1 VIEW COMPARISON:  09/30/2021 FINDINGS: Heart size is normal. Lungs are free of focal consolidations and pleural effusions. No pulmonary tube appears stable appearance of spinal stimulator. IMPRESSION: No active disease. Electronically Signed   By: Nolon Nations M.D.   On: 10/04/2021 11:09   DG C-Arm 1-60 Min-No Report  Result Date: 10/01/2021 CLINICAL DATA:  Left femur ORIF EXAM: LEFT FEMUR 2 VIEWS; DG C-ARM 1-60 MIN-NO REPORT COMPARISON:  09/30/2021 FINDINGS: Nine C-arm fluoroscopic images were obtained intraoperatively and submitted for post operative interpretation. Images demonstrate placement of ORIF hardware traversing intertrochanteric left femur fracture. Near anatomic alignment seen. 89 seconds of fluoroscopy time was utilized. Please see the performing provider's procedural report for further detail. IMPRESSION: As above. Electronically Signed   By: Davina Poke D.O.   On: 10/01/2021 14:49   DG C-Arm 1-60 Min-No Report  Result Date: 10/01/2021 CLINICAL DATA:  Left femur ORIF EXAM: LEFT FEMUR 2 VIEWS; DG C-ARM 1-60 MIN-NO REPORT COMPARISON:  09/30/2021 FINDINGS: Nine C-arm fluoroscopic images were obtained intraoperatively and submitted for post operative interpretation. Images demonstrate placement of  ORIF hardware traversing intertrochanteric left femur fracture. Near anatomic alignment seen. 89 seconds of fluoroscopy time was utilized. Please see the performing provider's procedural report for further detail. IMPRESSION: As above. Electronically Signed   By: Davina Poke D.O.   On: 10/01/2021 14:49   ECHOCARDIOGRAM COMPLETE  Result Date: 10/03/2021    ECHOCARDIOGRAM REPORT   Patient Name:   Sandra Mclaughlin Date of Exam: 10/03/2021 Medical Rec #:  [id number]    Height:       61.5 in Accession #:    [id number]   Weight:       110.0 lb Date of Birth:  [birth year]    BSA:          1.474 m Patient Age:    89 years     BP:           97/86 mmHg Patient Gender: F            HR:           76 bpm. Exam Location:  Inpatient Procedure: 2D Echo,  Cardiac Doppler, Color Doppler and Strain Analysis                             MODIFIED REPORT: This report was modified by Kirk Ruths MD on 10/03/2021 due to Change.  Indications:     Murmur  History:         Patient has no prior history of Echocardiogram examinations.  Sonographer:     Luisa Hart RDCS Referring Phys:  [id number] Geradine Girt Diagnosing Phys: Kirk Ruths MD IMPRESSIONS  1. Left ventricular ejection fraction, by estimation, is 70 to 75%. The left ventricle has hyperdynamic function. The left ventricle has no regional wall motion abnormalities. There is mild left ventricular hypertrophy of the basal-septal segment. Left ventricular diastolic parameters are consistent with Grade I diastolic dysfunction (impaired relaxation). The average left ventricular global longitudinal strain is -18.1 %. The global longitudinal strain is normal.  2. Right ventricular systolic function is normal. The right ventricular size is normal. There is mildly elevated pulmonary artery systolic pressure.  3. The mitral valve is normal in structure. Mild mitral valve regurgitation. No evidence of mitral stenosis.  4. Tricuspid valve regurgitation is mild to moderate.  5. The aortic valve  is tricuspid. Aortic valve regurgitation is not visualized. Mild aortic valve stenosis.  6. The inferior vena cava is normal in size with greater than 50% respiratory variability, suggesting right atrial pressure of 3 mmHg. FINDINGS  Left Ventricle: Left ventricular ejection fraction, by estimation, is 70 to 75%. The left ventricle has hyperdynamic function. The left ventricle has no regional wall motion abnormalities. The average left ventricular global longitudinal strain is -18.1  %. The global longitudinal strain is normal. The left ventricular internal cavity size was normal in size. There is mild left ventricular hypertrophy of the basal-septal segment. Left ventricular diastolic parameters are consistent with Grade I diastolic dysfunction (impaired relaxation). Right Ventricle: The right ventricular size is normal. Right ventricular systolic function is normal. There is mildly elevated pulmonary artery systolic pressure. The tricuspid regurgitant velocity is 3.18 m/s, and with an assumed right atrial pressure of 3 mmHg, the estimated right ventricular systolic pressure is [id number] mmHg. Left Atrium: Left atrial size was normal in size. Right Atrium: Right atrial size was normal in size. Pericardium: There is no evidence of pericardial effusion. Mitral Valve: The mitral valve is normal in structure. Mild mitral valve regurgitation. No evidence of mitral valve stenosis. MV peak gradient, 5.2 mmHg. The mean mitral valve gradient is 2.0 mmHg. Tricuspid Valve: The tricuspid valve is normal in structure. Tricuspid valve regurgitation is mild to moderate. No evidence of tricuspid stenosis. Aortic Valve: The aortic valve is tricuspid. Aortic valve regurgitation is not visualized. Mild aortic stenosis is present. Aortic valve mean gradient measures 9.5 mmHg. Aortic valve peak gradient measures 17.4 mmHg. Aortic valve area, by VTI measures 1.61 cm. Pulmonic Valve: The pulmonic valve was normal in structure. Pulmonic valve  regurgitation is not visualized. No evidence of pulmonic stenosis. Aorta: The aortic root is normal in size and structure. Venous: The inferior vena cava is normal in size with greater than 50% respiratory variability, suggesting right atrial pressure of 3 mmHg. IAS/Shunts: No atrial level shunt detected by color flow Doppler.  LEFT VENTRICLE PLAX 2D LVIDd:         3.90 cm     Diastology LVIDs:         2.20 cm     LV e'  medial:    7.40 cm/s LV PW:         1.00 cm     LV E/e' medial:  11.4 LV IVS:        1.50 cm     LV e' lateral:   8.92 cm/s LVOT diam:     2.00 cm     LV E/e' lateral: 9.5 LV SV:         66 LV SV Index:   45          2D Longitudinal Strain LVOT Area:     3.14 cm    2D Strain GLS Avg:     -18.1 %  LV Volumes (MOD) LV vol d, MOD A2C: 39.5 ml LV vol d, MOD A4C: 61.8 ml LV vol s, MOD A2C: 11.1 ml LV vol s, MOD A4C: 19.6 ml LV SV MOD A2C:     28.4 ml LV SV MOD A4C:     61.8 ml LV SV MOD BP:      38.1 ml RIGHT VENTRICLE RV Basal diam:  2.90 cm RV Mid diam:    2.70 cm RV S prime:     15.70 cm/s TAPSE (M-mode): 2.2 cm LEFT ATRIUM             Index        RIGHT ATRIUM           Index LA diam:        2.90 cm 1.97 cm/m   RA Area:     15.60 cm LA Vol (A2C):   49.3 ml 33.45 ml/m  RA Volume:   38.10 ml  25.85 ml/m LA Vol (A4C):   37.0 ml 25.10 ml/m LA Biplane Vol: 42.6 ml 28.90 ml/m  AORTIC VALVE                     PULMONIC VALVE AV Area (Vmax):    1.66 cm      PV Vmax:       1.00 m/s AV Area (Vmean):   1.57 cm      PV Vmean:      64.250 cm/s AV Area (VTI):     1.61 cm      PV VTI:        0.172 m AV Vmax:           208.50 cm/s   PV Peak grad:  4.0 mmHg AV Vmean:          144.000 cm/s  PV Mean grad:  2.0 mmHg AV VTI:            0.410 m AV Peak Grad:      17.4 mmHg AV Mean Grad:      9.5 mmHg LVOT Vmax:         110.00 cm/s LVOT Vmean:        71.800 cm/s LVOT VTI:          0.211 m LVOT/AV VTI ratio: 0.51  AORTA Ao Asc diam: 3.00 cm MITRAL VALVE               TRICUSPID VALVE MV Area (PHT): 4.21 cm    TR  Peak grad:   40.4 mmHg MV Area VTI:   2.58 cm    TR Vmax:        318.00 cm/s MV Peak grad:  5.2 mmHg MV Mean grad:  2.0 mmHg    SHUNTS MV Vmax:       1.14 m/s  Systemic VTI:  0.21 m MV Vmean:      57.2 cm/s   Systemic Diam: 2.00 cm MV Decel Time: 180 msec MR Peak grad: 94.5 mmHg MR Vmax:      486.00 cm/s MV E velocity: 84.40 cm/s MV A velocity: 97.00 cm/s MV E/A ratio:  0.87 Kirk Ruths MD Electronically signed by Kirk Ruths MD Signature Date/Time: 10/03/2021/1:33:55 PM    Final (Updated)    DG HIP UNILAT W OR W/O PELVIS 2-3 VIEWS LEFT  Result Date: 09/30/2021 CLINICAL DATA:  Fall, left hip pain EXAM: DG HIP (WITH OR WITHOUT PELVIS) 2-3V LEFT COMPARISON:  03/05/2021 FINDINGS: Acute comminuted intertrochanteric fracture of the proximal left femur with varus angulation. Mildly displaced lesser trochanteric fragment. No dislocation. Pelvic bony ring appears intact. IMPRESSION: Acute comminuted intertrochanteric fracture of the proximal left femur. Electronically Signed   By: Davina Poke D.O.   On: 09/30/2021 16:51   DG FEMUR MIN 2 VIEWS LEFT  Result Date: 10/01/2021 CLINICAL DATA:  Left femur ORIF EXAM: LEFT FEMUR 2 VIEWS; DG C-ARM 1-60 MIN-NO REPORT COMPARISON:  09/30/2021 FINDINGS: Nine C-arm fluoroscopic images were obtained intraoperatively and submitted for post operative interpretation. Images demonstrate placement of ORIF hardware traversing intertrochanteric left femur fracture. Near anatomic alignment seen. 89 seconds of fluoroscopy time was utilized. Please see the performing provider's procedural report for further detail. IMPRESSION: As above. Electronically Signed   By: Davina Poke D.O.   On: 10/01/2021 14:49   DG FEMUR PORT MIN 2 VIEWS LEFT  Result Date: 10/01/2021 CLINICAL DATA:  Postop left femur fracture EXAM: LEFT FEMUR PORTABLE 2 VIEWS COMPARISON:  None. FINDINGS: Postsurgical changes of left femur ORIF for an intertrochanteric fracture. Improved, near anatomic  alignment. Hardware is intact without evidence of loosening. Expected soft tissue changes. IMPRESSION: Improved, near anatomic alignment of the left intratrochanteric femur fracture after ORIF. No evidence of immediate hardware complication. Electronically Signed   By: Maurine Simmering M.D.   On: 10/01/2021 16:06    Microbiology: Results for orders placed or performed during the hospital encounter of 09/30/21  Resp Panel by RT-PCR (Flu A&B, Covid) Nasopharyngeal Swab     Status: Abnormal   Collection Time: 09/30/21  4:56 PM   Specimen: Nasopharyngeal Swab; Nasopharyngeal(NP) swabs in vial transport medium  Result Value Ref Range Status   SARS Coronavirus 2 by RT PCR POSITIVE (A) NEGATIVE Final    Comment: (NOTE) SARS-CoV-2 target nucleic acids are DETECTED.  The SARS-CoV-2 RNA is generally detectable in upper respiratory specimens during the acute phase of infection. Positive results are indicative of the presence of the identified virus, but do not rule out bacterial infection or co-infection with other pathogens not detected by the test. Clinical correlation with patient history and other diagnostic information is necessary to determine patient infection status. The expected result is Negative.  Fact Sheet for Patients: EntrepreneurPulse.com.au  Fact Sheet for Healthcare Providers: IncredibleEmployment.be  This test is not yet approved or cleared by the [place] FDA and  has been authorized for detection and/or diagnosis of SARS-CoV-2 by FDA under an Emergency Use Authorization (EUA).  This EUA will remain in effect (meaning this test can be used) for the duration of  the COVID-19 declaration under Section 564(b)(1) of the A ct, 21 U.S.C. section 360bbb-3(b)(1), unless the authorization is terminated or revoked sooner.     Influenza A by PCR NEGATIVE NEGATIVE Final   Influenza B by PCR NEGATIVE NEGATIVE Final    Comment: (NOTE) The Xpert  Xpress SARS-CoV-2/FLU/RSV plus assay is intended as an aid in the diagnosis of influenza from Nasopharyngeal swab specimens and should not be used as a sole basis for treatment. Nasal washings and aspirates are unacceptable for Xpert Xpress SARS-CoV-2/FLU/RSV testing.  Fact Sheet for Patients: EntrepreneurPulse.com.au  Fact Sheet for Healthcare Providers: IncredibleEmployment.be  This test is not yet approved or cleared by the [place] FDA and has been authorized for detection and/or diagnosis of SARS-CoV-2 by FDA under an Emergency Use Authorization (EUA). This EUA will remain in effect (meaning this test can be used) for the duration of the COVID-19 declaration under Section 564(b)(1) of the Act, 21 U.S.C. section 360bbb-3(b)(1), unless the authorization is terminated or revoked.  Performed at [facility], [place] [address] [place], [place] [address]   MRSA Next Gen by PCR, Nasal     Status: None   Collection Time: 10/01/21  1:24 AM   Specimen: Nasal Mucosa; Nasal Swab  Result Value Ref Range Status   MRSA by PCR Next Gen NOT DETECTED NOT DETECTED Final    Comment: (NOTE) The GeneXpert MRSA Assay (FDA approved for NASAL specimens only), is one component of a comprehensive MRSA colonization surveillance program. It is not intended to diagnose MRSA infection nor to guide or monitor treatment for MRSA infections. Test performance is not FDA approved in patients less than 44 years old. Performed at [facility], [place] [address], [place], [place] [address]   Surgical PCR Screen     Status: None   Collection Time: 10/01/21 11:20 AM   Specimen: Nasal Mucosa; Nasal Swab  Result Value Ref Range Status   MRSA, PCR NEGATIVE NEGATIVE Final   Staphylococcus aureus NEGATIVE NEGATIVE Final    Comment: (NOTE) The Xpert SA Assay (FDA approved for NASAL specimens in patients 76 years of age and older), is one component of a  comprehensive surveillance program. It is not intended to diagnose infection nor to guide or monitor treatment. Performed at [facility], [place] [address] [place], [place] [address]   Culture, blood (routine x 2)     Status: None (Preliminary result)   Collection Time: 10/04/21  2:20 PM   Specimen: BLOOD LEFT HAND  Result Value Ref Range Status   Specimen Description BLOOD LEFT HAND  Final   Special Requests   Final    BOTTLES DRAWN AEROBIC AND ANAEROBIC Blood Culture adequate volume   Culture   Final    NO GROWTH 2 DAYS Performed at [facility], [place] [address], [place], [place] [address]    Report Status PENDING  Incomplete  Culture, blood (routine x 2)     Status: None (Preliminary result)   Collection Time: 10/04/21  2:33 PM   Specimen: BLOOD RIGHT HAND  Result Value Ref Range Status   Specimen Description BLOOD RIGHT HAND  Final   Special Requests   Final    BOTTLES DRAWN AEROBIC ONLY Blood Culture adequate volume   Culture   Final    NO GROWTH 2 DAYS Performed at [facility], [place] [address], [place], [place] [address]    Report Status PENDING  Incomplete    Labs: CBC: Recent Labs  Lab 10/01/21 0217 10/02/21 0430 10/02/21 1842 10/03/21 0151 10/04/21 0111 10/05/21 1220  WBC 15.5* 13.7*  --  15.1* 13.2* 13.4*  HGB 8.0* 7.6* 9.8* 9.0* 9.1* 10.5*  HCT 25.2* 23.5* 29.5* 27.5* 27.3* 31.9*  MCV 93.0 91.1  --  91.1 89.5  89.9  PLT 149* 122*  --  129* 168 [id number]   Basic Metabolic Panel: Recent Labs  Lab 09/30/21 1656 10/01/21 0217 10/02/21 0430 10/03/21 0151 10/04/21 0111  NA 133* 134* 134* 133* 135  K 4.5 4.1 4.0 4.5 4.1  CL 99 104 104 103 104  CO2 27 26 25 24 25   GLUCOSE 121* 91 88 92 85  BUN 18 16 12 14 13   CREATININE 0.72 0.80 0.77 0.74 0.61  CALCIUM 8.4* 7.7* 7.9* 8.0* 7.8*   Liver Function Tests: Recent Labs  Lab 09/30/21 1656  AST 23  ALT 13  ALKPHOS 45  BILITOT 1.2  PROT 5.8*  ALBUMIN 2.8*   CBG: No results for input(s):  GLUCAP in the last 168 hours.  Discharge time spent: greater than 30 minutes.  Signed: Geradine Girt, DO [facility] 10/06/2021

## 2021-10-06 NOTE — TOC Transition Note (Signed)
Transition of Care [facility]) - CM/SW Discharge Note   Patient Details  Name: Sandra Mclaughlin MRN: [id number] Date of Birth: Nov 10, 1930  Transition of Care [facility]) CM/SW Contact:  Lorri Frederick, LCSW Phone Number: 10/06/2021, 12:08 PM   Clinical Narrative:   Pt discharging to [facility], room 3239.  RN call report to [phone].     Final next level of care: Skilled Nursing Facility     Patient Goals and CMS Choice        Discharge Placement              Patient chooses bed at: [facility] Patient to be transferred to facility by: ptar Name of family member notified: husband in room, LM with daughter Okey Regal Patient and family notified of of transfer: 10/06/21  Discharge Plan and Services   Discharge Planning Services: CM Consult Post Acute Care Choice: Durable Medical Equipment, Home Health          DME Arranged: 3-N-1 DME Agency: [employer] Date DME Agency Contacted: 10/02/21 Time DME Agency Contacted: 1455 Representative spoke with at DME Agency: Leavy Cella HH Arranged: PT, OT HH Agency: [facility] Date [facility] Agency Contacted: 10/02/21 Time HH Agency Contacted: 1453 Representative spoke with at [facility] Agency: Kandee Keen  Social Determinants of Health (SDOH) Interventions     Readmission Risk Interventions No flowsheet data found.

## 2021-10-07 DIAGNOSIS — I10 Essential (primary) hypertension: Secondary | ICD-10-CM | POA: Diagnosis not present

## 2021-10-07 DIAGNOSIS — D649 Anemia, unspecified: Secondary | ICD-10-CM | POA: Diagnosis not present

## 2021-10-07 DIAGNOSIS — S7292XA Unspecified fracture of left femur, initial encounter for closed fracture: Secondary | ICD-10-CM | POA: Diagnosis not present

## 2021-10-07 DIAGNOSIS — M810 Age-related osteoporosis without current pathological fracture: Secondary | ICD-10-CM | POA: Diagnosis not present

## 2021-10-08 DIAGNOSIS — G894 Chronic pain syndrome: Secondary | ICD-10-CM | POA: Diagnosis not present

## 2021-10-08 DIAGNOSIS — G629 Polyneuropathy, unspecified: Secondary | ICD-10-CM | POA: Diagnosis not present

## 2021-10-08 DIAGNOSIS — A09 Infectious gastroenteritis and colitis, unspecified: Secondary | ICD-10-CM | POA: Diagnosis not present

## 2021-10-08 DIAGNOSIS — D649 Anemia, unspecified: Secondary | ICD-10-CM | POA: Diagnosis not present

## 2021-10-09 DIAGNOSIS — D649 Anemia, unspecified: Secondary | ICD-10-CM | POA: Diagnosis not present

## 2021-10-09 DIAGNOSIS — G894 Chronic pain syndrome: Secondary | ICD-10-CM | POA: Diagnosis not present

## 2021-10-09 DIAGNOSIS — M84452D Pathological fracture, left femur, subsequent encounter for fracture with routine healing: Secondary | ICD-10-CM | POA: Diagnosis not present

## 2021-10-09 DIAGNOSIS — I10 Essential (primary) hypertension: Secondary | ICD-10-CM | POA: Diagnosis not present

## 2021-10-09 LAB — CULTURE, BLOOD (ROUTINE X 2)
Culture: NO GROWTH
Culture: NO GROWTH
Special Requests: ADEQUATE
Special Requests: ADEQUATE

## 2021-10-16 DIAGNOSIS — A09 Infectious gastroenteritis and colitis, unspecified: Secondary | ICD-10-CM | POA: Diagnosis not present

## 2021-10-16 DIAGNOSIS — G894 Chronic pain syndrome: Secondary | ICD-10-CM | POA: Diagnosis not present

## 2021-10-16 DIAGNOSIS — I10 Essential (primary) hypertension: Secondary | ICD-10-CM | POA: Diagnosis not present

## 2021-10-16 DIAGNOSIS — M84452D Pathological fracture, left femur, subsequent encounter for fracture with routine healing: Secondary | ICD-10-CM | POA: Diagnosis not present

## 2021-10-20 DIAGNOSIS — M519 Unspecified thoracic, thoracolumbar and lumbosacral intervertebral disc disorder: Secondary | ICD-10-CM | POA: Diagnosis not present

## 2021-10-20 DIAGNOSIS — G894 Chronic pain syndrome: Secondary | ICD-10-CM | POA: Diagnosis not present

## 2021-10-20 DIAGNOSIS — M84452D Pathological fracture, left femur, subsequent encounter for fracture with routine healing: Secondary | ICD-10-CM | POA: Diagnosis not present

## 2021-10-20 DIAGNOSIS — I10 Essential (primary) hypertension: Secondary | ICD-10-CM | POA: Diagnosis not present

## 2021-10-21 DIAGNOSIS — I10 Essential (primary) hypertension: Secondary | ICD-10-CM | POA: Diagnosis not present

## 2021-10-21 DIAGNOSIS — S72142D Displaced intertrochanteric fracture of left femur, subsequent encounter for closed fracture with routine healing: Secondary | ICD-10-CM | POA: Diagnosis not present

## 2021-10-21 DIAGNOSIS — N390 Urinary tract infection, site not specified: Secondary | ICD-10-CM | POA: Diagnosis not present

## 2021-10-21 DIAGNOSIS — M84452D Pathological fracture, left femur, subsequent encounter for fracture with routine healing: Secondary | ICD-10-CM | POA: Diagnosis not present

## 2021-10-21 DIAGNOSIS — M519 Unspecified thoracic, thoracolumbar and lumbosacral intervertebral disc disorder: Secondary | ICD-10-CM | POA: Diagnosis not present

## 2021-10-23 DIAGNOSIS — M519 Unspecified thoracic, thoracolumbar and lumbosacral intervertebral disc disorder: Secondary | ICD-10-CM | POA: Diagnosis not present

## 2021-10-23 DIAGNOSIS — I10 Essential (primary) hypertension: Secondary | ICD-10-CM | POA: Diagnosis not present

## 2021-10-23 DIAGNOSIS — H409 Unspecified glaucoma: Secondary | ICD-10-CM | POA: Diagnosis not present

## 2021-10-23 DIAGNOSIS — L89623 Pressure ulcer of left heel, stage 3: Secondary | ICD-10-CM | POA: Diagnosis not present

## 2021-10-23 DIAGNOSIS — G629 Polyneuropathy, unspecified: Secondary | ICD-10-CM | POA: Diagnosis not present

## 2021-10-23 DIAGNOSIS — Z9181 History of falling: Secondary | ICD-10-CM | POA: Diagnosis not present

## 2021-10-23 DIAGNOSIS — Z7901 Long term (current) use of anticoagulants: Secondary | ICD-10-CM | POA: Diagnosis not present

## 2021-10-23 DIAGNOSIS — M80052D Age-related osteoporosis with current pathological fracture, left femur, subsequent encounter for fracture with routine healing: Secondary | ICD-10-CM | POA: Diagnosis not present

## 2021-10-23 DIAGNOSIS — W19XXXD Unspecified fall, subsequent encounter: Secondary | ICD-10-CM | POA: Diagnosis not present

## 2021-10-23 DIAGNOSIS — G8929 Other chronic pain: Secondary | ICD-10-CM | POA: Diagnosis not present

## 2021-10-23 DIAGNOSIS — Z79899 Other long term (current) drug therapy: Secondary | ICD-10-CM | POA: Diagnosis not present

## 2021-10-28 DIAGNOSIS — K5792 Diverticulitis of intestine, part unspecified, without perforation or abscess without bleeding: Secondary | ICD-10-CM | POA: Diagnosis not present

## 2021-10-28 DIAGNOSIS — R109 Unspecified abdominal pain: Secondary | ICD-10-CM | POA: Diagnosis not present

## 2021-10-28 DIAGNOSIS — S72002D Fracture of unspecified part of neck of left femur, subsequent encounter for closed fracture with routine healing: Secondary | ICD-10-CM | POA: Diagnosis not present

## 2021-11-18 DIAGNOSIS — S72142D Displaced intertrochanteric fracture of left femur, subsequent encounter for closed fracture with routine healing: Secondary | ICD-10-CM | POA: Diagnosis not present

## 2021-11-22 DIAGNOSIS — W19XXXD Unspecified fall, subsequent encounter: Secondary | ICD-10-CM | POA: Diagnosis not present

## 2021-11-22 DIAGNOSIS — H409 Unspecified glaucoma: Secondary | ICD-10-CM | POA: Diagnosis not present

## 2021-11-22 DIAGNOSIS — M80052D Age-related osteoporosis with current pathological fracture, left femur, subsequent encounter for fracture with routine healing: Secondary | ICD-10-CM | POA: Diagnosis not present

## 2021-11-22 DIAGNOSIS — I10 Essential (primary) hypertension: Secondary | ICD-10-CM | POA: Diagnosis not present

## 2021-11-22 DIAGNOSIS — L89623 Pressure ulcer of left heel, stage 3: Secondary | ICD-10-CM | POA: Diagnosis not present

## 2021-11-22 DIAGNOSIS — Z9181 History of falling: Secondary | ICD-10-CM | POA: Diagnosis not present

## 2021-11-22 DIAGNOSIS — Z79899 Other long term (current) drug therapy: Secondary | ICD-10-CM | POA: Diagnosis not present

## 2021-11-22 DIAGNOSIS — Z7901 Long term (current) use of anticoagulants: Secondary | ICD-10-CM | POA: Diagnosis not present

## 2021-11-22 DIAGNOSIS — G629 Polyneuropathy, unspecified: Secondary | ICD-10-CM | POA: Diagnosis not present

## 2021-11-22 DIAGNOSIS — M519 Unspecified thoracic, thoracolumbar and lumbosacral intervertebral disc disorder: Secondary | ICD-10-CM | POA: Diagnosis not present

## 2021-11-22 DIAGNOSIS — G8929 Other chronic pain: Secondary | ICD-10-CM | POA: Diagnosis not present

## 2021-11-26 DIAGNOSIS — M25562 Pain in left knee: Secondary | ICD-10-CM | POA: Diagnosis not present

## 2021-12-01 DIAGNOSIS — K5792 Diverticulitis of intestine, part unspecified, without perforation or abscess without bleeding: Secondary | ICD-10-CM | POA: Diagnosis not present

## 2021-12-01 DIAGNOSIS — A09 Infectious gastroenteritis and colitis, unspecified: Secondary | ICD-10-CM | POA: Diagnosis not present

## 2021-12-01 DIAGNOSIS — M8080XA Other osteoporosis with current pathological fracture, unspecified site, initial encounter for fracture: Secondary | ICD-10-CM | POA: Diagnosis not present

## 2021-12-01 DIAGNOSIS — G629 Polyneuropathy, unspecified: Secondary | ICD-10-CM | POA: Diagnosis not present

## 2021-12-10 DIAGNOSIS — H02055 Trichiasis without entropian left lower eyelid: Secondary | ICD-10-CM | POA: Diagnosis not present

## 2021-12-10 DIAGNOSIS — H401131 Primary open-angle glaucoma, bilateral, mild stage: Secondary | ICD-10-CM | POA: Diagnosis not present

## 2021-12-16 DIAGNOSIS — S72142D Displaced intertrochanteric fracture of left femur, subsequent encounter for closed fracture with routine healing: Secondary | ICD-10-CM | POA: Diagnosis not present

## 2021-12-21 ENCOUNTER — Encounter: Payer: Self-pay | Admitting: Physical Therapy

## 2021-12-21 ENCOUNTER — Ambulatory Visit: Payer: Medicare Other | Attending: Orthopedic Surgery | Admitting: Physical Therapy

## 2021-12-21 ENCOUNTER — Other Ambulatory Visit: Payer: Self-pay

## 2021-12-21 DIAGNOSIS — M6281 Muscle weakness (generalized): Secondary | ICD-10-CM | POA: Insufficient documentation

## 2021-12-21 DIAGNOSIS — M25552 Pain in left hip: Secondary | ICD-10-CM | POA: Insufficient documentation

## 2021-12-21 DIAGNOSIS — R2689 Other abnormalities of gait and mobility: Secondary | ICD-10-CM | POA: Insufficient documentation

## 2021-12-21 NOTE — Patient Instructions (Signed)
Access Code: [email or web address] ?URL: [email or web address] ?Date: 12/21/2021 ?Prepared by: Rosana Hoes ? ?Exercises ?- Clamshell  - 1 x daily - 2 sets - 10 reps ?- Active Straight Leg Raise with Quad Set  - 1 x daily - 2 sets - 10 reps ?- Bridge  - 1 x daily - 2 sets - 10 reps ?- Sit to Stand  - 1 x daily - 3 sets - 5 reps ?- Standing Hip Abduction with Counter Support  - 1 x daily - 2 sets - 10 reps ?- Standing March with Counter Support  - 1 x daily - 2 sets - 20 reps ?- Heel Raises with Counter Support  - 1 x daily - 2 sets - 15 reps ?

## 2021-12-21 NOTE — Therapy (Signed)
?OUTPATIENT PHYSICAL THERAPY EVALUATION ? ? ?Patient Name: Sandra Mclaughlin ?MRN: [id number] ?DOB:1931-07-03, 88 y.o., female ?Today's Date: 12/21/2021 ? ? PT End of Session - 12/21/21 1527   ? ? Visit Number 1   ? Number of Visits 8   ? Date for PT Re-Evaluation 02/15/22   ? Authorization Type BCBS Medicare   ? Progress Note Due on Visit 10   ? PT Start Time 1530   ? PT Stop Time 1615   ? PT Time Calculation (min) 45 min   ? Activity Tolerance Patient tolerated treatment well   ? Behavior During Therapy [facility] for tasks assessed/performed   ? ?  ?  ? ?  ? ? ?Past Medical History:  ?Diagnosis Date  ? History of bone density study 2022  ? History of colonoscopy 2015  ? History of CT scan 2022  ? History of mammogram 2022  ? ?Past Surgical History:  ?Procedure Laterality Date  ? ABDOMINAL HYSTERECTOMY  1974  ? APPENDECTOMY    ? CHOLECYSTECTOMY  1995  ? HERNIA REPAIR    ? INTRAMEDULLARY (IM) NAIL INTERTROCHANTERIC Left 10/01/2021  ? Procedure: INTRAMEDULLARY (IM) NAIL INTERTROCHANTRIC;  Surgeon: Myrene Galas, MD;  Location: [facility] OR;  Service: Orthopedics;  Laterality: Left;  ? SPINAL CORD STIMULATOR IMPLANT  2017  ? VAGINAL PROLAPSE REPAIR  1992  ? ?Patient Active Problem List  ? Diagnosis Date Noted  ? History of COVID-19 10/03/2021  ? Osteoporosis with fracture 09/30/2021  ? Diverticulitis 09/30/2021  ? Infectious colitis, enteritis and gastroenteritis 09/30/2021  ? Peripheral neuropathy 09/30/2021  ? Closed comminuted intertrochanteric fracture of left femur (HCC) 09/30/2021  ? Fall at home, initial encounter 09/30/2021  ? Anemia 09/30/2021  ? Leukocytosis 09/30/2021  ? Epigastric pain 04/16/2021  ? Nausea 04/16/2021  ? Primary hypertension 04/16/2021  ? Other constipation 04/16/2021  ? Chronic pain of left knee 04/16/2021  ? Chronic right shoulder pain 04/16/2021  ? Degenerative disc disease, thoracic 04/16/2021  ? Glaucoma of both eyes 04/16/2021  ? Aortic atherosclerosis (HCC) 04/16/2021  ? Neuropathic pain 05/11/2016   ? ? ?PCP: Clovis Riley, L.August Saucer, MD ? ?REFERRING PROVIDER: Montez Morita, PA-C ? ?REFERRING DIAG: Left hip fracture (IMN left hip) ? ?THERAPY DIAG:  ?Pain in left hip ? ?Muscle weakness (generalized) ? ?ONSET DATE: 10/01/2021 DOS ? ?SUBJECTIVE:  ?SUBJECTIVE STATEMENT: ?Patient reports she lost her balance and fell on her left hip in February, then had IMN for left hip. She reports she has been doing well since the surgery, she has had HHPT 1x/week for the past few months. She has been using the cane for a couple of weeks and she notices the left hip is weaker. She denies any left hip pain, she notes that it gets tired if she is walking or on it for longer periods. She has also noticed times where the hip will almost give way. She denies any hip pain. ? ?PERTINENT HISTORY: ?Left hip IMN  ? ?PAIN:  ?Are you having pain? No: NPRS scale: 0/10 ? ?PRECAUTIONS: Fall ? ?WEIGHT BEARING RESTRICTIONS No ? ?FALLS:  ?Has patient fallen in last 6 months? Yes. Number of falls 1 ? ?LIVING ENVIRONMENT: ?Lives with: lives with their spouse ?Lives in: House/apartment ?Stairs: Yes: External: 1 steps; none ?Has following equipment at home: Single point cane ? ?OCCUPATION: Retired ? ?PLOF: Independent ? ?PATIENT GOALS: Be confident in walking better ? ? ?OBJECTIVE:  ?PATIENT SURVEYS:  ?FOTO 44% functional status ? ?COGNITION: ?Overall cognitive status: Within functional limits for  tasks assessed   ?  ?SENSATION: ?WFL ? ?MUSCLE LENGTH: ?Hamstring and quad flexibility limitation ? ?POSTURE:  ?Rounded shoulder posture ? ?PALPATION: ?Non-tender ? ?LE ROM: ?  Hip PROM grossly WFL ? ?LE MMT: ? ?MMT Right ?12/21/2021 Left ?12/21/2021  ?Hip flexion 4 4-  ?Hip extension 3 3-  ?Hip abduction 4- 3-  ?Knee flexion 5 4  ?Knee extension 5 4  ?Ankle dorsiflexion 5 5  ?Ankle plantarflexion 4 4  ? ?FUNCTIONAL TESTS:  ?5 times sit to stand: 43 seconds ?Timed up and go (TUG): 27 seconds using SPC ?6 minute walk test: 462 ft using SPC ? ?GAIT: ?Distance walked: 462  ft ?Assistive device utilized: Single point cane (right) ?Level of assistance: Modified independence ?Comments: patient demonstrates decreased gait speed, decreased stance time on left with decreased step length on right,  ? ? ?TODAY'S TREATMENT: ?Clamshell x 10 ?SLR x 10 each ?Bridge x 10 ?Standing hip abduction x 10 each ?Standing alternating march x 20 ?Standing heel raises x 15 ? ? ?PATIENT EDUCATION:  ?Education details: Exam findings, POC, HEP ?Person educated: Patient ?Education method: Explanation, Demonstration, Tactile cues, Verbal cues, and Handouts ?Education comprehension: verbalized understanding, returned demonstration, verbal cues required, tactile cues required, and needs further education ? ?HOME EXERCISE PROGRAM: ?Access Code: [email or web address] ? ? ?ASSESSMENT: ?CLINICAL IMPRESSION: ?Patient is a 88 y.o. female who was seen today for physical therapy evaluation and treatment for left hip weakness and walking difficulty due to left hip IMN on 10/01/2021. She demonstrates gross weakness of the LLE with limitations in mobility, balance, and walking with an increase in fall risk. ? ? ?OBJECTIVE IMPAIRMENTS Abnormal gait, decreased activity tolerance, decreased balance, decreased mobility, difficulty walking, and decreased strength.  ? ?ACTIVITY LIMITATIONS cleaning, community activity, meal prep, laundry, and shopping.  ? ?PERSONAL FACTORS Age, Fitness, and Time since onset of injury/illness/exacerbation are also affecting patient's functional outcome.  ? ? ?REHAB POTENTIAL: Good ? ?CLINICAL DECISION MAKING: Stable/uncomplicated ? ?EVALUATION COMPLEXITY: Low ? ? ?GOALS: ?Goals reviewed with patient? Yes ? ?SHORT TERM GOALS: Target date: 01/18/2022  ? ?Patient will be I with initial HEP in order to progress with therapy. ?Baseline: HEP provided at eval ?Goal status: INITIAL ? ?2.  PT will review FOTO with patient by 3rd visit in order to understand expected progress and outcome with therapy. ?Baseline:  ?Goal  status: INITIAL ? ?LONG TERM GOALS: Target date: 02/15/2022 ? ?Patient will be I with final HEP to maintain progress from PT. ?Baseline: HEP provided at eval ?Goal status: INITIAL ? ?2.  Patient will report >/= 64% status on FOTO to indicate improved functional ability. ?Baseline: 44% ?Goal status: INITIAL ? ?3.  Patient will perform 5xSTS </= 14 seconds to indicate reduced fall risk and improved strength. ?Baseline: 43 seconds ?Goal status: INITIAL ? ?4.  Patient will perform TUG in </= 9 seconds with LRAD to indicate improved mobility and reduced fall risk. ?Baseline: 27 seconds ?Goal status: INITIAL ? ?5.  Patient will perform 6MWT >/= 1100 ft using LRAD to improve walking ability and community access. ?Baseline: 462 ft ?Goal status: INITIAL ? ? ?PLAN: ?PT FREQUENCY: 1-2x/week ? ?PT DURATION: 8 weeks ? ?PLANNED INTERVENTIONS: Therapeutic exercises, Therapeutic activity, Neuromuscular re-education, Balance training, Gait training, Patient/Family education, Joint manipulation, Joint mobilization, Aquatic Therapy, Dry Needling, Cryotherapy, Moist heat, Manual therapy, and Re-evaluation ? ?PLAN FOR NEXT SESSION: Review HEP and progress PRN, focus on left hip strengthening, balance training, progress gait training with no AD as able ? ? ?Hilda Blades,  PT, DPT, LAT, ATC ?12/21/21  4:58 PM ?Phone: [phone] ?Fax: [phone] ? ? ?

## 2021-12-31 DIAGNOSIS — G629 Polyneuropathy, unspecified: Secondary | ICD-10-CM | POA: Diagnosis not present

## 2021-12-31 DIAGNOSIS — M8080XA Other osteoporosis with current pathological fracture, unspecified site, initial encounter for fracture: Secondary | ICD-10-CM | POA: Diagnosis not present

## 2021-12-31 DIAGNOSIS — K5792 Diverticulitis of intestine, part unspecified, without perforation or abscess without bleeding: Secondary | ICD-10-CM | POA: Diagnosis not present

## 2021-12-31 DIAGNOSIS — A09 Infectious gastroenteritis and colitis, unspecified: Secondary | ICD-10-CM | POA: Diagnosis not present

## 2022-01-06 ENCOUNTER — Ambulatory Visit: Payer: Medicare Other

## 2022-01-06 DIAGNOSIS — M6281 Muscle weakness (generalized): Secondary | ICD-10-CM

## 2022-01-06 DIAGNOSIS — R2689 Other abnormalities of gait and mobility: Secondary | ICD-10-CM

## 2022-01-06 DIAGNOSIS — M25552 Pain in left hip: Secondary | ICD-10-CM | POA: Diagnosis not present

## 2022-01-06 NOTE — Therapy (Signed)
OUTPATIENT PHYSICAL THERAPY TREATMENT NOTE   Patient Name: Sandra Mclaughlin MRN: [id number] DOB:1930/09/25, 88 y.o., female Today's Date: 01/06/2022  PCP: Clovis Riley, L.August Saucer, MD REFERRING PROVIDER: Montez Morita, PA-C  END OF SESSION:   PT End of Session - 01/06/22 1442     Visit Number 2    Number of Visits 8    Date for PT Re-Evaluation 02/15/22    Authorization Type BCBS Medicare    Progress Note Due on Visit 10    PT Start Time 1445    PT Stop Time 1530    PT Time Calculation (min) 45 min    Activity Tolerance Patient tolerated treatment well    Behavior During Therapy [facility] for tasks assessed/performed             Past Medical History:  Diagnosis Date   History of bone density study 2022   History of colonoscopy 2015   History of CT scan 2022   History of mammogram 2022   Past Surgical History:  Procedure Laterality Date   ABDOMINAL HYSTERECTOMY  1974   APPENDECTOMY     CHOLECYSTECTOMY  1995   HERNIA REPAIR     INTRAMEDULLARY (IM) NAIL INTERTROCHANTERIC Left 10/01/2021   Procedure: INTRAMEDULLARY (IM) NAIL INTERTROCHANTRIC;  Surgeon: Myrene Galas, MD;  Location: [facility] OR;  Service: Orthopedics;  Laterality: Left;   SPINAL CORD STIMULATOR IMPLANT  2017   VAGINAL PROLAPSE REPAIR  1992   Patient Active Problem List   Diagnosis Date Noted   History of COVID-19 10/03/2021   Osteoporosis with fracture 09/30/2021   Diverticulitis 09/30/2021   Infectious colitis, enteritis and gastroenteritis 09/30/2021   Peripheral neuropathy 09/30/2021   Closed comminuted intertrochanteric fracture of left femur (HCC) 09/30/2021   Fall at home, initial encounter 09/30/2021   Anemia 09/30/2021   Leukocytosis 09/30/2021   Epigastric pain 04/16/2021   Nausea 04/16/2021   Primary hypertension 04/16/2021   Other constipation 04/16/2021   Chronic pain of left knee 04/16/2021   Chronic right shoulder pain 04/16/2021   Degenerative disc disease, thoracic 04/16/2021   Glaucoma of both eyes  04/16/2021   Aortic atherosclerosis (HCC) 04/16/2021   Neuropathic pain 05/11/2016    REFERRING DIAG: Left hip fracture (IMN left hip)   THERAPY DIAG:  Pain in left hip  Muscle weakness (generalized)  Other abnormalities of gait and mobility  Rationale for Evaluation and Treatment Rehabilitation  PERTINENT HISTORY: Left hip IMN   PRECAUTIONS: Fall  ONSET DATE: 10/01/2021 DOS  SUBJECTIVE: Patient presents to PT with no current pain and reports HEP compliance.   PAIN:  Are you having pain? No NPRS scale: 0/10   OBJECTIVE: (objective measures completed at initial evaluation unless otherwise dated)   PATIENT SURVEYS:  FOTO 44% functional status   COGNITION: Overall cognitive status: Within functional limits for tasks assessed                          SENSATION: WFL   MUSCLE LENGTH: Hamstring and quad flexibility limitation   POSTURE:  Rounded shoulder posture   PALPATION: Non-tender   LE ROM:                       Hip PROM grossly WFL   LE MMT:   MMT Right 12/21/2021 Left 12/21/2021  Hip flexion 4 4-  Hip extension 3 3-  Hip abduction 4- 3-  Knee flexion 5 4  Knee extension 5 4  Ankle dorsiflexion  5 5  Ankle plantarflexion 4 4    FUNCTIONAL TESTS:  5 times sit to stand: 43 seconds Timed up and go (TUG): 27 seconds using SPC 6 minute walk test: 462 ft using SPC   GAIT: Distance walked: 462 ft Assistive device utilized: Single point cane (right) Level of assistance: Modified independence Comments: patient demonstrates decreased gait speed, decreased stance time on left with decreased step length on right,      TODAY'S TREATMENT: [facility] Adult PT Treatment:                                                DATE: 01/06/2022 Therapeutic Exercise: Nustep level 4 x 5 mins Supine clamshell GTB 2x10 SLR x10 BIL Standing hip abduction 2x10 BIL Standing alternating march 2x10 BIL Standing hip extension 2x10 BIL Standing heel/toe raises 2x10 Seated LAQ  2x10 BIL Seated hip adduction ball squeeze 5" hold 2x10 Seated LAQ with hip adduction ball squeeze 2x10 Seated hamstring curl GTB 2x10 BIL STS 2x10  12/21/2021: Clamshell x 10 SLR x 10 each Bridge x 10 Standing hip abduction x 10 each Standing alternating march x 20 Standing heel raises x 15     PATIENT EDUCATION:  Education details: Exam findings, POC, HEP Person educated: Patient Education method: Explanation, Demonstration, Tactile cues, Verbal cues, and Handouts Education comprehension: verbalized understanding, returned demonstration, verbal cues required, tactile cues required, and needs further education   HOME EXERCISE PROGRAM: Access Code: [email or web address]     ASSESSMENT: CLINICAL IMPRESSION: Patient presents to PT with no current pain in her hip and reports HEP compliance. Updated HEP to removed bridges as patient said this caused her lower back to hurt. Session today focused on proximal hip and LE strengthening. Plan to incorporate more machine exercises next session. Patient was able to tolerate all prescribed exercises with no adverse effects. Patient continues to benefit from skilled PT services and should be progressed as able to improve functional independence.    OBJECTIVE IMPAIRMENTS Abnormal gait, decreased activity tolerance, decreased balance, decreased mobility, difficulty walking, and decreased strength.    ACTIVITY LIMITATIONS cleaning, community activity, meal prep, laundry, and shopping.    PERSONAL FACTORS Age, Fitness, and Time since onset of injury/illness/exacerbation are also affecting patient's functional outcome.      REHAB POTENTIAL: Good   CLINICAL DECISION MAKING: Stable/uncomplicated   EVALUATION COMPLEXITY: Low     GOALS: Goals reviewed with patient? Yes   SHORT TERM GOALS: Target date: 01/18/2022    Patient will be I with initial HEP in order to progress with therapy. Baseline: HEP provided at eval Goal status: INITIAL   2.  PT will  review FOTO with patient by 3rd visit in order to understand expected progress and outcome with therapy. Baseline:  Goal status: INITIAL   LONG TERM GOALS: Target date: 02/15/2022   Patient will be I with final HEP to maintain progress from PT. Baseline: HEP provided at eval Goal status: INITIAL   2.  Patient will report >/= 64% status on FOTO to indicate improved functional ability. Baseline: 44% Goal status: INITIAL   3.  Patient will perform 5xSTS </= 14 seconds to indicate reduced fall risk and improved strength. Baseline: 43 seconds Goal status: INITIAL   4.  Patient will perform TUG in </= 9 seconds with LRAD to indicate improved mobility and reduced fall risk. Baseline: 27  seconds Goal status: INITIAL   5.  Patient will perform >/= 1100 ft using LRAD to improve walking ability and community access. Baseline: 462 ft Goal status: INITIAL     PLAN: PT FREQUENCY: 1-2x/week   PT DURATION: 8 weeks   PLANNED INTERVENTIONS: Therapeutic exercises, Therapeutic activity, Neuromuscular re-education, Balance training, Gait training, Patient/Family education, Joint manipulation, Joint mobilization, Aquatic Therapy, Dry Needling, Cryotherapy, Moist heat, Manual therapy, and Re-evaluation   PLAN FOR NEXT SESSION: Review HEP and progress PRN, focus on left hip strengthening, balance training, progress gait training with no AD as able    Harland German, PTA 01/06/2022, 2:42 PM

## 2022-01-12 DIAGNOSIS — R54 Age-related physical debility: Secondary | ICD-10-CM | POA: Diagnosis not present

## 2022-01-12 DIAGNOSIS — I10 Essential (primary) hypertension: Secondary | ICD-10-CM | POA: Diagnosis not present

## 2022-01-13 ENCOUNTER — Ambulatory Visit: Payer: Medicare Other

## 2022-01-13 NOTE — Therapy (Incomplete)
OUTPATIENT PHYSICAL THERAPY TREATMENT NOTE   Patient Name: Sandra Mclaughlin MRN: [id number] DOB:1931/03/03, 88 y.o., female Today's Date: 01/13/2022  PCP: Clovis Riley, L.August Saucer, MD REFERRING PROVIDER: Montez Morita, PA-C  END OF SESSION:     Past Medical History:  Diagnosis Date   History of bone density study 2022   History of colonoscopy 2015   History of CT scan 2022   History of mammogram 2022   Past Surgical History:  Procedure Laterality Date   ABDOMINAL HYSTERECTOMY  1974   APPENDECTOMY     CHOLECYSTECTOMY  1995   HERNIA REPAIR     INTRAMEDULLARY (IM) NAIL INTERTROCHANTERIC Left 10/01/2021   Procedure: INTRAMEDULLARY (IM) NAIL INTERTROCHANTRIC;  Surgeon: Myrene Galas, MD;  Location: [facility] OR;  Service: Orthopedics;  Laterality: Left;   SPINAL CORD STIMULATOR IMPLANT  2017   VAGINAL PROLAPSE REPAIR  1992   Patient Active Problem List   Diagnosis Date Noted   History of COVID-19 10/03/2021   Osteoporosis with fracture 09/30/2021   Diverticulitis 09/30/2021   Infectious colitis, enteritis and gastroenteritis 09/30/2021   Peripheral neuropathy 09/30/2021   Closed comminuted intertrochanteric fracture of left femur (HCC) 09/30/2021   Fall at home, initial encounter 09/30/2021   Anemia 09/30/2021   Leukocytosis 09/30/2021   Epigastric pain 04/16/2021   Nausea 04/16/2021   Primary hypertension 04/16/2021   Other constipation 04/16/2021   Chronic pain of left knee 04/16/2021   Chronic right shoulder pain 04/16/2021   Degenerative disc disease, thoracic 04/16/2021   Glaucoma of both eyes 04/16/2021   Aortic atherosclerosis (HCC) 04/16/2021   Neuropathic pain 05/11/2016    REFERRING DIAG: Left hip fracture (IMN left hip)   THERAPY DIAG:  No diagnosis found.  Rationale for Evaluation and Treatment Rehabilitation  PERTINENT HISTORY: Left hip IMN   PRECAUTIONS: Fall  ONSET DATE: 10/01/2021 DOS  SUBJECTIVE: *** Patient presents to PT with no current pain and reports HEP  compliance.   PAIN:  Are you having pain? No NPRS scale: ***/10   OBJECTIVE: (objective measures completed at initial evaluation unless otherwise dated)   PATIENT SURVEYS:  FOTO 44% functional status   COGNITION: Overall cognitive status: Within functional limits for tasks assessed                          SENSATION: WFL   MUSCLE LENGTH: Hamstring and quad flexibility limitation   POSTURE:  Rounded shoulder posture   PALPATION: Non-tender   LE ROM:                       Hip PROM grossly WFL   LE MMT:   MMT Right 12/21/2021 Left 12/21/2021  Hip flexion 4 4-  Hip extension 3 3-  Hip abduction 4- 3-  Knee flexion 5 4  Knee extension 5 4  Ankle dorsiflexion 5 5  Ankle plantarflexion 4 4    FUNCTIONAL TESTS:  5 times sit to stand: 43 seconds Timed up and go (TUG): 27 seconds using SPC 6 minute walk test: 462 ft using SPC   GAIT: Distance walked: 462 ft Assistive device utilized: Single point cane (right) Level of assistance: Modified independence Comments: patient demonstrates decreased gait speed, decreased stance time on left with decreased step length on right,      TODAY'S TREATMENT: [facility] Adult PT Treatment:  DATE: 01/13/2022 Therapeutic Exercise: Nustep level 4 x 5 mins Supine clamshell GTB 2x10 SLR x10 BIL Standing hip abduction 2x10 BIL Standing alternating march 2x10 BIL Standing hip extension 2x10 BIL Standing heel/toe raises 2x10 Omega knee extension 10# Omega knee flexion 25# Seated hip adduction ball squeeze 5" hold 2x10 Seated LAQ with hip adduction ball squeeze 2x10 STS 2x10 Manual Therapy: *** Neuromuscular re-ed: *** Therapeutic Activity: *** Modalities: *** Self Care: ***   [facility] Adult PT Treatment:                                                DATE: 01/06/2022 Therapeutic Exercise: Nustep level 4 x 5 mins Supine clamshell GTB 2x10 SLR x10 BIL Standing hip abduction 2x10  BIL Standing alternating march 2x10 BIL Standing hip extension 2x10 BIL Standing heel/toe raises 2x10 Seated LAQ 2x10 BIL Seated hip adduction ball squeeze 5" hold 2x10 Seated LAQ with hip adduction ball squeeze 2x10 Seated hamstring curl GTB 2x10 BIL STS 2x10  12/21/2021: Clamshell x 10 SLR x 10 each Bridge x 10 Standing hip abduction x 10 each Standing alternating march x 20 Standing heel raises x 15     PATIENT EDUCATION:  Education details: Exam findings, POC, HEP Person educated: Patient Education method: Explanation, Demonstration, Tactile cues, Verbal cues, and Handouts Education comprehension: verbalized understanding, returned demonstration, verbal cues required, tactile cues required, and needs further education   HOME EXERCISE PROGRAM: Access Code: [email or web address]     ASSESSMENT: CLINICAL IMPRESSION: ***  Patient presents to PT with no current pain in her hip and reports HEP compliance. Updated HEP to removed bridges as patient said this caused her lower back to hurt. Session today focused on proximal hip and LE strengthening. Plan to incorporate more machine exercises next session. Patient was able to tolerate all prescribed exercises with no adverse effects. Patient continues to benefit from skilled PT services and should be progressed as able to improve functional independence.    OBJECTIVE IMPAIRMENTS Abnormal gait, decreased activity tolerance, decreased balance, decreased mobility, difficulty walking, and decreased strength.    ACTIVITY LIMITATIONS cleaning, community activity, meal prep, laundry, and shopping.    PERSONAL FACTORS Age, Fitness, and Time since onset of injury/illness/exacerbation are also affecting patient's functional outcome.      REHAB POTENTIAL: Good   CLINICAL DECISION MAKING: Stable/uncomplicated   EVALUATION COMPLEXITY: Low     GOALS: Goals reviewed with patient? Yes   SHORT TERM GOALS: Target date: 01/18/2022    Patient will be I  with initial HEP in order to progress with therapy. Baseline: HEP provided at eval Goal status: INITIAL   2.  PT will review FOTO with patient by 3rd visit in order to understand expected progress and outcome with therapy. Baseline:  Goal status: INITIAL   LONG TERM GOALS: Target date: 02/15/2022   Patient will be I with final HEP to maintain progress from PT. Baseline: HEP provided at eval Goal status: INITIAL   2.  Patient will report >/= 64% status on FOTO to indicate improved functional ability. Baseline: 44% Goal status: INITIAL   3.  Patient will perform 5xSTS </= 14 seconds to indicate reduced fall risk and improved strength. Baseline: 43 seconds Goal status: INITIAL   4.  Patient will perform TUG in </= 9 seconds with LRAD to indicate improved mobility and reduced fall risk. Baseline: 27  seconds Goal status: INITIAL   5.  Patient will perform 6MWT >/= 1100 ft using LRAD to improve walking ability and community access. Baseline: 462 ft Goal status: INITIAL     PLAN: PT FREQUENCY: 1-2x/week   PT DURATION: 8 weeks   PLANNED INTERVENTIONS: Therapeutic exercises, Therapeutic activity, Neuromuscular re-education, Balance training, Gait training, Patient/Family education, Joint manipulation, Joint mobilization, Aquatic Therapy, Dry Needling, Cryotherapy, Moist heat, Manual therapy, and Re-evaluation   PLAN FOR NEXT SESSION: Review HEP and progress PRN, focus on left hip strengthening, balance training, progress gait training with no AD as able    Harland GermanStephanie Paulett Kaufhold, PTA 01/13/2022, 11:36 AM

## 2022-01-15 DIAGNOSIS — N390 Urinary tract infection, site not specified: Secondary | ICD-10-CM | POA: Diagnosis not present

## 2022-01-15 DIAGNOSIS — R350 Frequency of micturition: Secondary | ICD-10-CM | POA: Diagnosis not present

## 2022-01-19 NOTE — Therapy (Signed)
OUTPATIENT PHYSICAL THERAPY TREATMENT NOTE   Patient Name: Sandra Mclaughlin MRN: [id number] DOB:15-Dec-1930, 88 y.o., female Today's Date: 01/20/2022  PCP: Alroy Dust, L.Marlou Sa, MD REFERRING PROVIDER: Ainsley Spinner, PA-C  END OF SESSION:   PT End of Session - 01/20/22 1400     Visit Number 3    Number of Visits 8    Date for PT Re-Evaluation 02/15/22    Authorization Type BCBS Medicare    Progress Note Due on Visit 10    PT Start Time 1400    PT Stop Time 1440    PT Time Calculation (min) 40 min    Activity Tolerance Patient tolerated treatment well    Behavior During Therapy [facility] for tasks assessed/performed              Past Medical History:  Diagnosis Date   History of bone density study 2022   History of colonoscopy 2015   History of CT scan 2022   History of mammogram 2022   Past Surgical History:  Procedure Laterality Date   ABDOMINAL HYSTERECTOMY  [place] (IM) NAIL INTERTROCHANTERIC Left 10/01/2021   Procedure: INTRAMEDULLARY (IM) NAIL INTERTROCHANTRIC;  Surgeon: Altamese Rockwood, MD;  Location: [place];  Service: Orthopedics;  Laterality: Left;   SPINAL CORD STIMULATOR IMPLANT  2017   VAGINAL PROLAPSE REPAIR  1992   Patient Active Problem List   Diagnosis Date Noted   History of COVID-19 10/03/2021   Osteoporosis with fracture 09/30/2021   Diverticulitis 09/30/2021   Infectious colitis, enteritis and gastroenteritis 09/30/2021   Peripheral neuropathy 09/30/2021   Closed comminuted intertrochanteric fracture of left femur ([place]) 09/30/2021   Fall at home, initial encounter 09/30/2021   Anemia 09/30/2021   Leukocytosis 09/30/2021   Epigastric pain 04/16/2021   Nausea 04/16/2021   Primary hypertension 04/16/2021   Other constipation 04/16/2021   Chronic pain of left knee 04/16/2021   Chronic right shoulder pain 04/16/2021   Degenerative disc disease, thoracic 04/16/2021   Glaucoma of both  eyes 04/16/2021   Aortic atherosclerosis ([place]) 04/16/2021   Neuropathic pain 05/11/2016    REFERRING DIAG: Left hip fracture (IMN left hip)   THERAPY DIAG:  Pain in left hip  Muscle weakness (generalized)  Other abnormalities of gait and mobility  Rationale for Evaluation and Treatment Rehabilitation  PERTINENT HISTORY: Left hip IMN   PRECAUTIONS: Fall  ONSET DATE: 10/01/2021 DOS   SUBJECTIVE: Patient reports she continues to do well and is consistent with her exercises. She states her left hip still feels weaker than the right.  PAIN:  Are you having pain? No NPRS scale: 0/10   OBJECTIVE: (objective measures completed at initial evaluation unless otherwise dated) PATIENT SURVEYS:  FOTO 44% functional status   MUSCLE LENGTH: Hamstring and quad flexibility limitation   POSTURE:  Rounded shoulder posture   LE MMT:   MMT Right 12/21/2021 Left 12/21/2021 Left 01/20/2022  Hip flexion 4 4-   Hip extension 3 3-   Hip abduction 4- 3- 3-  Knee flexion 5 4   Knee extension 5 4   Ankle dorsiflexion 5 5   Ankle plantarflexion 4 4     FUNCTIONAL TESTS:  5 times sit to stand: 43 seconds Timed up and go (TUG): 27 seconds using SPC 6 minute walk test: 462 ft using SPC   GAIT: Distance walked: 462 ft Assistive device utilized: Single point cane (right) Level of  assistance: Modified independence Comments: patient demonstrates decreased gait speed, decreased stance time on left with decreased step length on right,      TODAY'S TREATMENT: [facility] Adult PT Treatment:                                                DATE: 01/20/2022 Therapeutic Exercise: Nustep L4 x 5 min with UE/LE while taking subjective SLR 2 x 10 each Supine clamshell with green 2 x 10 Sidelying hip abduction 2 x 10 left only - partial range Sit to stand 2 x 10 - without UE support Standing heel/toe raises 2 x 20 Standing hip abduction 2 x 10 each Standing alternating march 2 x 20 Knee extension  machine 10# 2 x 10 Knee flexion machine 15# 2 x 10 Tandem stance 3 x 30 sec each   [facility] Adult PT Treatment:                                                DATE: 01/06/2022 Therapeutic Exercise: Nustep level 4 x 5 mins Supine clamshell GTB 2x10 SLR x10 BIL Standing hip abduction 2x10 BIL Standing alternating march 2x10 BIL Standing hip extension 2x10 BIL Standing heel/toe raises 2x10 Seated LAQ 2x10 BIL Seated hip adduction ball squeeze 5" hold 2x10 Seated LAQ with hip adduction ball squeeze 2x10 Seated hamstring curl GTB 2x10 BIL STS 2x10  [facility] Adult PT Treatment:                                                DATE: 01/06/2022 Therapeutic Exercise: Clamshell x 10 SLR x 10 each Bridge x 10 Standing hip abduction x 10 each Standing alternating march x 20 Standing heel raises x 15     PATIENT EDUCATION:  Education details: HEP update Person educated: Patient Education method: Explanation, Demonstration, Tactile cues, Verbal cues, Handout Education comprehension: verbalized understanding, returned demonstration, verbal cues required, tactile cues required, and needs further education   HOME EXERCISE PROGRAM: Access Code: [email or web address]     ASSESSMENT: CLINICAL IMPRESSION: Patient tolerated therapy well with no adverse effects. Therapy continues to focus primarily on progression of hip and LE strength with good tolerance. Incorporated machine strengthening for quad and hamstring. Incorporated tandem stance for balance training, patient did require occasional UE support with 3rd rep due to fatigue. Updated HEP to add tandem stance. Patient continues to benefit from skilled PT services and should be progressed as able to improve functional independence.    OBJECTIVE IMPAIRMENTS Abnormal gait, decreased activity tolerance, decreased balance, decreased mobility, difficulty walking, and decreased strength.    ACTIVITY LIMITATIONS cleaning, community activity, meal prep, laundry, and  shopping.    PERSONAL FACTORS Age, Fitness, and Time since onset of injury/illness/exacerbation are also affecting patient's functional outcome.      GOALS: Goals reviewed with patient? Yes   SHORT TERM GOALS: Target date: 01/18/2022    Patient will be I with initial HEP in order to progress with therapy. Baseline: HEP provided at eval 01/20/2022: patient independent with initial HEP Goal status: MET   2.  PT will review FOTO  with patient by 3rd visit in order to understand expected progress and outcome with therapy. Baseline: 01/20/2022: reviewed Goal status: MET   LONG TERM GOALS: Target date: 02/15/2022   Patient will be I with final HEP to maintain progress from PT. Baseline: HEP provided at eval Goal status: INITIAL   2.  Patient will report >/= 64% status on FOTO to indicate improved functional ability. Baseline: 44% Goal status: INITIAL   3.  Patient will perform 5xSTS </= 14 seconds to indicate reduced fall risk and improved strength. Baseline: 43 seconds Goal status: INITIAL   4.  Patient will perform TUG in </= 9 seconds with LRAD to indicate improved mobility and reduced fall risk. Baseline: 27 seconds Goal status: INITIAL   5.  Patient will perform 6MWT >/= 1100 ft using LRAD to improve walking ability and community access. Baseline: 462 ft Goal status: INITIAL     PLAN: PT FREQUENCY: 1-2x/week   PT DURATION: 8 weeks   PLANNED INTERVENTIONS: Therapeutic exercises, Therapeutic activity, Neuromuscular re-education, Balance training, Gait training, Patient/Family education, Joint manipulation, Joint mobilization, Aquatic Therapy, Dry Needling, Cryotherapy, Moist heat, Manual therapy, and Re-evaluation   PLAN FOR NEXT SESSION: Review HEP and progress PRN, focus on left hip strengthening, balance training, progress gait training with no AD as able    Hilda Blades, PT, DPT, LAT, ATC 01/20/22  2:51 PM Phone: [phone] Fax: [phone]

## 2022-01-20 ENCOUNTER — Other Ambulatory Visit: Payer: Self-pay

## 2022-01-20 ENCOUNTER — Ambulatory Visit: Payer: Medicare Other | Attending: Orthopedic Surgery | Admitting: Physical Therapy

## 2022-01-20 ENCOUNTER — Encounter: Payer: Self-pay | Admitting: Physical Therapy

## 2022-01-20 DIAGNOSIS — R2689 Other abnormalities of gait and mobility: Secondary | ICD-10-CM | POA: Diagnosis not present

## 2022-01-20 DIAGNOSIS — M25552 Pain in left hip: Secondary | ICD-10-CM | POA: Insufficient documentation

## 2022-01-20 DIAGNOSIS — M6281 Muscle weakness (generalized): Secondary | ICD-10-CM | POA: Diagnosis not present

## 2022-01-27 ENCOUNTER — Ambulatory Visit: Payer: Medicare Other | Admitting: Physical Therapy

## 2022-01-31 DIAGNOSIS — M8080XA Other osteoporosis with current pathological fracture, unspecified site, initial encounter for fracture: Secondary | ICD-10-CM | POA: Diagnosis not present

## 2022-01-31 DIAGNOSIS — G629 Polyneuropathy, unspecified: Secondary | ICD-10-CM | POA: Diagnosis not present

## 2022-01-31 DIAGNOSIS — K5792 Diverticulitis of intestine, part unspecified, without perforation or abscess without bleeding: Secondary | ICD-10-CM | POA: Diagnosis not present

## 2022-01-31 DIAGNOSIS — A09 Infectious gastroenteritis and colitis, unspecified: Secondary | ICD-10-CM | POA: Diagnosis not present

## 2022-02-03 ENCOUNTER — Other Ambulatory Visit: Payer: Self-pay | Admitting: Family Medicine

## 2022-02-03 ENCOUNTER — Ambulatory Visit: Payer: Medicare Other | Admitting: Physical Therapy

## 2022-02-03 DIAGNOSIS — S72142D Displaced intertrochanteric fracture of left femur, subsequent encounter for closed fracture with routine healing: Secondary | ICD-10-CM | POA: Diagnosis not present

## 2022-02-03 DIAGNOSIS — I10 Essential (primary) hypertension: Secondary | ICD-10-CM

## 2022-02-08 NOTE — Therapy (Signed)
OUTPATIENT PHYSICAL THERAPY TREATMENT NOTE   Patient Name: Sandra Mclaughlin MRN: [id number] DOB:[birth year], 88 y.o., female Today's Date: 02/10/2022  PCP: Alroy Dust, L.Marlou Sa, MD REFERRING PROVIDER: Ainsley Spinner, PA-C  END OF SESSION:   PT End of Session - 02/10/22 1216     Visit Number 4    Number of Visits 12    Date for PT Re-Evaluation 04/07/22    Authorization Type BCBS Medicare    Progress Note Due on Visit 10    PT Start Time 1215    PT Stop Time 1300    PT Time Calculation (min) 45 min    Activity Tolerance Patient tolerated treatment well    Behavior During Therapy [facility] for tasks assessed/performed               Past Medical History:  Diagnosis Date   History of bone density study 2022   History of colonoscopy 2015   History of CT scan 2022   History of mammogram 2022   Past Surgical History:  Procedure Laterality Date   ABDOMINAL HYSTERECTOMY  [place] (IM) NAIL INTERTROCHANTERIC Left 10/01/2021   Procedure: INTRAMEDULLARY (IM) NAIL INTERTROCHANTRIC;  Surgeon: Altamese Seymour, MD;  Location: [place];  Service: Orthopedics;  Laterality: Left;   SPINAL CORD STIMULATOR IMPLANT  2017   VAGINAL PROLAPSE REPAIR  1992   Patient Active Problem List   Diagnosis Date Noted   History of COVID-19 10/03/2021   Osteoporosis with fracture 09/30/2021   Diverticulitis 09/30/2021   Infectious colitis, enteritis and gastroenteritis 09/30/2021   Peripheral neuropathy 09/30/2021   Closed comminuted intertrochanteric fracture of left femur ([place]) 09/30/2021   Fall at home, initial encounter 09/30/2021   Anemia 09/30/2021   Leukocytosis 09/30/2021   Epigastric pain 04/16/2021   Nausea 04/16/2021   Primary hypertension 04/16/2021   Other constipation 04/16/2021   Chronic pain of left knee 04/16/2021   Chronic right shoulder pain 04/16/2021   Degenerative disc disease, thoracic 04/16/2021   Glaucoma of both  eyes 04/16/2021   Aortic atherosclerosis ([place]) 04/16/2021   Neuropathic pain 05/11/2016    REFERRING DIAG: Left hip fracture (IMN left hip)   THERAPY DIAG:  Pain in left hip  Muscle weakness (generalized)  Other abnormalities of gait and mobility  Rationale for Evaluation and Treatment Rehabilitation  PERTINENT HISTORY: Left hip IMN   PRECAUTIONS: Fall  ONSET DATE: 10/01/2021 DOS   SUBJECTIVE: Patient reports her left hip is doing better and stronger.  PAIN:  Are you having pain? No NPRS scale: 0/10   OBJECTIVE: (objective measures completed at initial evaluation unless otherwise dated) PATIENT SURVEYS:  FOTO 44% functional status  02/10/2022: 63%   MUSCLE LENGTH: Hamstring and quad flexibility limitation   POSTURE:  Rounded shoulder posture   LE MMT:   MMT Right 12/21/2021 Left 12/21/2021 Left 01/20/2022 Left 02/10/2022  Hip flexion 4 4-    Hip extension 3 3-  3  Hip abduction 4- 3- 3- 3  Knee flexion 5 4    Knee extension 5 4    Ankle dorsiflexion 5 5    Ankle plantarflexion 4 4      FUNCTIONAL TESTS:  5 times sit to stand: 43 seconds  02/10/2022: 15 seconds Timed up and go (TUG): 27 seconds using SPC  02/10/2022: 17 seconds 6 minute walk test: 462 ft using SPC  02/10/2022: 820 ft using SPC   GAIT:  Distance walked: 462 ft  02/10/2022: 820 ft Assistive device utilized: Single point cane (right) Level of assistance: Modified independence Comments: patient demonstrates decreased stance time on left with decreased step length on right, trendelenburg on left      TODAY'S TREATMENT: [facility] Adult PT Treatment:                                                DATE: 02/08/2022 Therapeutic Exercise: Nustep L5 x 5 min with UE/LE while taking subjective Sit to stand 2 x 10 without UE support 6MWT using SPC SLR 2 x 10 each Bridge 2 x 10 Supine clamshell with green 2 x 10 Sidelying hip abduction 2 x 10 left only - partial range Knee extension machine 10# 2 x 10 Tandem  stance 3 x 30 sec each   [facility] Adult PT Treatment:                                                DATE: 01/20/2022 Therapeutic Exercise: Nustep L4 x 5 min with UE/LE while taking subjective SLR 2 x 10 each Supine clamshell with green 2 x 10 Sidelying hip abduction 2 x 10 left only - partial range Sit to stand 2 x 10 - without UE support Standing heel/toe raises 2 x 20 Standing hip abduction 2 x 10 each Standing alternating march 2 x 20 Knee extension machine 10# 2 x 10 Knee flexion machine 15# 2 x 10 Tandem stance 3 x 30 sec each  [facility] Adult PT Treatment:                                                DATE: 01/06/2022 Therapeutic Exercise: Nustep level 4 x 5 mins Supine clamshell GTB 2x10 SLR x10 BIL Standing hip abduction 2x10 BIL Standing alternating march 2x10 BIL Standing hip extension 2x10 BIL Standing heel/toe raises 2x10 Seated LAQ 2x10 BIL Seated hip adduction ball squeeze 5" hold 2x10 Seated LAQ with hip adduction ball squeeze 2x10 Seated hamstring curl GTB 2x10 BIL STS 2x10     PATIENT EDUCATION:  Education details: POC extension, HEP, FOTO Person educated: Patient Education method: Explanation, Demonstration, Tactile cues, Verbal cues Education comprehension: verbalized understanding, returned demonstration, verbal cues required, tactile cues required, and needs further education   HOME EXERCISE PROGRAM: Access Code: [email or web address]     ASSESSMENT: CLINICAL IMPRESSION: Patient tolerated therapy well with no adverse effects. She has progressed very well in therapy demonstrating improvement in her hip and strength and functional measures such as 5xSTS, TUG, and 6MWT. Overall she does continue to exhibit gross strength deficits leading to gait deviations and limitations with ambulation and functional mobility. Therapy continues to focus primarily on strengthening and she is progressing well. No changes made to HEP this visit. Patient continues to benefit from skilled PT  services so will extend her PT POC for 8 more weeks in order to progress strength and improve functional independence.     OBJECTIVE IMPAIRMENTS Abnormal gait, decreased activity tolerance, decreased balance, decreased mobility, difficulty walking, and decreased strength.    ACTIVITY LIMITATIONS cleaning, community activity,  meal prep, laundry, and shopping.    PERSONAL FACTORS Age, Fitness, and Time since onset of injury/illness/exacerbation are also affecting patient's functional outcome.      GOALS: Goals reviewed with patient? Yes   SHORT TERM GOALS: Target date: 01/18/2022    Patient will be I with initial HEP in order to progress with therapy. Baseline: HEP provided at eval 01/20/2022: patient independent with initial HEP Goal status: MET   2.  PT will review FOTO with patient by 3rd visit in order to understand expected progress and outcome with therapy. Baseline: 01/20/2022: reviewed Goal status: MET   LONG TERM GOALS: Target date: 04/07/2022   Patient will be I with final HEP to maintain progress from PT. Baseline: HEP provided at eval 02/10/2022: progressing Goal status: ONGOING   2.  Patient will report >/= 64% status on FOTO to indicate improved functional ability. Baseline: 44% 02/10/2022: 63% Goal status: PARTIALLY MET   3.  Patient will perform 5xSTS </= 14 seconds to indicate reduced fall risk and improved strength. Baseline: 43 seconds 02/10/2022:15 seconds Goal status: PARTIALLY MET   4.  Patient will perform TUG in </= 9 seconds with LRAD to indicate improved mobility and reduced fall risk. Baseline: 27 seconds 02/10/2022: 17 seconds Goal status: PARTIALLY MET   5.  Patient will perform 6MWT >/= 1100 ft using LRAD to improve walking ability and community access. Baseline: 462 ft 02/10/2022: 820 ft Goal status: PARTIALLY MET     PLAN: PT FREQUENCY: 1x/week   PT DURATION: 8 weeks   PLANNED INTERVENTIONS: Therapeutic exercises, Therapeutic activity,  Neuromuscular re-education, Balance training, Gait training, Patient/Family education, Joint manipulation, Joint mobilization, Aquatic Therapy, Dry Needling, Cryotherapy, Moist heat, Manual therapy, and Re-evaluation   PLAN FOR NEXT SESSION: Review HEP and progress PRN, focus on left hip strengthening, balance training, progress gait training with no AD as able    Hilda Blades, PT, DPT, LAT, ATC 02/10/22  1:04 PM Phone: [phone] Fax: [phone]

## 2022-02-10 ENCOUNTER — Other Ambulatory Visit: Payer: Self-pay

## 2022-02-10 ENCOUNTER — Encounter: Payer: Self-pay | Admitting: Physical Therapy

## 2022-02-10 ENCOUNTER — Ambulatory Visit: Payer: Medicare Other | Attending: Orthopedic Surgery | Admitting: Physical Therapy

## 2022-02-10 DIAGNOSIS — R2689 Other abnormalities of gait and mobility: Secondary | ICD-10-CM | POA: Diagnosis not present

## 2022-02-10 DIAGNOSIS — M25552 Pain in left hip: Secondary | ICD-10-CM | POA: Diagnosis not present

## 2022-02-10 DIAGNOSIS — M6281 Muscle weakness (generalized): Secondary | ICD-10-CM | POA: Insufficient documentation

## 2022-02-10 NOTE — Patient Instructions (Signed)
Access Code: [email or web address] URL: [email or web address] Date: 02/10/2022 Prepared by: Rosana Hoes  Exercises - Clamshell  - 1 x daily - 2 sets - 10 reps - Active Straight Leg Raise with Quad Set  - 1 x daily - 2 sets - 10 reps - Sit to Stand  - 1 x daily - 3 sets - 5 reps - Standing Hip Abduction with Counter Support  - 1 x daily - 2 sets - 10 reps - Standing March with Counter Support  - 1 x daily - 2 sets - 20 reps - Heel Raises with Counter Support  - 1 x daily - 2 sets - 15 reps - Standing Tandem Balance with Counter Support  - 1 x daily - 3 reps - 30 seconds hold

## 2022-02-12 ENCOUNTER — Ambulatory Visit
Admission: RE | Admit: 2022-02-12 | Discharge: 2022-02-12 | Disposition: A | Discharge status: Home / Self Care | Payer: Medicare Other | Source: Ambulatory Visit | Attending: Family Medicine | Admitting: Family Medicine

## 2022-02-12 DIAGNOSIS — N281 Cyst of kidney, acquired: Secondary | ICD-10-CM | POA: Diagnosis not present

## 2022-02-12 DIAGNOSIS — I10 Essential (primary) hypertension: Secondary | ICD-10-CM

## 2022-02-16 ENCOUNTER — Encounter: Payer: Medicare Other | Admitting: Physical Therapy

## 2022-02-16 DIAGNOSIS — M810 Age-related osteoporosis without current pathological fracture: Secondary | ICD-10-CM | POA: Diagnosis not present

## 2022-02-16 DIAGNOSIS — Z0000 Encounter for general adult medical examination without abnormal findings: Secondary | ICD-10-CM | POA: Diagnosis not present

## 2022-02-16 DIAGNOSIS — I10 Essential (primary) hypertension: Secondary | ICD-10-CM | POA: Diagnosis not present

## 2022-02-16 DIAGNOSIS — R109 Unspecified abdominal pain: Secondary | ICD-10-CM | POA: Diagnosis not present

## 2022-02-16 DIAGNOSIS — R5383 Other fatigue: Secondary | ICD-10-CM | POA: Diagnosis not present

## 2022-02-16 DIAGNOSIS — G629 Polyneuropathy, unspecified: Secondary | ICD-10-CM | POA: Diagnosis not present

## 2022-02-17 ENCOUNTER — Ambulatory Visit: Payer: Medicare Other | Admitting: Physical Therapy

## 2022-02-17 ENCOUNTER — Other Ambulatory Visit: Payer: Self-pay

## 2022-02-17 ENCOUNTER — Encounter: Payer: Self-pay | Admitting: Physical Therapy

## 2022-02-17 DIAGNOSIS — M25552 Pain in left hip: Secondary | ICD-10-CM

## 2022-02-17 DIAGNOSIS — R2689 Other abnormalities of gait and mobility: Secondary | ICD-10-CM | POA: Diagnosis not present

## 2022-02-17 DIAGNOSIS — M6281 Muscle weakness (generalized): Secondary | ICD-10-CM

## 2022-02-17 DIAGNOSIS — N815 Vaginal enterocele: Secondary | ICD-10-CM | POA: Diagnosis not present

## 2022-02-17 DIAGNOSIS — R350 Frequency of micturition: Secondary | ICD-10-CM | POA: Diagnosis not present

## 2022-02-17 NOTE — Therapy (Signed)
OUTPATIENT PHYSICAL THERAPY TREATMENT NOTE   Patient Name: Sandra Mclaughlin MRN: [id number] DOB:[birth year], 88 y.o., female Today's Date: 02/17/2022  PCP: Alroy Dust, L.Marlou Sa, MD REFERRING PROVIDER: Ainsley Spinner, PA-C  END OF SESSION:   PT End of Session - 02/17/22 1452     Visit Number 5    Number of Visits 12    Date for PT Re-Evaluation 04/07/22    Authorization Type BCBS Medicare    Progress Note Due on Visit 10    PT Start Time 1448    PT Stop Time 1530    PT Time Calculation (min) 42 min    Activity Tolerance Patient tolerated treatment well    Behavior During Therapy [facility] for tasks assessed/performed                Past Medical History:  Diagnosis Date   History of bone density study 2022   History of colonoscopy 2015   History of CT scan 2022   History of mammogram 2022   Past Surgical History:  Procedure Laterality Date   ABDOMINAL HYSTERECTOMY  [place] (IM) NAIL INTERTROCHANTERIC Left 10/01/2021   Procedure: INTRAMEDULLARY (IM) NAIL INTERTROCHANTRIC;  Surgeon: Altamese [place], MD;  Location: [place];  Service: Orthopedics;  Laterality: Left;   SPINAL CORD STIMULATOR IMPLANT  2017   VAGINAL PROLAPSE REPAIR  1992   Patient Active Problem List   Diagnosis Date Noted   History of COVID-19 10/03/2021   Osteoporosis with fracture 09/30/2021   Diverticulitis 09/30/2021   Infectious colitis, enteritis and gastroenteritis 09/30/2021   Peripheral neuropathy 09/30/2021   Closed comminuted intertrochanteric fracture of left femur ([place]) 09/30/2021   Fall at home, initial encounter 09/30/2021   Anemia 09/30/2021   Leukocytosis 09/30/2021   Epigastric pain 04/16/2021   Nausea 04/16/2021   Primary hypertension 04/16/2021   Other constipation 04/16/2021   Chronic pain of left knee 04/16/2021   Chronic right shoulder pain 04/16/2021   Degenerative disc disease, thoracic 04/16/2021   Glaucoma of  both eyes 04/16/2021   Aortic atherosclerosis ([place]) 04/16/2021   Neuropathic pain 05/11/2016    REFERRING DIAG: Left hip fracture (IMN left hip)   THERAPY DIAG:  Pain in left hip  Muscle weakness (generalized)  Other abnormalities of gait and mobility  Rationale for Evaluation and Treatment Rehabilitation  PERTINENT HISTORY: Left hip IMN   PRECAUTIONS: Fall  ONSET DATE: 10/01/2021 DOS   SUBJECTIVE: Patient reports she feels her left hip is getting stronger. She states she did a lot of walking earlier today so her hip is tired.  PAIN:  Are you having pain? No NPRS scale: 0/10   OBJECTIVE: (objective measures completed at initial evaluation unless otherwise dated) PATIENT SURVEYS:  FOTO 44% functional status  02/10/2022: 63%   MUSCLE LENGTH: Hamstring and quad flexibility limitation   POSTURE:  Rounded shoulder posture   LE MMT:   MMT Right 12/21/2021 Left 12/21/2021 Left 01/20/2022 Left 02/10/2022  Hip flexion 4 4-    Hip extension 3 3-  3  Hip abduction 4- 3- 3- 3  Knee flexion 5 4    Knee extension 5 4    Ankle dorsiflexion 5 5    Ankle plantarflexion 4 4      FUNCTIONAL TESTS:  5 times sit to stand: 43 seconds  02/10/2022: 15 seconds Timed up and go (TUG): 27 seconds using SPC  02/10/2022: 17 seconds 6  minute walk test: 462 ft using [facility]  02/10/2022: 820 ft using SPC   GAIT: Distance walked: 462 ft  02/10/2022: 820 ft Assistive device utilized: Single point cane (right) Level of assistance: Modified independence Comments: patient demonstrates decreased stance time on left with decreased step length on right, trendelenburg on left      TODAY'S TREATMENT: [facility] Adult PT Treatment:                                                DATE: 02/17/2022 Therapeutic Exercise: Nustep L5 x 5 min with UE/LE while taking subjective Sit to stand x 10 without UE support, holding 10# x 10, 5# x 10 Forward step-up 2 x 10 each, right 6", left 4" Standing heel raises 2 x  20 Standing hip abduction 2 x 15 each Standing hip extension 2 x 10 each Modified 3/4 tandem stance on Airex 3 x 30 sec each   [facility] Adult PT Treatment:                                                DATE: 02/10/2022 Therapeutic Exercise: Nustep L5 x 5 min with UE/LE while taking subjective Sit to stand 2 x 10 without UE support 6MWT using SPC SLR 2 x 10 each Bridge 2 x 10 Supine clamshell with green 2 x 10 Sidelying hip abduction 2 x 10 left only - partial range Knee extension machine 10# 2 x 10 Tandem stance 3 x 30 sec each  [facility] Adult PT Treatment:                                                DATE: 01/20/2022 Therapeutic Exercise: Nustep L4 x 5 min with UE/LE while taking subjective SLR 2 x 10 each Supine clamshell with green 2 x 10 Sidelying hip abduction 2 x 10 left only - partial range Sit to stand 2 x 10 - without UE support Standing heel/toe raises 2 x 20 Standing hip abduction 2 x 10 each Standing alternating march 2 x 20 Knee extension machine 10# 2 x 10 Knee flexion machine 15# 2 x 10 Tandem stance 3 x 30 sec each   PATIENT EDUCATION:  Education details: HEP Person educated: Patient Education method: Consulting civil engineer, Demonstration, Corporate treasurer cues, Verbal cues Education comprehension: verbalized understanding, returned demonstration, verbal cues required, tactile cues required, and needs further education   HOME EXERCISE PROGRAM: Access Code: [email or web address]     ASSESSMENT: CLINICAL IMPRESSION: Patient tolerated therapy well with no adverse effects. Therapy focused on progressing her strength and incorporated more standing exercises this visit. She was able to tolerate addition of weight with sit to stand exercise, reporting difficulty with 10# but able to complete without any pain. She was unable to perform 6" step up on left due to fatigue but she was able to complete exercise with 4" box. Progressed to using Airex but patient unable to perform with full tandem so regressed  to 3/4 tandem with better tolerance. No changes to HEP this visit. Patient would benefit from continued skilled PT to progress her mobility and strength in  order to maximize her functional ability.    OBJECTIVE IMPAIRMENTS Abnormal gait, decreased activity tolerance, decreased balance, decreased mobility, difficulty walking, and decreased strength.    ACTIVITY LIMITATIONS cleaning, community activity, meal prep, laundry, and shopping.    PERSONAL FACTORS Age, Fitness, and Time since onset of injury/illness/exacerbation are also affecting patient's functional outcome.      GOALS: Goals reviewed with patient? Yes   SHORT TERM GOALS: Target date: 01/18/2022    Patient will be I with initial HEP in order to progress with therapy. Baseline: HEP provided at eval 01/20/2022: patient independent with initial HEP Goal status: MET   2.  PT will review FOTO with patient by 3rd visit in order to understand expected progress and outcome with therapy. Baseline: 01/20/2022: reviewed Goal status: MET   LONG TERM GOALS: Target date: 04/07/2022   Patient will be I with final HEP to maintain progress from PT. Baseline: HEP provided at eval 02/10/2022: progressing Goal status: ONGOING   2.  Patient will report >/= 64% status on FOTO to indicate improved functional ability. Baseline: 44% 02/10/2022: 63% Goal status: PARTIALLY MET   3.  Patient will perform 5xSTS </= 14 seconds to indicate reduced fall risk and improved strength. Baseline: 43 seconds 02/10/2022:15 seconds Goal status: PARTIALLY MET   4.  Patient will perform TUG in </= 9 seconds with LRAD to indicate improved mobility and reduced fall risk. Baseline: 27 seconds 02/10/2022: 17 seconds Goal status: PARTIALLY MET   5.  Patient will perform 6MWT >/= 1100 ft using LRAD to improve walking ability and community access. Baseline: 462 ft 02/10/2022: 820 ft Goal status: PARTIALLY MET     PLAN: PT FREQUENCY: 1x/week   PT DURATION: 8 weeks    PLANNED INTERVENTIONS: Therapeutic exercises, Therapeutic activity, Neuromuscular re-education, Balance training, Gait training, Patient/Family education, Joint manipulation, Joint mobilization, Aquatic Therapy, Dry Needling, Cryotherapy, Moist heat, Manual therapy, and Re-evaluation   PLAN FOR NEXT SESSION: Review HEP and progress PRN, focus on left hip strengthening, balance training, progress gait training with no AD as able    Hilda Blades, PT, DPT, LAT, ATC 02/17/22  3:30 PM Phone: [phone] Fax: [phone]

## 2022-02-22 NOTE — Therapy (Addendum)
OUTPATIENT PHYSICAL THERAPY TREATMENT NOTE  DISCHARGE   Patient Name: Sandra Mclaughlin MRN: [id number] DOB:May 10, 1931, 88 y.o., female Today's Date: 02/23/2022  PCP: Alroy Dust, L.Marlou Sa, MD REFERRING PROVIDER: Ainsley Spinner, PA-C  END OF SESSION:   PT End of Session - 02/23/22 1216     Visit Number 6    Number of Visits 12    Date for PT Re-Evaluation 04/07/22    Authorization Type BCBS Medicare    Progress Note Due on Visit 10    PT Start Time 1215    PT Stop Time 1255    PT Time Calculation (min) 40 min    Activity Tolerance Patient tolerated treatment well    Behavior During Therapy [facility] for tasks assessed/performed                 Past Medical History:  Diagnosis Date   History of bone density study 2022   History of colonoscopy 2015   History of CT scan 2022   History of mammogram 2022   Past Surgical History:  Procedure Laterality Date   ABDOMINAL HYSTERECTOMY  [place] (IM) NAIL INTERTROCHANTERIC Left 10/01/2021   Procedure: INTRAMEDULLARY ([place]) NAIL INTERTROCHANTRIC;  Surgeon: Altamese [place], MD;  Location: [place];  Service: Orthopedics;  Laterality: Left;   SPINAL CORD STIMULATOR IMPLANT  2017   VAGINAL PROLAPSE REPAIR  1992   Patient Active Problem List   Diagnosis Date Noted   History of COVID-19 10/03/2021   Osteoporosis with fracture 09/30/2021   Diverticulitis 09/30/2021   Infectious colitis, enteritis and gastroenteritis 09/30/2021   Peripheral neuropathy 09/30/2021   Closed comminuted intertrochanteric fracture of left femur ([place]) 09/30/2021   Fall at home, initial encounter 09/30/2021   Anemia 09/30/2021   Leukocytosis 09/30/2021   Epigastric pain 04/16/2021   Nausea 04/16/2021   Primary hypertension 04/16/2021   Other constipation 04/16/2021   Chronic pain of left knee 04/16/2021   Chronic right shoulder pain 04/16/2021   Degenerative disc disease, thoracic 04/16/2021    Glaucoma of both eyes 04/16/2021   Aortic atherosclerosis ([place]) 04/16/2021   Neuropathic pain 05/11/2016    REFERRING DIAG: Left hip fracture (IMN left hip)   THERAPY DIAG:  Pain in left hip  Muscle weakness (generalized)  Other abnormalities of gait and mobility  Rationale for Evaluation and Treatment Rehabilitation  PERTINENT HISTORY: Left hip IMN   PRECAUTIONS: Fall  ONSET DATE: 10/01/2021 DOS   SUBJECTIVE: Patient reports she is doing well. She feels she continues to improve and get stronger.  PAIN:  Are you having pain? No NPRS scale: 0/10   OBJECTIVE: (objective measures completed at initial evaluation unless otherwise dated) PATIENT SURVEYS:  FOTO 44% functional status  02/10/2022: 63%   MUSCLE LENGTH: Hamstring and quad flexibility limitation   POSTURE:  Rounded shoulder posture   LE MMT:   MMT Right 12/21/2021 Left 12/21/2021 Left 01/20/2022 Left 02/10/2022 Left 02/23/2022  Hip flexion 4 4-     Hip extension 3 3-  3 3  Hip abduction 4- 3- 3- 3 3+  Knee flexion 5 4     Knee extension 5 4     Ankle dorsiflexion 5 5     Ankle plantarflexion 4 4       FUNCTIONAL TESTS:  5 times sit to stand: 43 seconds  02/10/2022: 15 seconds Timed up and go (TUG): 27 seconds using SPC  02/10/2022: 17  seconds 6 minute walk test: 462 ft using [facility]  02/10/2022: 820 ft using SPC   GAIT: Distance walked: 462 ft  02/10/2022: 820 ft Assistive device utilized: Single point cane (right) Level of assistance: Modified independence Comments: patient demonstrates decreased stance time on left with decreased step length on right, trendelenburg on left      TODAY'S TREATMENT: [facility] Adult PT Treatment:                                                DATE: 02/23/2022 Therapeutic Exercise: Nustep L5 x 5 min with UE/LE while taking subjective SLR 2 x 10 each Hooklying clamshell with red 2 x 15 each Sit to stand holding 10# 2 x 10 LAQ with 5# 2 x 15 each Forward step-up 4" 2 x 10  each Tandem stance 3 x 30 sec each Standing hip abduction with red at knees 2 x 10 each Standing hip extension with red at knees 2 x 10 each   [facility] Adult PT Treatment:                                                DATE: 02/17/2022 Therapeutic Exercise: Nustep L5 x 5 min with UE/LE while taking subjective Sit to stand x 10 without UE support, holding 10# x 10, 5# x 10 Forward step-up 2 x 10 each, right 6", left 4" Standing heel raises 2 x 20 Standing hip abduction 2 x 15 each Standing hip extension 2 x 10 each Modified 3/4 tandem stance on Airex 3 x 30 sec each  [facility] Adult PT Treatment:                                                DATE: 02/10/2022 Therapeutic Exercise: Nustep L5 x 5 min with UE/LE while taking subjective Sit to stand 2 x 10 without UE support 6MWT using SPC SLR 2 x 10 each Bridge 2 x 10 Supine clamshell with green 2 x 10 Sidelying hip abduction 2 x 10 left only - partial range Knee extension machine 10# 2 x 10 Tandem stance 3 x 30 sec each   PATIENT EDUCATION:  Education details: HEP Person educated: Patient Education method: Consulting civil engineer, Demonstration, Corporate treasurer cues, Verbal cues Education comprehension: verbalized understanding, returned demonstration, verbal cues required, tactile cues required, and needs further education   HOME EXERCISE PROGRAM: Access Code: [email or web address]     ASSESSMENT: CLINICAL IMPRESSION: Patient tolerated therapy well with no adverse effects. She does exhibit a slight improvement in hip strength this visit. Therapy continues to focus on strengthening for the hip and legs. She seems to be progressing well and denies any pain with therapy. She does continue to have greater difficulty performing exercises on the left side and reports difficulty lying on the left side. No changes to HEP. Patient would benefit from continued skilled PT to progress her mobility and strength in order to maximize her functional ability.    OBJECTIVE IMPAIRMENTS  Abnormal gait, decreased activity tolerance, decreased balance, decreased mobility, difficulty walking, and decreased strength.    ACTIVITY LIMITATIONS cleaning, community activity, meal  prep, laundry, and shopping.    PERSONAL FACTORS Age, Fitness, and Time since onset of injury/illness/exacerbation are also affecting patient's functional outcome.      GOALS: Goals reviewed with patient? Yes   SHORT TERM GOALS: Target date: 01/18/2022    Patient will be I with initial HEP in order to progress with therapy. Baseline: HEP provided at eval 01/20/2022: patient independent with initial HEP Goal status: MET   2.  PT will review FOTO with patient by 3rd visit in order to understand expected progress and outcome with therapy. Baseline: 01/20/2022: reviewed Goal status: MET   LONG TERM GOALS: Target date: 04/07/2022   Patient will be I with final HEP to maintain progress from PT. Baseline: HEP provided at eval 02/10/2022: progressing Goal status: ONGOING   2.  Patient will report >/= 64% status on FOTO to indicate improved functional ability. Baseline: 44% 02/10/2022: 63% Goal status: PARTIALLY MET   3.  Patient will perform 5xSTS </= 14 seconds to indicate reduced fall risk and improved strength. Baseline: 43 seconds 02/10/2022:15 seconds Goal status: PARTIALLY MET   4.  Patient will perform TUG in </= 9 seconds with LRAD to indicate improved mobility and reduced fall risk. Baseline: 27 seconds 02/10/2022: 17 seconds Goal status: PARTIALLY MET   5.  Patient will perform 6MWT >/= 1100 ft using LRAD to improve walking ability and community access. Baseline: 462 ft 02/10/2022: 820 ft Goal status: PARTIALLY MET     PLAN: PT FREQUENCY: 1x/week   PT DURATION: 8 weeks   PLANNED INTERVENTIONS: Therapeutic exercises, Therapeutic activity, Neuromuscular re-education, Balance training, Gait training, Patient/Family education, Joint manipulation, Joint mobilization, Aquatic Therapy, Dry  Needling, Cryotherapy, Moist heat, Manual therapy, and Re-evaluation   PLAN FOR NEXT SESSION: Review HEP and progress PRN, focus on left hip strengthening, balance training, progress gait training with no AD as able    Hilda Blades, PT, DPT, LAT, ATC 02/23/22  1:01 PM Phone: [phone] Fax: [phone]    PHYSICAL THERAPY DISCHARGE SUMMARY  Visits from Start of Care: 6  Current functional level related to goals / functional outcomes: See above   Remaining deficits: See above   Education / Equipment: HEP   Patient agrees to discharge. Patient goals were partially met. Patient is being discharged due to not returning since the last visit.  Hilda Blades, PT, DPT, LAT, ATC 04/27/22  3:19 PM Phone: [phone] Fax: [phone]

## 2022-02-23 ENCOUNTER — Ambulatory Visit: Payer: Medicare Other | Admitting: Physical Therapy

## 2022-02-23 ENCOUNTER — Other Ambulatory Visit: Payer: Self-pay

## 2022-02-23 ENCOUNTER — Encounter: Payer: Self-pay | Admitting: Physical Therapy

## 2022-02-23 DIAGNOSIS — M6281 Muscle weakness (generalized): Secondary | ICD-10-CM | POA: Diagnosis not present

## 2022-02-23 DIAGNOSIS — R2689 Other abnormalities of gait and mobility: Secondary | ICD-10-CM

## 2022-02-23 DIAGNOSIS — M25552 Pain in left hip: Secondary | ICD-10-CM | POA: Diagnosis not present

## 2022-03-02 ENCOUNTER — Encounter: Payer: Medicare Other | Admitting: Physical Therapy

## 2022-03-08 DIAGNOSIS — R159 Full incontinence of feces: Secondary | ICD-10-CM | POA: Diagnosis not present

## 2022-03-09 ENCOUNTER — Ambulatory Visit: Payer: Medicare Other | Admitting: Physical Therapy

## 2022-03-16 ENCOUNTER — Ambulatory Visit: Payer: Medicare Other | Admitting: Physical Therapy

## 2022-04-14 DIAGNOSIS — S72142D Displaced intertrochanteric fracture of left femur, subsequent encounter for closed fracture with routine healing: Secondary | ICD-10-CM | POA: Diagnosis not present

## 2022-04-20 ENCOUNTER — Other Ambulatory Visit (HOSPITAL_BASED_OUTPATIENT_CLINIC_OR_DEPARTMENT_OTHER): Payer: Self-pay | Admitting: Physician Assistant

## 2022-04-20 ENCOUNTER — Other Ambulatory Visit: Payer: Self-pay | Admitting: Physician Assistant

## 2022-04-20 DIAGNOSIS — R1032 Left lower quadrant pain: Secondary | ICD-10-CM

## 2022-04-20 DIAGNOSIS — R1904 Left lower quadrant abdominal swelling, mass and lump: Secondary | ICD-10-CM | POA: Diagnosis not present

## 2022-04-21 ENCOUNTER — Ambulatory Visit (HOSPITAL_BASED_OUTPATIENT_CLINIC_OR_DEPARTMENT_OTHER)
Admission: RE | Admit: 2022-04-21 | Discharge: 2022-04-21 | Disposition: A | Discharge status: Home / Self Care | Payer: Medicare Other | Source: Ambulatory Visit | Attending: Physician Assistant | Admitting: Physician Assistant

## 2022-04-21 ENCOUNTER — Encounter (HOSPITAL_BASED_OUTPATIENT_CLINIC_OR_DEPARTMENT_OTHER): Payer: Self-pay

## 2022-04-21 DIAGNOSIS — R1032 Left lower quadrant pain: Secondary | ICD-10-CM | POA: Diagnosis not present

## 2022-04-21 DIAGNOSIS — R109 Unspecified abdominal pain: Secondary | ICD-10-CM | POA: Diagnosis not present

## 2022-04-21 LAB — POCT I-STAT CREATININE: Creatinine, Ser: 0.7 mg/dL (ref 0.44–1.00)

## 2022-04-21 MED ORDER — IOHEXOL 300 MG/ML  SOLN
100.0000 mL | Freq: Once | INTRAMUSCULAR | Status: AC | PRN
  Administered 2022-04-21: 75 mL via INTRAVENOUS

## 2022-05-06 ENCOUNTER — Ambulatory Visit: Payer: Self-pay | Admitting: General Surgery

## 2022-05-06 DIAGNOSIS — K4090 Unilateral inguinal hernia, without obstruction or gangrene, not specified as recurrent: Secondary | ICD-10-CM | POA: Diagnosis not present

## 2022-05-13 NOTE — Patient Instructions (Signed)
DUE TO COVID-19 ONLY TWO VISITORS  (aged 86 and older)  ARE ALLOWED TO COME WITH YOU AND STAY IN THE WAITING ROOM ONLY DURING PRE OP AND PROCEDURE.   **NO VISITORS ARE ALLOWED IN THE SHORT STAY AREA OR RECOVERY ROOM!!**  IF YOU WILL BE ADMITTED INTO THE HOSPITAL YOU ARE ALLOWED ONLY FOUR SUPPORT PEOPLE DURING VISITATION HOURS ONLY (7 AM -8PM)   The support person(s) must pass our screening, gel in and out, and wear a mask at all times, including in the patient's room. Patients must also wear a mask when staff or their support person are in the room. Visitors GUEST BADGE MUST BE WORN VISIBLY  One adult visitor may remain with you overnight and MUST be in the room by 8 P.M.     Your procedure is scheduled on: 05/20/22   Report to [facility] Main Entrance    Report to admitting at  5:30 AM   Call this number if you have problems the morning of surgery [phone]   Do not eat food or drink :After Midnight.           If you have questions, please contact your surgeon's office.   Oral Hygiene is also important to reduce your risk of infection.                                    Remember - BRUSH YOUR TEETH THE MORNING OF SURGERY WITH YOUR REGULAR TOOTHPASTE   Do NOT smoke after Midnight   Take these medicines the morning of surgery with A SIP OF WATER: amlodipine-Norvasc                                You may not have any metal on your body including hair pins, jewelry, and body piercing             Do not wear make-up, lotions, powders, perfumes/cologne, or deodorant  Do not wear nail polish including gel and S&S, artificial/acrylic nails, or any other type of covering on natural nails including finger and toenails. If you have artificial nails, gel coating, etc. that needs to be removed by a nail salon please have this removed prior to surgery or surgery may need to be canceled/ delayed if the surgeon/ anesthesia feels like they are unable to be safely monitored.   Do not  shave  48 hours prior to surgery.     Do not bring valuables to the hospital. [place].   Contacts, dentures or bridgework may not be worn into surgery.   Bring small overnight bag day of surgery.   DO NOT [place]. PHARMACY WILL DISPENSE MEDICATIONS LISTED ON YOUR MEDICATION LIST TO YOU DURING YOUR ADMISSION [place]!       Special Instructions: Bring a copy of your healthcare power of attorney and living will documents  the day of surgery if you haven't scanned them before.              Please read over the following fact sheets you were given: IF YOU HAVE QUESTIONS ABOUT YOUR PRE-OP INSTRUCTIONS PLEASE CALL [facility] - Preparing for Surgery Before surgery, you can play  an important role.  Because skin is not sterile, your skin needs to be as free of germs as possible.  You can reduce the number of germs on your skin by washing with CHG (chlorahexidine gluconate) soap before surgery.  CHG is an antiseptic cleaner which kills germs and bonds with the skin to continue killing germs even after washing. Please DO NOT use if you have an allergy to CHG or antibacterial soaps.  If your skin becomes reddened/irritated stop using the CHG and inform your nurse when you arrive at Short Stay. Do not shave (including legs and underarms) for at least 48 hours prior to the first CHG shower.   Please follow these instructions carefully:  1.  Shower with CHG Soap the night before surgery and the  morning of Surgery.  2.  If you choose to wash your hair, wash your hair first as usual with your  normal  shampoo.  3.  After you shampoo, rinse your hair and body thoroughly to remove the  shampoo.                            4.  Use CHG as you would any other liquid soap.  You can apply chg directly  to the skin and wash                       Gently with a scrungie or clean washcloth.  5.  Apply the  CHG Soap to your body ONLY FROM THE NECK DOWN.   Do not use on face/ open                           Wound or open sores. Avoid contact with eyes, ears mouth and genitals (private parts).                       Wash face,  Genitals (private parts) with your normal soap.             6.  Wash thoroughly, paying special attention to the area where your surgery  will be performed.  7.  Thoroughly rinse your body with warm water from the neck down.  8.  DO NOT shower/wash with your normal soap after using and rinsing off  the CHG Soap.                9.  Pat yourself dry with a clean towel.            10.  Wear clean pajamas.            11.  Place clean sheets on your bed the night of your first shower and do not  sleep with pets. Day of Surgery : Do not apply any lotions/deodorants the morning of surgery.  Please wear clean clothes to the hospital/surgery center.  FAILURE TO FOLLOW THESE INSTRUCTIONS MAY RESULT IN THE CANCELLATION OF YOUR SURGERY    ________________________________________________________________________

## 2022-05-17 ENCOUNTER — Encounter (HOSPITAL_COMMUNITY)
Admission: RE | Admit: 2022-05-17 | Discharge: 2022-05-17 | Disposition: A | Discharge status: Home / Self Care | Payer: Medicare Other | Source: Ambulatory Visit | Attending: General Surgery | Admitting: General Surgery

## 2022-05-17 ENCOUNTER — Other Ambulatory Visit: Payer: Self-pay

## 2022-05-17 ENCOUNTER — Encounter (HOSPITAL_COMMUNITY): Payer: Self-pay

## 2022-05-17 DIAGNOSIS — Z01818 Encounter for other preprocedural examination: Secondary | ICD-10-CM | POA: Insufficient documentation

## 2022-05-17 DIAGNOSIS — I10 Essential (primary) hypertension: Secondary | ICD-10-CM

## 2022-05-17 HISTORY — DX: Nonrheumatic mitral (valve) prolapse: I34.1

## 2022-05-17 HISTORY — DX: Unspecified osteoarthritis, unspecified site: M19.90

## 2022-05-17 HISTORY — DX: Essential (primary) hypertension: I10

## 2022-05-17 LAB — CBC
HCT: 42.1 % (ref 36.0–46.0)
Hemoglobin: 13 g/dL (ref 12.0–15.0)
MCH: 28.6 pg (ref 26.0–34.0)
MCHC: 30.9 g/dL (ref 30.0–36.0)
MCV: 92.5 fL (ref 80.0–100.0)
Platelets: 204 10*3/uL (ref 150–400)
RBC: 4.55 MIL/uL (ref 3.87–5.11)
RDW: 15.5 % (ref 11.5–15.5)
WBC: 7.8 10*3/uL (ref 4.0–10.5)
nRBC: 0 % (ref 0.0–0.2)

## 2022-05-17 LAB — BASIC METABOLIC PANEL
Anion gap: 4 — ABNORMAL LOW (ref 5–15)
BUN: 13 mg/dL (ref 8–23)
CO2: 28 mmol/L (ref 22–32)
Calcium: 9.3 mg/dL (ref 8.9–10.3)
Chloride: 106 mmol/L (ref 98–111)
Creatinine, Ser: 0.63 mg/dL (ref 0.44–1.00)
GFR, Estimated: >60 mL/min (ref >60)
Glucose, Bld: 94 mg/dL (ref 70–99)
Potassium: 4.1 mmol/L (ref 3.5–5.1)
Sodium: 138 mmol/L (ref 135–145)

## 2022-05-17 NOTE — Progress Notes (Signed)
Anesthesia note:  Bowel prep reminder:NA  PCP - Dr. Leonia Corona Cardiologist -none Other-   Chest x-ray - 10/04/21-epic EKG - 09/30/21-epic Stress Test - no ECHO - 10/03/21-epic Cardiac Cath - no  Pacemaker/ICD device last checked:NA Pt has a spinal cord stimulator in place. The battery has quit and the Pt didn't do anything about it because it didn't help.  Sleep Study - no CPAP -   Pt is pre diabetic-NA Fasting Blood Sugar -  Checks Blood Sugar _____  Blood Thinner:NA Blood Thinner Instructions: Aspirin Instructions: Last Dose:  Anesthesia review: yes  Patient denies shortness of breath, fever, cough and chest pain at PAT appointment Pt uses a cane for stability when out of the house. She had an IM nail in Feb of 23.  Patient verbalized understanding of instructions that were given to them at the PAT appointment. Patient was also instructed that they will need to review over the PAT instructions again at home before surgery.yes

## 2022-05-20 ENCOUNTER — Encounter (HOSPITAL_COMMUNITY)
Admission: RE | Disposition: A | Discharge status: Home / Self Care | Payer: Self-pay | Source: Home / Self Care | Attending: General Surgery

## 2022-05-20 ENCOUNTER — Ambulatory Visit (HOSPITAL_COMMUNITY)
Admission: RE | Admit: 2022-05-20 | Discharge: 2022-05-20 | Disposition: A | Discharge status: Home / Self Care | Payer: Medicare Other | Attending: General Surgery | Admitting: General Surgery

## 2022-05-20 ENCOUNTER — Ambulatory Visit (HOSPITAL_COMMUNITY): Payer: Medicare Other | Admitting: Physician Assistant

## 2022-05-20 ENCOUNTER — Ambulatory Visit (HOSPITAL_BASED_OUTPATIENT_CLINIC_OR_DEPARTMENT_OTHER): Payer: Medicare Other | Admitting: Certified Registered Nurse Anesthetist

## 2022-05-20 ENCOUNTER — Other Ambulatory Visit: Payer: Self-pay

## 2022-05-20 ENCOUNTER — Encounter (HOSPITAL_COMMUNITY): Payer: Self-pay | Admitting: General Surgery

## 2022-05-20 DIAGNOSIS — K4090 Unilateral inguinal hernia, without obstruction or gangrene, not specified as recurrent: Secondary | ICD-10-CM

## 2022-05-20 DIAGNOSIS — I10 Essential (primary) hypertension: Secondary | ICD-10-CM | POA: Diagnosis not present

## 2022-05-20 DIAGNOSIS — G8918 Other acute postprocedural pain: Secondary | ICD-10-CM | POA: Diagnosis not present

## 2022-05-20 DIAGNOSIS — Z7901 Long term (current) use of anticoagulants: Secondary | ICD-10-CM | POA: Diagnosis not present

## 2022-05-20 DIAGNOSIS — K4190 Unilateral femoral hernia, without obstruction or gangrene, not specified as recurrent: Secondary | ICD-10-CM | POA: Diagnosis not present

## 2022-05-20 HISTORY — PX: INGUINAL HERNIA REPAIR: SHX194

## 2022-05-20 SURGERY — REPAIR, HERNIA, INGUINAL, ADULT
Anesthesia: Regional | Laterality: Left

## 2022-05-20 MED ORDER — DEXAMETHASONE SODIUM PHOSPHATE 10 MG/ML IJ SOLN
INTRAMUSCULAR | Status: AC
  Filled 2022-05-20: qty 1

## 2022-05-20 MED ORDER — PROPOFOL 500 MG/50ML IV EMUL
INTRAVENOUS | Status: DC | PRN
  Administered 2022-05-20: 75 ug/kg/min via INTRAVENOUS

## 2022-05-20 MED ORDER — CHLORHEXIDINE GLUCONATE CLOTH 2 % EX PADS: 6.0000 | MEDICATED_PAD | Freq: Once | CUTANEOUS | Status: DC

## 2022-05-20 MED ORDER — OXYCODONE HCL 5 MG PO TABS: 5.0000 mg | ORAL_TABLET | Freq: Four times a day (QID) | ORAL | 0 refills | Status: DC | PRN

## 2022-05-20 MED ORDER — FENTANYL CITRATE (PF) 100 MCG/2ML IJ SOLN
INTRAMUSCULAR | Status: DC | PRN
  Administered 2022-05-20: 25 ug via INTRAVENOUS

## 2022-05-20 MED ORDER — FENTANYL CITRATE PF 50 MCG/ML IJ SOSY: 25.0000 ug | PREFILLED_SYRINGE | INTRAMUSCULAR | Status: DC | PRN

## 2022-05-20 MED ORDER — SUGAMMADEX SODIUM 200 MG/2ML IV SOLN
INTRAVENOUS | Status: DC | PRN
  Administered 2022-05-20: 100 mg via INTRAVENOUS

## 2022-05-20 MED ORDER — ORAL CARE MOUTH RINSE: 15.0000 mL | Freq: Once | OROMUCOSAL | Status: AC

## 2022-05-20 MED ORDER — PHENYLEPHRINE HCL-NACL 20-0.9 MG/250ML-% IV SOLN
INTRAVENOUS | Status: AC
  Filled 2022-05-20: qty 500

## 2022-05-20 MED ORDER — CEFAZOLIN SODIUM-DEXTROSE 2-4 GM/100ML-% IV SOLN
2.0000 g | INTRAVENOUS | Status: AC
  Administered 2022-05-20: 2 g via INTRAVENOUS
  Filled 2022-05-20: qty 100

## 2022-05-20 MED ORDER — CHLORHEXIDINE GLUCONATE 0.12 % MT SOLN
15.0000 mL | Freq: Once | OROMUCOSAL | Status: AC
  Administered 2022-05-20: 15 mL via OROMUCOSAL

## 2022-05-20 MED ORDER — BUPIVACAINE-EPINEPHRINE 0.25% -1:200000 IJ SOLN
INTRAMUSCULAR | Status: DC | PRN
  Administered 2022-05-20: 10 mL

## 2022-05-20 MED ORDER — 0.9 % SODIUM CHLORIDE (POUR BTL) OPTIME
TOPICAL | Status: DC | PRN
  Administered 2022-05-20: 1000 mL

## 2022-05-20 MED ORDER — ACETAMINOPHEN 500 MG PO TABS
1000.0000 mg | ORAL_TABLET | ORAL | Status: AC
  Administered 2022-05-20: 1000 mg via ORAL
  Filled 2022-05-20: qty 2

## 2022-05-20 MED ORDER — LIDOCAINE HCL (PF) 1 % IJ SOLN
INTRAMUSCULAR | Status: AC
  Filled 2022-05-20: qty 30

## 2022-05-20 MED ORDER — CELECOXIB 100 MG PO CAPS
100.0000 mg | ORAL_CAPSULE | ORAL | Status: AC
  Administered 2022-05-20: 100 mg via ORAL
  Filled 2022-05-20: qty 1

## 2022-05-20 MED ORDER — FENTANYL CITRATE (PF) 100 MCG/2ML IJ SOLN
INTRAMUSCULAR | Status: AC
  Filled 2022-05-20: qty 2

## 2022-05-20 MED ORDER — ONDANSETRON HCL 4 MG/2ML IJ SOLN
INTRAMUSCULAR | Status: AC
  Filled 2022-05-20: qty 2

## 2022-05-20 MED ORDER — PROPOFOL 10 MG/ML IV BOLUS
INTRAVENOUS | Status: DC | PRN
  Administered 2022-05-20: 75 mg via INTRAVENOUS

## 2022-05-20 MED ORDER — LACTATED RINGERS IV SOLN: INTRAVENOUS | Status: DC

## 2022-05-20 MED ORDER — GABAPENTIN 100 MG PO CAPS
100.0000 mg | ORAL_CAPSULE | ORAL | Status: AC
  Administered 2022-05-20: 100 mg via ORAL
  Filled 2022-05-20: qty 1

## 2022-05-20 MED ORDER — BUPIVACAINE-EPINEPHRINE (PF) 0.25% -1:200000 IJ SOLN
INTRAMUSCULAR | Status: AC
  Filled 2022-05-20: qty 30

## 2022-05-20 MED ORDER — ROCURONIUM BROMIDE 10 MG/ML (PF) SYRINGE
PREFILLED_SYRINGE | INTRAVENOUS | Status: DC | PRN
  Administered 2022-05-20: 30 mg via INTRAVENOUS

## 2022-05-20 MED ORDER — ONDANSETRON HCL 4 MG/2ML IJ SOLN
INTRAMUSCULAR | Status: DC | PRN
  Administered 2022-05-20: 4 mg via INTRAVENOUS

## 2022-05-20 MED ORDER — PROPOFOL 500 MG/50ML IV EMUL
INTRAVENOUS | Status: AC
  Filled 2022-05-20: qty 50

## 2022-05-20 MED ORDER — BUPIVACAINE-EPINEPHRINE (PF) 0.5% -1:200000 IJ SOLN
INTRAMUSCULAR | Status: DC | PRN
  Administered 2022-05-20: 20 mL via PERINEURAL

## 2022-05-20 MED ORDER — LIDOCAINE HCL (PF) 2 % IJ SOLN
INTRAMUSCULAR | Status: AC
  Filled 2022-05-20: qty 5

## 2022-05-20 MED ORDER — LIDOCAINE 2% (20 MG/ML) 5 ML SYRINGE
INTRAMUSCULAR | Status: DC | PRN
  Administered 2022-05-20: 30 mg via INTRAVENOUS

## 2022-05-20 MED ORDER — DEXAMETHASONE SODIUM PHOSPHATE 10 MG/ML IJ SOLN
INTRAMUSCULAR | Status: DC | PRN
  Administered 2022-05-20: 4 mg via INTRAVENOUS

## 2022-05-20 SURGICAL SUPPLY — 41 items
BAG COUNTER SPONGE SURGICOUNT (BAG) IMPLANT
BENZOIN TINCTURE PRP APPL 2/3 (GAUZE/BANDAGES/DRESSINGS) ×1 IMPLANT
BLADE HEX COATED 2.75 (ELECTRODE) ×1 IMPLANT
BLADE SURG 15 STRL LF DISP TIS (BLADE) ×1 IMPLANT
BLADE SURG 15 STRL SS (BLADE) ×1
COVER SURGICAL LIGHT HANDLE (MISCELLANEOUS) IMPLANT
DERMABOND ADVANCED .7 DNX12 (GAUZE/BANDAGES/DRESSINGS) IMPLANT
DRAIN PENROSE 0.5X18 (DRAIN) ×1 IMPLANT
DRAPE LAPAROSCOPIC ABDOMINAL (DRAPES) ×1 IMPLANT
DRAPE UTILITY XL STRL (DRAPES) IMPLANT
ELECT REM PT RETURN 15FT ADLT (MISCELLANEOUS) ×1 IMPLANT
GAUZE SPONGE 4X4 12PLY STRL (GAUZE/BANDAGES/DRESSINGS) ×1 IMPLANT
GLOVE BIO SURGEON STRL SZ7.5 (GLOVE) ×2 IMPLANT
GLOVE BIOGEL PI IND STRL 7.0 (GLOVE) ×1 IMPLANT
GOWN STRL REUS W/ TWL LRG LVL3 (GOWN DISPOSABLE) ×1 IMPLANT
GOWN STRL REUS W/ TWL XL LVL3 (GOWN DISPOSABLE) ×1 IMPLANT
GOWN STRL REUS W/TWL LRG LVL3 (GOWN DISPOSABLE) ×2
GOWN STRL REUS W/TWL XL LVL3 (GOWN DISPOSABLE) ×1
KIT BASIN OR (CUSTOM PROCEDURE TRAY) ×1 IMPLANT
KIT TURNOVER KIT A (KITS) IMPLANT
MARKER SKIN DUAL TIP RULER LAB (MISCELLANEOUS) ×1 IMPLANT
MESH VENTRALEX ST 1-7/10 CRC S (Mesh General) IMPLANT
NDL HYPO 25X1 1.5 SAFETY (NEEDLE) ×1 IMPLANT
NEEDLE HYPO 25X1 1.5 SAFETY (NEEDLE) ×1 IMPLANT
NS IRRIG 1000ML POUR BTL (IV SOLUTION) ×1 IMPLANT
PACK BASIC VI WITH GOWN DISP (CUSTOM PROCEDURE TRAY) ×1 IMPLANT
PENCIL SMOKE EVACUATOR (MISCELLANEOUS) IMPLANT
SPIKE FLUID TRANSFER (MISCELLANEOUS) ×1 IMPLANT
STRIP CLOSURE SKIN 1/2X4 (GAUZE/BANDAGES/DRESSINGS) ×1 IMPLANT
SUT MNCRL AB 4-0 PS2 18 (SUTURE) ×1 IMPLANT
SUT PROLENE 2 0 SH DA (SUTURE) ×4 IMPLANT
SUT SILK 2 0 SH (SUTURE) IMPLANT
SUT SILK 3 0 (SUTURE) ×1
SUT SILK 3-0 18XBRD TIE 12 (SUTURE) IMPLANT
SUT VIC AB 2-0 SH 27 (SUTURE) ×2
SUT VIC AB 2-0 SH 27X BRD (SUTURE) ×2 IMPLANT
SUT VIC AB 3-0 SH 27 (SUTURE) ×2
SUT VIC AB 3-0 SH 27XBRD (SUTURE) ×2 IMPLANT
SYR BULB IRRIG 60ML STRL (SYRINGE) ×1 IMPLANT
SYR CONTROL 10ML LL (SYRINGE) ×1 IMPLANT
TOWEL OR 17X26 10 PK STRL BLUE (TOWEL DISPOSABLE) ×1 IMPLANT

## 2022-05-20 NOTE — Anesthesia Procedure Notes (Signed)
Anesthesia Regional Block: TAP block   Pre-Anesthetic Checklist: , timeout performed,  Correct Patient, Correct Site, Correct Laterality,  Correct Procedure, Correct Position, site marked,  Risks and benefits discussed,  Surgical consent,  Pre-op evaluation,  At surgeon's request and post-op pain management  Laterality: Left  Prep: chloraprep       Needles:  Injection technique: Single-shot      Needle Length: 9cm  Needle Gauge: 22     Additional Needles: Arrow StimuQuik ECHO Echogenic Stimulating PNB Needle  Procedures:,,,, ultrasound used (permanent image in chart),,    Narrative:  Start time: 05/20/2022 7:39 AM End time: 05/20/2022 7:45 AM Injection made incrementally with aspirations every 5 mL.  Performed by: Personally  Anesthesiologist: Oleta Mouse, MD

## 2022-05-20 NOTE — Anesthesia Preprocedure Evaluation (Signed)
Anesthesia Evaluation  Patient identified by MRN, date of birth, ID band Patient awake    Reviewed: Allergy & Precautions, NPO status , Patient's Chart, lab work & pertinent test results  History of Anesthesia Complications Negative for: history of anesthetic complications  Airway Mallampati: III  TM Distance: >3 FB Neck ROM: Full    Dental  (+) Teeth Intact, Dental Advisory Given   Pulmonary neg pulmonary ROS,    breath sounds clear to auscultation       Cardiovascular hypertension, Pt. on medications (-) angina(-) Past MI and (-) CHF  Rhythm:Regular     Neuro/Psych neg Seizures  Neuromuscular disease negative psych ROS   GI/Hepatic negative GI ROS, Neg liver ROS,   Endo/Other  negative endocrine ROS  Renal/GU negative Renal ROSLab Results      Component                Value               Date                      CREATININE               0.63                05/17/2022                 Musculoskeletal  (+) Arthritis ,   Abdominal   Peds  Hematology negative hematology ROS (+) Lab Results      Component                Value               Date                      WBC                      7.8                 05/17/2022                HGB                      13.0                05/17/2022                HCT                      42.1                05/17/2022                MCV                      92.5                05/17/2022                PLT                      204                 05/17/2022              Anesthesia Other Findings   Reproductive/Obstetrics  Anesthesia Physical Anesthesia Plan  ASA: 2  Anesthesia Plan: General and Regional   Post-op Pain Management: Regional block*, Tylenol PO (pre-op)*, Celebrex PO (pre-op)* and Gabapentin PO (pre-op)*   Induction: Intravenous  PONV Risk Score and Plan: 3 and Ondansetron, Dexamethasone, Propofol  infusion and TIVA  Airway Management Planned: LMA  Additional Equipment: None  Intra-op Plan:   Post-operative Plan: Extubation in OR  Informed Consent: I have reviewed the patients History and Physical, chart, labs and discussed the procedure including the risks, benefits and alternatives for the proposed anesthesia with the patient or authorized representative who has indicated his/her understanding and acceptance.     Dental advisory given  Plan Discussed with: CRNA  Anesthesia Plan Comments:         Anesthesia Quick Evaluation

## 2022-05-20 NOTE — Anesthesia Procedure Notes (Signed)
Procedure Name: Intubation Date/Time: 05/20/2022 7:42 AM  Performed by: West Pugh, CRNAPre-anesthesia Checklist: Patient identified, Emergency Drugs available, Suction available, Patient being monitored and Timeout performed Patient Re-evaluated:Patient Re-evaluated prior to induction Oxygen Delivery Method: Circle system utilized Preoxygenation: Pre-oxygenation with 100% oxygen Induction Type: IV induction Ventilation: Mask ventilation without difficulty Laryngoscope Size: Mac and 3 Grade View: Grade I Tube type: Oral Tube size: 6.5 mm Number of attempts: 1 Airway Equipment and Method: Stylet Placement Confirmation: ETT inserted through vocal cords under direct vision, positive ETCO2, CO2 detector and breath sounds checked- equal and bilateral Secured at: 21 cm Tube secured with: Tape Dental Injury: Teeth and Oropharynx as per pre-operative assessment

## 2022-05-20 NOTE — Op Note (Signed)
05/20/2022  8:39 AM  PATIENT:  Sandra Mclaughlin  88 y.o. female  PRE-OPERATIVE DIAGNOSIS:  LEFT INGUINAL HERNIA  POST-OPERATIVE DIAGNOSIS:  LEFT FEMORAL HERNIA  PROCEDURE:  Procedure(s) with comments: LEFT FEMORAL HERNIA REPAIR WITH MESH (Left) - GEN & TAP BLOCK  SURGEON:  Surgeon(s) and Role:    * Jovita Kussmaul, MD - Primary  PHYSICIAN ASSISTANT:   ASSISTANTS: none   ANESTHESIA:   local and general  EBL:  minimal   BLOOD ADMINISTERED:none  DRAINS: none   LOCAL MEDICATIONS USED:  MARCAINE     SPECIMEN:  No Specimen  DISPOSITION OF SPECIMEN:  N/A  COUNTS:  YES  TOURNIQUET:  * No tourniquets in log *  DICTATION: .Dragon Dictation  After informed consent was obtained the patient was brought to the operating room and placed in the supine position on the operating table.  After adequate induction of general anesthesia the patient's abdomen and left groin were prepped with ChloraPrep, allowed to dry, and draped in usual sterile manner.  An appropriate timeout was performed.  The left groin was then infiltrated with quarter percent Marcaine.  A small incision was made from the edge of the pubic tubercle on the left towards the anterior superior iliac spine with a 15 blade knife.  The incision was carried through the skin and subcutaneous tissue sharply with the electrocautery until the fascia of the external oblique was encountered.  There did not appear to be any fullness to the canal.  I dissected the soft tissue off the external oblique fascia and readily identified a small femoral hernia.  The hernia contained only fat.  I did open the hernia sac and excised a small portion of fat to allow it to reduce.  The peritoneum was then reclosed with a figure-of-eight 2-0 silk stitch.  The hernia sac was then reduced easily through the defect.  I then chose a small piece of umbilical patch hernia system to repair the defect.  The patch was then placed through the defect and anchored in place  with the anchors.  The anchors were sutured in place with two 2-0 Prolene stitches.  Once this was accomplished the hernia seem well repaired.  The wound was irrigated with copious amounts of saline.  The subcutaneous fascia was then reapproximated with a running 3-0 Vicryl stitch.  The skin was closed with a running 4-0 Monocryl subcuticular stitch.  Dermabond dressings were applied.  The patient tolerated the procedure well.  At the end of the case all needle sponge and instrument counts were correct.  The patient was then awakened and taken to recovery in stable condition.  PLAN OF CARE: Discharge to home after PACU  PATIENT DISPOSITION:  PACU - hemodynamically stable.   Delay start of Pharmacological VTE agent (>24hrs) due to surgical blood loss or risk of bleeding: not applicable

## 2022-05-20 NOTE — Transfer of Care (Signed)
Immediate Anesthesia Transfer of Care Note  Patient: Sandra Mclaughlin  Procedure(s) Performed: LEFT INGUINAL HERNIA REPAIR WITH MESH (Left)  Patient Location: PACU  Anesthesia Type:GA combined with regional for post-op pain  Level of Consciousness: awake, alert  and patient cooperative  Airway & Oxygen Therapy: Patient Spontanous Breathing and Patient connected to face mask oxygen  Post-op Assessment: Report given to RN and Post -op Vital signs reviewed and stable  Post vital signs: Reviewed and stable  Last Vitals:  Vitals Value Taken Time  BP    Temp    Pulse 63 05/20/22 0853  Resp 17 05/20/22 0853  SpO2 95 % 05/20/22 0853  Vitals shown include unvalidated device data.  Last Pain:  Vitals:   05/20/22 0614  TempSrc: Oral  PainSc:          Complications: No notable events documented.

## 2022-05-20 NOTE — H&P (Signed)
REFERRING PHYSICIAN: Collene Leyden, MD  PROVIDER: Landry Corporal, MD  MRN: [id number] DOB: [birth year] Subjective   Chief Complaint: New Consultation (Left Inguinal Hernia )   History of Present Illness: [facility]ler is a 88 y.o. female who is seen today as an office consultation for evaluation of New Consultation (Left Inguinal Hernia ) .   We are asked to see the patient in consultation by Dr. Collene Leyden to eval weight her for a left inguinal hernia. The patient is a 88 year old white female who recently developed some pain in the left groin. She has noticed a small bulge in that area. She denies any fevers or chills. She denies any nausea or vomiting. She was evaluated with a CT scan that did show a small fat-containing left inguinal hernia. There was no involvement of intestines and no sign of obstruction. She did have a hip fracture fixed several months ago and tolerated that well. She was on a blood thinner called Xarelto for a while but states that she is no longer taking this  Review of Systems: A complete review of systems was obtained from the patient. I have reviewed this information and discussed as appropriate with the patient. See HPI as well for other ROS.  ROS   Medical History: Past Medical History:  Diagnosis Date  Abdominal pain  Anemia  Arthritis  GERD (gastroesophageal reflux disease)   There is no problem list on file for this patient.  Past Surgical History:  Procedure Laterality Date  CHOLECYSTECTOMY  HERNIA REPAIR  Hip Surgery 2023  HYSTERECTOMY VAGINAL    Allergies  Allergen Reactions  Ciprofloxacin Diarrhea, Nausea And Vomiting and Nausea  "I had a terrible reaction, vomiting, nausea, diarrhea, muscle pain...."   Current Outpatient Medications on File Prior to Visit  Medication Sig Dispense Refill  calcium carbonate-vitamin D3 (CALTRATE 600+D) 600 mg(1,500mg ) -200 unit tablet Take 1 tablet by mouth 2 (two) times daily with meals.   alendronate (FOSAMAX) 70 MG tablet Take by mouth once a week.  esomeprazole (NEXIUM) 40 MG DR capsule Take 40 mg by mouth once daily.  FOLIC ACID/MULTIVIT-MIN/LUTEIN (CENTRUM SILVER ORAL) Take by mouth once daily.  gabapentin (NEURONTIN) 300 MG capsule Take 300 mg by mouth 2 (two) times daily  glucosamine-chondroitin 500-400 mg tablet Take by mouth 2 (two) times daily.  hydrochlorothiazide (HYDRODIURIL) 25 MG tablet Take 25 mg by mouth once daily. 3  LACTOBACILLUS ACIDOPHILUS (PROBIOTIC ORAL) Take by mouth once daily.  potassium chloride (K-DUR, KLOR-CON) 10 mEq ER tablet Take 10 mEq by mouth once daily.  timolol (TIMOPTIC-XE) 0.5 % ophthalmic gel-forming solution once daily. 12  XARELTO 15 mg tablet TAKE 1 TABLET BY MOUTH DAILY WITH SUPPER   No current facility-administered medications on file prior to visit.   History reviewed. No pertinent family history.   Social History   Tobacco Use  Smoking Status Never  Smokeless Tobacco Never    Social History   Socioeconomic History  Marital status: Married  Tobacco Use  Smoking status: Never  Smokeless tobacco: Never  Substance and Sexual Activity  Alcohol use: Not Currently  Drug use: Never   Objective:   Vitals:  BP: 139/70  Pulse: 60  Temp: 36.2 C (97.1 F)  SpO2: 97%  Weight: 46.4 kg (102 lb 6.4 oz)  Height: 152.4 cm (5')   Body mass index is 20 kg/m.  Physical Exam Vitals reviewed.  Constitutional:  General: She is not in acute distress. Appearance: Normal appearance.  HENT:  Head: Normocephalic  and atraumatic.  Right Ear: External ear normal.  Left Ear: External ear normal.  Nose: Nose normal.  Mouth/Throat:  Mouth: Mucous membranes are moist.  Pharynx: Oropharynx is clear.  Eyes:  General: No scleral icterus. Extraocular Movements: Extraocular movements intact.  Conjunctiva/sclera: Conjunctivae normal.  Pupils: Pupils are equal, round, and reactive to light.  Cardiovascular:  Rate and Rhythm:  Normal rate and regular rhythm.  Pulses: Normal pulses.  Heart sounds: Normal heart sounds.  Pulmonary:  Effort: Pulmonary effort is normal. No respiratory distress.  Breath sounds: Normal breath sounds.  Abdominal:  General: Bowel sounds are normal.  Palpations: Abdomen is soft.  Tenderness: There is no abdominal tenderness.  Genitourinary: Comments: There is a small bulge in the left groin with some mild tenderness to palpation. I do not feel like I can completely get this small bulge to reduce on exam. The rest of the abdomen is soft and nontender. There are no overlying skin changes near the bulge. Musculoskeletal:  General: No swelling, tenderness or deformity. Normal range of motion.  Cervical back: Normal range of motion and neck supple.  Skin: General: Skin is warm and dry.  Coloration: Skin is not jaundiced.  Neurological:  General: No focal deficit present.  Mental Status: She is alert and oriented to person, place, and time.  Psychiatric:  Mood and Affect: Mood normal.  Behavior: Behavior normal.    Labs, Imaging and Diagnostic Testing:  Assessment and Plan:   Diagnoses and all orders for this visit:  Non-recurrent unilateral inguinal hernia without obstruction or gangrene    The patient appears to have a small fat-containing incarcerated left inguinal hernia. Because of her pain and the likelihood that this hernia will get larger I feel she would benefit from having the hernia fixed. She would also like to have this done. I have discussed with her in detail the risks and benefits of the operation as well as some of the technical aspects including the use of mesh and the risk of chronic pain and she understands and wishes to proceed. I will likely keep her overnight given her age and have physical therapy work with her to make sure she is stable enough to go home

## 2022-05-20 NOTE — Interval H&P Note (Signed)
History and Physical Interval Note:  05/20/2022 7:21 AM  Sandra Mclaughlin  has presented today for surgery, with the diagnosis of LEFT INGUINAL HERNIA.  The various methods of treatment have been discussed with the patient and family. After consideration of risks, benefits and other options for treatment, the patient has consented to  Procedure(s) with comments: LEFT INGUINAL HERNIA REPAIR WITH MESH (Left) - GEN & TAP BLOCK as a surgical intervention.  The patient's history has been reviewed, patient examined, no change in status, stable for surgery.  I have reviewed the patient's chart and labs.  Questions were answered to the patient's satisfaction.     Autumn Messing III

## 2022-05-21 ENCOUNTER — Encounter (HOSPITAL_COMMUNITY): Payer: Self-pay | Admitting: General Surgery

## 2022-05-21 NOTE — Anesthesia Postprocedure Evaluation (Signed)
Anesthesia Post Note  Patient: Sandra Mclaughlin  Procedure(s) Performed: LEFT INGUINAL HERNIA REPAIR WITH MESH (Left)     Patient location during evaluation: PACU Anesthesia Type: Regional and General Level of consciousness: awake and patient cooperative Pain management: pain level controlled Vital Signs Assessment: post-procedure vital signs reviewed and stable Respiratory status: spontaneous breathing, nonlabored ventilation and respiratory function stable Cardiovascular status: blood pressure returned to baseline and stable Postop Assessment: no apparent nausea or vomiting Anesthetic complications: no   No notable events documented.  Last Vitals:  Vitals:   05/20/22 0915 05/20/22 0941  BP: 135/66 (!) 141/79  Pulse: (!) 58 62  Resp: 15 16  Temp:  36.4 C  SpO2: 93% 96%    Last Pain:  Vitals:   05/20/22 0941  TempSrc: Oral  PainSc: 0-No pain                 Jacobi Nile

## 2022-06-17 DIAGNOSIS — H401131 Primary open-angle glaucoma, bilateral, mild stage: Secondary | ICD-10-CM | POA: Diagnosis not present

## 2022-06-28 DIAGNOSIS — M25561 Pain in right knee: Secondary | ICD-10-CM | POA: Diagnosis not present

## 2022-06-28 DIAGNOSIS — M25562 Pain in left knee: Secondary | ICD-10-CM | POA: Diagnosis not present

## 2022-07-22 DIAGNOSIS — M25512 Pain in left shoulder: Secondary | ICD-10-CM | POA: Diagnosis not present

## 2022-07-22 DIAGNOSIS — M25552 Pain in left hip: Secondary | ICD-10-CM | POA: Diagnosis not present

## 2022-07-28 DIAGNOSIS — S72142D Displaced intertrochanteric fracture of left femur, subsequent encounter for closed fracture with routine healing: Secondary | ICD-10-CM | POA: Diagnosis not present

## 2022-07-28 DIAGNOSIS — S42032A Displaced fracture of lateral end of left clavicle, initial encounter for closed fracture: Secondary | ICD-10-CM | POA: Diagnosis not present

## 2022-08-04 DIAGNOSIS — X32XXXA Exposure to sunlight, initial encounter: Secondary | ICD-10-CM | POA: Diagnosis not present

## 2022-08-04 DIAGNOSIS — L570 Actinic keratosis: Secondary | ICD-10-CM | POA: Diagnosis not present

## 2022-08-04 DIAGNOSIS — D225 Melanocytic nevi of trunk: Secondary | ICD-10-CM | POA: Diagnosis not present

## 2022-08-04 DIAGNOSIS — L718 Other rosacea: Secondary | ICD-10-CM | POA: Diagnosis not present

## 2022-08-18 DIAGNOSIS — L570 Actinic keratosis: Secondary | ICD-10-CM | POA: Diagnosis not present

## 2022-08-18 DIAGNOSIS — X32XXXD Exposure to sunlight, subsequent encounter: Secondary | ICD-10-CM | POA: Diagnosis not present

## 2022-08-24 ENCOUNTER — Emergency Department (HOSPITAL_COMMUNITY)
Admission: EM | Admit: 2022-08-24 | Discharge: 2022-08-25 | Disposition: A | Discharge status: Home / Self Care | Payer: Medicare Other | Attending: Emergency Medicine | Admitting: Emergency Medicine

## 2022-08-24 ENCOUNTER — Emergency Department (HOSPITAL_COMMUNITY): Payer: Medicare Other

## 2022-08-24 ENCOUNTER — Encounter (HOSPITAL_COMMUNITY): Payer: Self-pay | Admitting: Emergency Medicine

## 2022-08-24 DIAGNOSIS — S2222XA Fracture of body of sternum, initial encounter for closed fracture: Secondary | ICD-10-CM | POA: Insufficient documentation

## 2022-08-24 DIAGNOSIS — Y92410 Unspecified street and highway as the place of occurrence of the external cause: Secondary | ICD-10-CM | POA: Diagnosis not present

## 2022-08-24 DIAGNOSIS — K5730 Diverticulosis of large intestine without perforation or abscess without bleeding: Secondary | ICD-10-CM | POA: Insufficient documentation

## 2022-08-24 DIAGNOSIS — I700 Atherosclerosis of aorta: Secondary | ICD-10-CM | POA: Diagnosis not present

## 2022-08-24 DIAGNOSIS — R079 Chest pain, unspecified: Secondary | ICD-10-CM | POA: Diagnosis not present

## 2022-08-24 DIAGNOSIS — D72829 Elevated white blood cell count, unspecified: Secondary | ICD-10-CM | POA: Insufficient documentation

## 2022-08-24 DIAGNOSIS — S2220XA Unspecified fracture of sternum, initial encounter for closed fracture: Secondary | ICD-10-CM | POA: Diagnosis not present

## 2022-08-24 DIAGNOSIS — R911 Solitary pulmonary nodule: Secondary | ICD-10-CM

## 2022-08-24 DIAGNOSIS — S0990XA Unspecified injury of head, initial encounter: Secondary | ICD-10-CM | POA: Diagnosis not present

## 2022-08-24 DIAGNOSIS — R918 Other nonspecific abnormal finding of lung field: Secondary | ICD-10-CM | POA: Insufficient documentation

## 2022-08-24 DIAGNOSIS — R072 Precordial pain: Secondary | ICD-10-CM | POA: Diagnosis not present

## 2022-08-24 DIAGNOSIS — R0789 Other chest pain: Secondary | ICD-10-CM | POA: Diagnosis not present

## 2022-08-24 DIAGNOSIS — S199XXA Unspecified injury of neck, initial encounter: Secondary | ICD-10-CM | POA: Diagnosis not present

## 2022-08-24 DIAGNOSIS — R7989 Other specified abnormal findings of blood chemistry: Secondary | ICD-10-CM | POA: Diagnosis not present

## 2022-08-24 DIAGNOSIS — R0602 Shortness of breath: Secondary | ICD-10-CM | POA: Insufficient documentation

## 2022-08-24 LAB — CBC
HCT: 40.1 % (ref 36.0–46.0)
Hemoglobin: 13.1 g/dL (ref 12.0–15.0)
MCH: 29.9 pg (ref 26.0–34.0)
MCHC: 32.7 g/dL (ref 30.0–36.0)
MCV: 91.6 fL (ref 80.0–100.0)
Platelets: 186 10*3/uL (ref 150–400)
RBC: 4.38 MIL/uL (ref 3.87–5.11)
RDW: 14.1 % (ref 11.5–15.5)
WBC: 15.5 10*3/uL — ABNORMAL HIGH (ref 4.0–10.5)
nRBC: 0 % (ref 0.0–0.2)

## 2022-08-24 LAB — BASIC METABOLIC PANEL
Anion gap: 7 (ref 5–15)
BUN: 14 mg/dL (ref 8–23)
CO2: 27 mmol/L (ref 22–32)
Calcium: 9.1 mg/dL (ref 8.9–10.3)
Chloride: 99 mmol/L (ref 98–111)
Creatinine, Ser: 0.67 mg/dL (ref 0.44–1.00)
GFR, Estimated: >60 mL/min (ref >60)
Glucose, Bld: 129 mg/dL — ABNORMAL HIGH (ref 70–99)
Potassium: 4.2 mmol/L (ref 3.5–5.1)
Sodium: 133 mmol/L — ABNORMAL LOW (ref 135–145)

## 2022-08-24 LAB — TROPONIN I (HIGH SENSITIVITY): Troponin I (High Sensitivity): 18 ng/L — ABNORMAL HIGH (ref <18)
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
## 2022-08-25 ENCOUNTER — Emergency Department (HOSPITAL_COMMUNITY): Payer: Medicare Other

## 2022-08-25 ENCOUNTER — Other Ambulatory Visit: Payer: Self-pay

## 2022-08-25 DIAGNOSIS — S199XXA Unspecified injury of neck, initial encounter: Secondary | ICD-10-CM | POA: Diagnosis not present

## 2022-08-25 DIAGNOSIS — S2222XA Fracture of body of sternum, initial encounter for closed fracture: Secondary | ICD-10-CM | POA: Diagnosis not present

## 2022-08-25 DIAGNOSIS — S0990XA Unspecified injury of head, initial encounter: Secondary | ICD-10-CM | POA: Diagnosis not present

## 2022-08-25 DIAGNOSIS — K5730 Diverticulosis of large intestine without perforation or abscess without bleeding: Secondary | ICD-10-CM | POA: Diagnosis not present

## 2022-08-25 LAB — TROPONIN I (HIGH SENSITIVITY): Troponin I (High Sensitivity): 25 ng/L — ABNORMAL HIGH (ref <18)

## 2022-08-25 MED ORDER — HYDROCODONE-ACETAMINOPHEN 5-325 MG PO TABS: 1.0000 | ORAL_TABLET | Freq: Four times a day (QID) | ORAL | 0 refills | Status: DC | PRN

## 2022-08-25 MED ORDER — IOHEXOL 350 MG/ML SOLN
75.0000 mL | Freq: Once | INTRAVENOUS | Status: AC | PRN
  Administered 2022-08-25: 75 mL via INTRAVENOUS

## 2022-08-25 MED ORDER — FENTANYL CITRATE PF 50 MCG/ML IJ SOSY
50.0000 ug | PREFILLED_SYRINGE | Freq: Once | INTRAMUSCULAR | Status: AC
  Administered 2022-08-25: 50 ug via INTRAVENOUS
  Filled 2022-08-25: qty 1

## 2022-08-25 NOTE — ED Provider Notes (Signed)
Patient found to have isolated sternal fracture.  She has been ambulatory.  No distress.  No weakness or dizziness.  EKG does not reveal any dysrhythmia.  Patient feels comfortable for discharge home.  She is no longer on anticoagulation.  She will follow-up with her PCP in a week for recheck.  Short course of pain medicine will be given.  Also informed of pulmonary nodules, will need to have CT scan in 6-12 months.  Patient denies history of smoking and very low risk for malignancy  Discussed with patient, son and husband.  All questions answered.  She is safe for discharge   Ripley Fraise, MD 08/25/22 [id number]
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
## 2022-08-30 DIAGNOSIS — S2220XA Unspecified fracture of sternum, initial encounter for closed fracture: Secondary | ICD-10-CM | POA: Diagnosis not present

## 2022-08-30 DIAGNOSIS — R918 Other nonspecific abnormal finding of lung field: Secondary | ICD-10-CM | POA: Diagnosis not present

## 2022-09-15 DIAGNOSIS — S2222XA Fracture of body of sternum, initial encounter for closed fracture: Secondary | ICD-10-CM | POA: Diagnosis not present

## 2022-09-15 DIAGNOSIS — S42002A Fracture of unspecified part of left clavicle, initial encounter for closed fracture: Secondary | ICD-10-CM | POA: Diagnosis not present

## 2022-09-27 DIAGNOSIS — K219 Gastro-esophageal reflux disease without esophagitis: Secondary | ICD-10-CM | POA: Diagnosis not present

## 2022-09-27 DIAGNOSIS — I10 Essential (primary) hypertension: Secondary | ICD-10-CM | POA: Diagnosis not present

## 2022-09-27 DIAGNOSIS — M810 Age-related osteoporosis without current pathological fracture: Secondary | ICD-10-CM | POA: Diagnosis not present

## 2022-09-27 DIAGNOSIS — I700 Atherosclerosis of aorta: Secondary | ICD-10-CM | POA: Diagnosis not present

## 2022-12-15 DIAGNOSIS — X32XXXD Exposure to sunlight, subsequent encounter: Secondary | ICD-10-CM | POA: Diagnosis not present

## 2022-12-15 DIAGNOSIS — D045 Carcinoma in situ of skin of trunk: Secondary | ICD-10-CM | POA: Diagnosis not present

## 2022-12-15 DIAGNOSIS — L570 Actinic keratosis: Secondary | ICD-10-CM | POA: Diagnosis not present

## 2023-01-05 DIAGNOSIS — H401131 Primary open-angle glaucoma, bilateral, mild stage: Secondary | ICD-10-CM | POA: Diagnosis not present

## 2023-01-17 DIAGNOSIS — L723 Sebaceous cyst: Secondary | ICD-10-CM | POA: Diagnosis not present

## 2023-01-19 DIAGNOSIS — Z85828 Personal history of other malignant neoplasm of skin: Secondary | ICD-10-CM | POA: Diagnosis not present

## 2023-01-19 DIAGNOSIS — Z08 Encounter for follow-up examination after completed treatment for malignant neoplasm: Secondary | ICD-10-CM | POA: Diagnosis not present

## 2023-01-31 DIAGNOSIS — M170 Bilateral primary osteoarthritis of knee: Secondary | ICD-10-CM | POA: Diagnosis not present

## 2023-02-02 DIAGNOSIS — R8271 Bacteriuria: Secondary | ICD-10-CM | POA: Diagnosis not present

## 2023-02-02 DIAGNOSIS — N390 Urinary tract infection, site not specified: Secondary | ICD-10-CM | POA: Diagnosis not present

## 2023-02-02 DIAGNOSIS — R350 Frequency of micturition: Secondary | ICD-10-CM | POA: Diagnosis not present

## 2023-03-09 DIAGNOSIS — Z0000 Encounter for general adult medical examination without abnormal findings: Secondary | ICD-10-CM | POA: Diagnosis not present

## 2023-03-09 DIAGNOSIS — I700 Atherosclerosis of aorta: Secondary | ICD-10-CM | POA: Diagnosis not present

## 2023-03-09 DIAGNOSIS — G629 Polyneuropathy, unspecified: Secondary | ICD-10-CM | POA: Diagnosis not present

## 2023-03-09 DIAGNOSIS — M810 Age-related osteoporosis without current pathological fracture: Secondary | ICD-10-CM | POA: Diagnosis not present

## 2023-03-09 DIAGNOSIS — I10 Essential (primary) hypertension: Secondary | ICD-10-CM | POA: Diagnosis not present

## 2023-03-09 DIAGNOSIS — K219 Gastro-esophageal reflux disease without esophagitis: Secondary | ICD-10-CM | POA: Diagnosis not present

## 2023-03-28 DIAGNOSIS — K080 Exfoliation of teeth due to systemic causes: Secondary | ICD-10-CM | POA: Diagnosis not present

## 2023-03-30 DIAGNOSIS — D72829 Elevated white blood cell count, unspecified: Secondary | ICD-10-CM | POA: Diagnosis not present

## 2023-04-04 DIAGNOSIS — Z1231 Encounter for screening mammogram for malignant neoplasm of breast: Secondary | ICD-10-CM | POA: Diagnosis not present

## 2023-04-13 DIAGNOSIS — K080 Exfoliation of teeth due to systemic causes: Secondary | ICD-10-CM | POA: Diagnosis not present

## 2023-04-15 DIAGNOSIS — R350 Frequency of micturition: Secondary | ICD-10-CM | POA: Diagnosis not present

## 2023-04-15 DIAGNOSIS — N390 Urinary tract infection, site not specified: Secondary | ICD-10-CM | POA: Diagnosis not present

## 2023-04-25 ENCOUNTER — Inpatient Hospital Stay: Payer: Medicare Other

## 2023-04-25 ENCOUNTER — Inpatient Hospital Stay: Payer: Medicare Other | Attending: Hematology and Oncology | Admitting: Hematology and Oncology

## 2023-04-25 ENCOUNTER — Encounter: Payer: Self-pay | Admitting: Hematology and Oncology

## 2023-04-25 VITALS — BP 160/68 | HR 69 | Temp 98.1°F | Resp 18 | Ht 59.0 in | Wt 111.6 lb

## 2023-04-25 DIAGNOSIS — Z79899 Other long term (current) drug therapy: Secondary | ICD-10-CM | POA: Insufficient documentation

## 2023-04-25 DIAGNOSIS — D72829 Elevated white blood cell count, unspecified: Secondary | ICD-10-CM | POA: Diagnosis not present

## 2023-04-25 DIAGNOSIS — D649 Anemia, unspecified: Secondary | ICD-10-CM | POA: Diagnosis not present

## 2023-04-25 DIAGNOSIS — G609 Hereditary and idiopathic neuropathy, unspecified: Secondary | ICD-10-CM | POA: Insufficient documentation

## 2023-04-25 DIAGNOSIS — Z87440 Personal history of urinary (tract) infections: Secondary | ICD-10-CM | POA: Insufficient documentation

## 2023-04-25 DIAGNOSIS — D539 Nutritional anemia, unspecified: Secondary | ICD-10-CM

## 2023-04-25 DIAGNOSIS — D472 Monoclonal gammopathy: Secondary | ICD-10-CM | POA: Insufficient documentation

## 2023-04-25 HISTORY — DX: Monoclonal gammopathy: D47.2
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
## 2023-04-25 NOTE — Assessment & Plan Note (Signed)
She has intermittent chronic leukocytosis, could be related to her chronic bladder situation She is not symptomatic

## 2023-04-25 NOTE — Assessment & Plan Note (Signed)
Likely due to anemia chronic disease I will order additional workup

## 2023-04-25 NOTE — Assessment & Plan Note (Signed)
The patient has no signs of renal failure or hypercalcemia Her anemia is unrelated, likely due to anemia of chronic disease Her CT imaging done earlier this year was reviewed which showed no evidence of lytic lesion I suspect she has MGUS only I will proceed to order additional labs including 24-hour urine collection and skeletal survey for assessment

## 2023-04-25 NOTE — Assessment & Plan Note (Signed)
Could be related to MGUS I will order additional workup and B12 level

## 2023-04-26 ENCOUNTER — Telehealth: Payer: Self-pay

## 2023-04-26 NOTE — Telephone Encounter (Signed)
Called back and scheduled appt with Dr. Bertis Ruddy on 10/14 at 10 am, she is aware of appt.

## 2023-04-26 NOTE — Telephone Encounter (Signed)
Called and given appts on 9/25, she is aware of appt times.

## 2023-05-04 ENCOUNTER — Other Ambulatory Visit: Payer: Self-pay

## 2023-05-04 ENCOUNTER — Ambulatory Visit (HOSPITAL_COMMUNITY)
Admission: RE | Admit: 2023-05-04 | Discharge: 2023-05-04 | Disposition: A | Discharge status: Home / Self Care | Payer: Medicare Other | Source: Ambulatory Visit | Attending: Hematology and Oncology | Admitting: Hematology and Oncology

## 2023-05-04 ENCOUNTER — Inpatient Hospital Stay: Payer: Medicare Other

## 2023-05-04 DIAGNOSIS — D539 Nutritional anemia, unspecified: Secondary | ICD-10-CM

## 2023-05-04 DIAGNOSIS — D472 Monoclonal gammopathy: Secondary | ICD-10-CM

## 2023-05-04 DIAGNOSIS — M47812 Spondylosis without myelopathy or radiculopathy, cervical region: Secondary | ICD-10-CM | POA: Diagnosis not present

## 2023-05-04 DIAGNOSIS — M170 Bilateral primary osteoarthritis of knee: Secondary | ICD-10-CM | POA: Diagnosis not present

## 2023-05-04 LAB — CBC WITH DIFFERENTIAL/PLATELET
Abs Immature Granulocytes: 0.04 10*3/uL (ref 0.00–0.07)
Basophils Absolute: 0 10*3/uL (ref 0.0–0.1)
Basophils Relative: 0 %
Eosinophils Absolute: 0.2 10*3/uL (ref 0.0–0.5)
Eosinophils Relative: 3 %
HCT: 36.7 % (ref 36.0–46.0)
Hemoglobin: 11.9 g/dL — ABNORMAL LOW (ref 12.0–15.0)
Immature Granulocytes: 1 %
Lymphocytes Relative: 22 %
Lymphs Abs: 1.9 10*3/uL (ref 0.7–4.0)
MCH: 29.8 pg (ref 26.0–34.0)
MCHC: 32.4 g/dL (ref 30.0–36.0)
MCV: 92 fL (ref 80.0–100.0)
Monocytes Absolute: 0.7 10*3/uL (ref 0.1–1.0)
Monocytes Relative: 8 %
Neutro Abs: 5.5 10*3/uL (ref 1.7–7.7)
Neutrophils Relative %: 66 %
Platelets: 198 10*3/uL (ref 150–400)
RBC: 3.99 MIL/uL (ref 3.87–5.11)
RDW: 13.3 % (ref 11.5–15.5)
WBC: 8.3 10*3/uL (ref 4.0–10.5)
nRBC: 0 % (ref 0.0–0.2)

## 2023-05-04 LAB — CMP (CANCER CENTER ONLY)
ALT: 10 U/L (ref 0–44)
AST: 15 U/L (ref 15–41)
Albumin: 3.6 g/dL (ref 3.5–5.0)
Alkaline Phosphatase: 67 U/L (ref 38–126)
Anion gap: 5 (ref 5–15)
BUN: 9 mg/dL (ref 8–23)
CO2: 30 mmol/L (ref 22–32)
Calcium: 9.3 mg/dL (ref 8.9–10.3)
Chloride: 103 mmol/L (ref 98–111)
Creatinine: 0.69 mg/dL (ref 0.44–1.00)
GFR, Estimated: >60 mL/min
Glucose, Bld: 87 mg/dL (ref 70–99)
Potassium: 4.1 mmol/L (ref 3.5–5.1)
Sodium: 138 mmol/L (ref 135–145)
Total Bilirubin: 0.5 mg/dL (ref 0.3–1.2)
Total Protein: 6.5 g/dL (ref 6.5–8.1)

## 2023-05-04 LAB — IRON AND IRON BINDING CAPACITY (CC-WL,HP ONLY)
Iron: 41 ug/dL (ref 28–170)
Saturation Ratios: 18 % (ref 10.4–31.8)
TIBC: 223 ug/dL — ABNORMAL LOW (ref 250–450)
UIBC: 182 ug/dL (ref 148–442)

## 2023-05-04 LAB — VITAMIN D 25 HYDROXY (VIT D DEFICIENCY, FRACTURES): Vit D, 25-Hydroxy: 79.9 ng/mL (ref 30–100)

## 2023-05-04 LAB — LACTATE DEHYDROGENASE: LDH: 116 U/L (ref 98–192)

## 2023-05-04 LAB — FERRITIN: Ferritin: 152 ng/mL (ref 11–307)

## 2023-05-06 LAB — KAPPA/LAMBDA LIGHT CHAINS
Kappa free light chain: 25.6 mg/L — ABNORMAL HIGH (ref 3.3–19.4)
Kappa, lambda light chain ratio: 0.75 (ref 0.26–1.65)
Lambda free light chains: 34.3 mg/L — ABNORMAL HIGH (ref 5.7–26.3)

## 2023-05-06 LAB — BETA 2 MICROGLOBULIN, SERUM: Beta-2 Microglobulin: 2.8 mg/L — ABNORMAL HIGH (ref 0.6–2.4)

## 2023-05-08 LAB — MULTIPLE MYELOMA PANEL, SERUM
Albumin SerPl Elph-Mcnc: 3.3 g/dL (ref 2.9–4.4)
Albumin/Glob SerPl: 1.2 (ref 0.7–1.7)
Alpha 1: 0.3 g/dL (ref 0.0–0.4)
Alpha2 Glob SerPl Elph-Mcnc: 0.6 g/dL (ref 0.4–1.0)
B-Globulin SerPl Elph-Mcnc: 0.9 g/dL (ref 0.7–1.3)
Gamma Glob SerPl Elph-Mcnc: 1.1 g/dL (ref 0.4–1.8)
Globulin, Total: 2.8 g/dL (ref 2.2–3.9)
IgA: 354 mg/dL (ref 64–422)
IgG (Immunoglobin G), Serum: 1021 mg/dL (ref 586–1602)
IgM (Immunoglobulin M), Srm: 144 mg/dL (ref 26–217)
M Protein SerPl Elph-Mcnc: 0.7 g/dL — ABNORMAL HIGH
Total Protein ELP: 6.1 g/dL (ref 6.0–8.5)

## 2023-05-09 LAB — UPEP/UIFE/LIGHT CHAINS/TP, 24-HR UR
% BETA, Urine: 0 %
ALPHA 1 URINE: 0 %
Albumin, U: 100 %
Alpha 2, Urine: 0 %
Free Kappa Lt Chains,Ur: 3.86 mg/L (ref 1.17–86.46)
Free Kappa/Lambda Ratio: 3.54 (ref 1.83–14.26)
Free Lambda Lt Chains,Ur: 1.09 mg/L (ref 0.27–15.21)
GAMMA GLOBULIN URINE: 0 %
Total Protein, Urine-Ur/day: <106 mg/(24.h) (ref 30–150)
Total Protein, Urine: <4 mg/dL
Total Volume: 2650

## 2023-05-23 ENCOUNTER — Encounter: Payer: Self-pay | Admitting: Hematology and Oncology

## 2023-05-23 ENCOUNTER — Inpatient Hospital Stay: Payer: Medicare Other | Attending: Hematology and Oncology | Admitting: Hematology and Oncology

## 2023-05-23 VITALS — BP 155/95 | HR 59 | Temp 98.6°F | Resp 18 | Ht 59.0 in | Wt 112.2 lb

## 2023-05-23 DIAGNOSIS — Z79899 Other long term (current) drug therapy: Secondary | ICD-10-CM | POA: Diagnosis not present

## 2023-05-23 DIAGNOSIS — D649 Anemia, unspecified: Secondary | ICD-10-CM | POA: Diagnosis not present

## 2023-05-23 DIAGNOSIS — D472 Monoclonal gammopathy: Secondary | ICD-10-CM | POA: Diagnosis not present

## 2023-05-23 NOTE — Assessment & Plan Note (Signed)
I have reviewed all test results with the patient and her husband Overall, she have IgG MGUS but asymptomatic She has very minimal anemia, could be related to her age No signs of bone lesions, hypercalcemia or renal dysfunction I recommend surveillance once a year

## 2023-05-23 NOTE — Progress Notes (Signed)
[facility] OFFICE PROGRESS NOTE  Patient Care Team: Irven Coe, MD as PCP - General (Family Medicine)  ASSESSMENT & PLAN:  MGUS (monoclonal gammopathy of unknown significance) I have reviewed all test results with the patient and her husband Overall, she have IgG MGUS but asymptomatic She has very minimal anemia, could be related to her age No signs of bone lesions, hypercalcemia or renal dysfunction I recommend surveillance once a year  Anemia She has borderline anemia Iron studies are adequate I suspect this is anemia related to her age Observe only  Orders Placed This Encounter  Procedures   CBC with Differential (Cancer Center Only)    Standing Status:   Future    Standing Expiration Date:   05/22/2024   CMP (Cancer Center only)    Standing Status:   Future    Standing Expiration Date:   05/22/2024   Kappa/lambda light chains    Standing Status:   Standing    Number of Occurrences:   22    Standing Expiration Date:   05/22/2024   Multiple Myeloma Panel (SPEP&IFE w/QIG)    Standing Status:   Standing    Number of Occurrences:   22    Standing Expiration Date:   05/22/2024    All questions were answered. The patient knows to call the clinic with any problems, questions or concerns. The total time spent in the appointment was 20 minutes encounter with patients including review of chart and various tests results, discussions about plan of care and coordination of care plan   Artis Delay, MD 05/23/2023 10:39 AM  INTERVAL HISTORY: Please see below for problem oriented charting. she returns for review of test results.  She is here accompanied by her husband. I reviewed results of her blood work and imaging studies and discussed future follow-up  REVIEW OF SYSTEMS:   Constitutional: Denies fevers, chills or abnormal weight loss Eyes: Denies blurriness of vision Ears, nose, mouth, throat, and face: Denies mucositis or sore throat Respiratory: Denies  cough, dyspnea or wheezes Cardiovascular: Denies palpitation, chest discomfort or lower extremity swelling Gastrointestinal:  Denies nausea, heartburn or change in bowel habits Skin: Denies abnormal skin rashes Lymphatics: Denies new lymphadenopathy or easy bruising Neurological:Denies numbness, tingling or new weaknesses Behavioral/Psych: Mood is stable, no new changes  All other systems were reviewed with the patient and are negative.  I have reviewed the past medical history, past surgical history, social history and family history with the patient and they are unchanged from previous note.  ALLERGIES:  is allergic to ciprofloxacin.  MEDICATIONS:  Current Outpatient Medications  Medication Sig Dispense Refill   acetaminophen (TYLENOL) 500 MG tablet Take 500 mg by mouth every 6 (six) hours as needed for moderate pain.     Calcium Carb-Cholecalciferol (CALTRATE 600+D3 PO) Take 1 tablet by mouth 2 (two) times daily.     Cholecalciferol (VITAMIN D-3) 25 MCG (1000 UT) CAPS Take 1,000 Units by mouth daily.     cyanocobalamin (VITAMIN B12) 1000 MCG tablet Take 1,000 mcg by mouth daily.     dorzolamide-timolol (COSOPT) 22.3-6.8 MG/ML ophthalmic solution Place 1 drop into both eyes 2 (two) times daily.     gabapentin (NEURONTIN) 300 MG capsule Take 1 capsule (300 mg total) by mouth 2 (two) times daily. 180 capsule 1   HYDROcodone-acetaminophen (NORCO/VICODIN) 5-325 MG tablet Take 1 tablet by mouth every 6 (six) hours as needed for severe pain. 10 tablet 0   lisinopril (ZESTRIL) 20 MG tablet Take  20 mg by mouth daily.     Multiple Vitamins-Minerals (CENTRUM SILVER PO) Take 1 tablet by mouth daily with breakfast.     OVER THE COUNTER MEDICATION Take 1 capsule by mouth daily. Instaflex otc supplement     vitamin C (ASCORBIC ACID) 500 MG tablet Take 500 mg by mouth daily.     No current facility-administered medications for this visit.    SUMMARY OF ONCOLOGIC HISTORY: Oncology History   No  history exists.    PHYSICAL EXAMINATION: ECOG PERFORMANCE STATUS: 1 - Symptomatic but completely ambulatory  Vitals:   05/23/23 1011  BP: (!) 155/95  Pulse: (!) 59  Resp: 18  Temp: 98.6 F (37 C)  SpO2: 96%   Filed Weights   05/23/23 1011  Weight: 112 lb 3.2 oz (50.9 kg)    GENERAL:alert, no distress and comfortable  LABORATORY DATA:  I have reviewed the data as listed    Component Value Date/Time   NA 138 05/04/2023 1313   K 4.1 05/04/2023 1313   CL 103 05/04/2023 1313   CO2 30 05/04/2023 1313   GLUCOSE 87 05/04/2023 1313   BUN 9 05/04/2023 1313   CREATININE 0.69 05/04/2023 1313   CREATININE 0.64 05/14/2021 0945   CALCIUM 9.3 05/04/2023 1313   PROT 6.5 05/04/2023 1313   ALBUMIN 3.6 05/04/2023 1313   AST 15 05/04/2023 1313   ALT 10 05/04/2023 1313   ALKPHOS 67 05/04/2023 1313   BILITOT 0.5 05/04/2023 1313   GFRNONAA >60 05/04/2023 1313   GFRAA 52 (L) 08/16/2015 1052    No results found for: "SPEP", "UPEP"  Lab Results  Component Value Date   WBC 8.3 05/04/2023   NEUTROABS 5.5 05/04/2023   HGB 11.9 (L) 05/04/2023   HCT 36.7 05/04/2023   MCV 92.0 05/04/2023   PLT 198 05/04/2023      Chemistry      Component Value Date/Time   NA 138 05/04/2023 1313   K 4.1 05/04/2023 1313   CL 103 05/04/2023 1313   CO2 30 05/04/2023 1313   BUN 9 05/04/2023 1313   CREATININE 0.69 05/04/2023 1313   CREATININE 0.64 05/14/2021 0945      Component Value Date/Time   CALCIUM 9.3 05/04/2023 1313   ALKPHOS 67 05/04/2023 1313   AST 15 05/04/2023 1313   ALT 10 05/04/2023 1313   BILITOT 0.5 05/04/2023 1313       RADIOGRAPHIC STUDIES: I have personally reviewed the radiological images as listed and agreed with the findings in the report. DG Bone Survey Met  Result Date: 05/20/2023 CLINICAL DATA:  Monoclonal gammopathy of unknown significance. EXAM: METASTATIC BONE SURVEY COMPARISON:  CT head 08/25/2022 and CT chest, abdomen, pelvis 08/25/2022 FINDINGS: Evidence for  an old fracture involving the lateral left clavicle. No suspicious sclerotic or lucent bone lesions. Significant joint space narrowing at the left radiocarpal joint. There is also widening of the left scapholunate interval. Evidence for an old fracture involving the distal right ulna. Multilevel degenerative changes in the cervical spine. Patient has a spinal stimulator that extends into the upper thoracic spine. Lungs are clear without acute chest findings. Again noted are prominent right hilar vascular structures. Old fracture in the proximal left humerus with a dynamic hip screw. Degenerative changes in both knees, left side more severe than right. IMPRESSION: 1. No suspicious sclerotic or lucent bone lesions. 2. Scattered degenerative and old traumatic changes. Electronically Signed   By: Richarda Overlie M.D.   On: 05/20/2023 15:10

## 2023-05-23 NOTE — Assessment & Plan Note (Signed)
She has borderline anemia Iron studies are adequate I suspect this is anemia related to her age Observe only

## 2023-07-04 DIAGNOSIS — Z961 Presence of intraocular lens: Secondary | ICD-10-CM | POA: Diagnosis not present

## 2023-07-04 DIAGNOSIS — H04123 Dry eye syndrome of bilateral lacrimal glands: Secondary | ICD-10-CM | POA: Diagnosis not present

## 2023-07-13 DIAGNOSIS — Z85828 Personal history of other malignant neoplasm of skin: Secondary | ICD-10-CM | POA: Diagnosis not present

## 2023-07-13 DIAGNOSIS — L820 Inflamed seborrheic keratosis: Secondary | ICD-10-CM | POA: Diagnosis not present

## 2023-07-13 DIAGNOSIS — Z08 Encounter for follow-up examination after completed treatment for malignant neoplasm: Secondary | ICD-10-CM | POA: Diagnosis not present

## 2023-09-21 DIAGNOSIS — R350 Frequency of micturition: Secondary | ICD-10-CM | POA: Diagnosis not present

## 2023-09-21 DIAGNOSIS — R8271 Bacteriuria: Secondary | ICD-10-CM | POA: Diagnosis not present

## 2023-09-26 DIAGNOSIS — I10 Essential (primary) hypertension: Secondary | ICD-10-CM | POA: Diagnosis not present

## 2023-09-26 DIAGNOSIS — M810 Age-related osteoporosis without current pathological fracture: Secondary | ICD-10-CM | POA: Diagnosis not present

## 2023-09-26 DIAGNOSIS — R809 Proteinuria, unspecified: Secondary | ICD-10-CM | POA: Diagnosis not present

## 2023-10-10 DIAGNOSIS — K080 Exfoliation of teeth due to systemic causes: Secondary | ICD-10-CM | POA: Diagnosis not present

## 2023-11-21 DIAGNOSIS — M810 Age-related osteoporosis without current pathological fracture: Secondary | ICD-10-CM | POA: Diagnosis not present

## 2023-11-21 DIAGNOSIS — R918 Other nonspecific abnormal finding of lung field: Secondary | ICD-10-CM | POA: Diagnosis not present

## 2023-11-21 DIAGNOSIS — I10 Essential (primary) hypertension: Secondary | ICD-10-CM | POA: Diagnosis not present

## 2023-11-21 DIAGNOSIS — G629 Polyneuropathy, unspecified: Secondary | ICD-10-CM | POA: Diagnosis not present

## 2023-12-21 DIAGNOSIS — R011 Cardiac murmur, unspecified: Secondary | ICD-10-CM | POA: Diagnosis not present

## 2023-12-26 DIAGNOSIS — K080 Exfoliation of teeth due to systemic causes: Secondary | ICD-10-CM | POA: Diagnosis not present

## 2024-01-04 DIAGNOSIS — H401131 Primary open-angle glaucoma, bilateral, mild stage: Secondary | ICD-10-CM | POA: Diagnosis not present

## 2024-01-09 DIAGNOSIS — K080 Exfoliation of teeth due to systemic causes: Secondary | ICD-10-CM | POA: Diagnosis not present

## 2024-01-23 ENCOUNTER — Emergency Department (HOSPITAL_BASED_OUTPATIENT_CLINIC_OR_DEPARTMENT_OTHER)

## 2024-01-23 ENCOUNTER — Encounter (HOSPITAL_BASED_OUTPATIENT_CLINIC_OR_DEPARTMENT_OTHER): Payer: Self-pay

## 2024-01-23 ENCOUNTER — Emergency Department (HOSPITAL_BASED_OUTPATIENT_CLINIC_OR_DEPARTMENT_OTHER)
Admission: EM | Admit: 2024-01-23 | Discharge: 2024-01-23 | Disposition: A | Discharge status: Home / Self Care | Attending: Emergency Medicine | Admitting: Emergency Medicine

## 2024-01-23 ENCOUNTER — Other Ambulatory Visit: Payer: Self-pay

## 2024-01-23 DIAGNOSIS — Z79899 Other long term (current) drug therapy: Secondary | ICD-10-CM | POA: Diagnosis not present

## 2024-01-23 DIAGNOSIS — R109 Unspecified abdominal pain: Secondary | ICD-10-CM | POA: Diagnosis not present

## 2024-01-23 DIAGNOSIS — K5730 Diverticulosis of large intestine without perforation or abscess without bleeding: Secondary | ICD-10-CM | POA: Diagnosis not present

## 2024-01-23 DIAGNOSIS — R1084 Generalized abdominal pain: Secondary | ICD-10-CM

## 2024-01-23 DIAGNOSIS — I10 Essential (primary) hypertension: Secondary | ICD-10-CM | POA: Diagnosis not present

## 2024-01-23 DIAGNOSIS — R1013 Epigastric pain: Secondary | ICD-10-CM | POA: Diagnosis not present

## 2024-01-23 DIAGNOSIS — R1011 Right upper quadrant pain: Secondary | ICD-10-CM | POA: Diagnosis not present

## 2024-01-23 DIAGNOSIS — Z9049 Acquired absence of other specified parts of digestive tract: Secondary | ICD-10-CM | POA: Diagnosis not present

## 2024-01-23 DIAGNOSIS — N3000 Acute cystitis without hematuria: Secondary | ICD-10-CM | POA: Diagnosis not present

## 2024-01-23 LAB — LACTIC ACID, PLASMA: Lactic Acid, Venous: 1 mmol/L (ref 0.5–1.9)

## 2024-01-23 LAB — CBC WITH DIFFERENTIAL/PLATELET
Abs Immature Granulocytes: 0.03 10*3/uL (ref 0.00–0.07)
Basophils Absolute: 0 10*3/uL (ref 0.0–0.1)
Basophils Relative: 0 %
Eosinophils Absolute: 0.1 10*3/uL (ref 0.0–0.5)
Eosinophils Relative: 1 %
HCT: 40.6 % (ref 36.0–46.0)
Hemoglobin: 13.4 g/dL (ref 12.0–15.0)
Immature Granulocytes: 0 %
Lymphocytes Relative: 21 %
Lymphs Abs: 1.9 10*3/uL (ref 0.7–4.0)
MCH: 29.8 pg (ref 26.0–34.0)
MCHC: 33 g/dL (ref 30.0–36.0)
MCV: 90.2 fL (ref 80.0–100.0)
Monocytes Absolute: 0.8 10*3/uL (ref 0.1–1.0)
Monocytes Relative: 9 %
Neutro Abs: 6.2 10*3/uL (ref 1.7–7.7)
Neutrophils Relative %: 69 %
Platelets: 179 10*3/uL (ref 150–400)
RBC: 4.5 MIL/uL (ref 3.87–5.11)
RDW: 13.5 % (ref 11.5–15.5)
WBC: 9 10*3/uL (ref 4.0–10.5)
nRBC: 0 % (ref 0.0–0.2)

## 2024-01-23 LAB — COMPREHENSIVE METABOLIC PANEL WITH GFR
ALT: 8 U/L (ref 0–44)
AST: 17 U/L (ref 15–41)
Albumin: 3.6 g/dL (ref 3.5–5.0)
Alkaline Phosphatase: 60 U/L (ref 38–126)
Anion gap: 10 (ref 5–15)
BUN: 10 mg/dL (ref 8–23)
CO2: 23 mmol/L (ref 22–32)
Calcium: 9.3 mg/dL (ref 8.9–10.3)
Chloride: 108 mmol/L (ref 98–111)
Creatinine, Ser: 0.73 mg/dL (ref 0.44–1.00)
GFR, Estimated: >60 mL/min (ref >60)
Glucose, Bld: 90 mg/dL (ref 70–99)
Potassium: 3.9 mmol/L (ref 3.5–5.1)
Sodium: 142 mmol/L (ref 135–145)
Total Bilirubin: 0.5 mg/dL (ref 0.0–1.2)
Total Protein: 6.7 g/dL (ref 6.5–8.1)

## 2024-01-23 LAB — URINALYSIS, ROUTINE W REFLEX MICROSCOPIC
Bacteria, UA: NONE SEEN
Bilirubin Urine: NEGATIVE
Glucose, UA: NEGATIVE mg/dL
Hgb urine dipstick: NEGATIVE
Ketones, ur: 15 mg/dL — AB
Nitrite: NEGATIVE
Protein, ur: NEGATIVE mg/dL
Specific Gravity, Urine: 1.039 — ABNORMAL HIGH (ref 1.005–1.030)
WBC, UA: >50 WBC/hpf (ref 0–5)
pH: 7 (ref 5.0–8.0)

## 2024-01-23 LAB — TROPONIN T, HIGH SENSITIVITY: Troponin T High Sensitivity: <15 ng/L (ref <19)

## 2024-01-23 LAB — LIPASE, BLOOD: Lipase: 25 U/L (ref 11–51)

## 2024-01-23 MED ORDER — DICYCLOMINE HCL 20 MG PO TABS: 20.0000 mg | ORAL_TABLET | Freq: Two times a day (BID) | ORAL | 0 refills | Status: DC

## 2024-01-23 MED ORDER — CEPHALEXIN 500 MG PO CAPS: 500.0000 mg | ORAL_CAPSULE | Freq: Two times a day (BID) | ORAL | 0 refills | Status: AC

## 2024-01-23 MED ORDER — SODIUM CHLORIDE 0.9 % IV BOLUS: 500.0000 mL | Freq: Once | INTRAVENOUS | Status: AC

## 2024-01-23 MED ORDER — IOHEXOL 300 MG/ML  SOLN
100.0000 mL | Freq: Once | INTRAMUSCULAR | Status: AC | PRN
  Administered 2024-01-23: 100 mL via INTRAVENOUS

## 2024-01-23 MED ORDER — POLYETHYLENE GLYCOL 3350 17 GM/SCOOP PO POWD: 17.0000 g | Freq: Every day | ORAL | 0 refills | Status: DC

## 2024-01-23 NOTE — ED Provider Notes (Signed)
 [place] EMERGENCY DEPARTMENT AT [facility] Provider Note   CSN: [id number] Arrival date & time: 01/23/24  4098     Patient presents with: No chief complaint on file.   Sandra Mclaughlin is a 89 y.o. female.   Patient with history of hypertension, osteoporosis, intermittent leukocytosis, MGUS followed by hematology --presents to the emergency department today for evaluation of abdominal pain.  Patient has a history of chronic abdominal symptoms.  Typically she will have some crampy abdominal pain with nausea that resolves.  It sounds like she had GI workup years ago.  Patient states that she was never diagnosed.  She takes Tylenol  at home and the pain hits.  Over the past 2 to 3 days patient describes her pain as excruciating.  It does have a colicky nature to it.  Currently the pain is better controlled.  She has had nausea without vomiting.  Stools have been on the loose side.  No blood in the stool.  No urinary symptoms.  Patient is drinking water.  Patient reports history of remote hernia repair, cholecystectomy, appendectomy.       Prior to Admission medications   Medication Sig Start Date End Date Taking? Authorizing Provider  acetaminophen  (TYLENOL ) 500 MG tablet Take 500 mg by mouth every 6 (six) hours as needed for moderate pain.    [provider]  Calcium  Carb-Cholecalciferol  (CALTRATE 600+D3 PO) Take 1 tablet by mouth 2 (two) times daily.    [provider]  Cholecalciferol  (VITAMIN D -3) 25 MCG (1000 UT) CAPS Take 1,000 Units by mouth daily.    [provider]  cyanocobalamin  (VITAMIN B12) 1000 MCG tablet Take 1,000 mcg by mouth daily.    [provider]  dorzolamide -timolol  (COSOPT ) 22.3-6.8 MG/ML ophthalmic solution Place 1 drop into both eyes 2 (two) times daily.    [provider]  gabapentin  (NEURONTIN ) 300 MG capsule Take 1 capsule (300 mg total) by mouth 2 (two) times daily. 06/12/21   Fargo, Amy E, NP   HYDROcodone -acetaminophen  (NORCO/VICODIN) 5-325 MG tablet Take 1 tablet by mouth every 6 (six) hours as needed for severe pain. 08/25/22   Eldon Greenland, MD  lisinopril  (ZESTRIL ) 20 MG tablet Take 20 mg by mouth daily. 03/21/22   [provider]  Multiple Vitamins-Minerals (CENTRUM SILVER PO) Take 1 tablet by mouth daily with breakfast.    [provider]  OVER THE COUNTER MEDICATION Take 1 capsule by mouth daily. Instaflex otc supplement    [provider]  vitamin C (ASCORBIC ACID) 500 MG tablet Take 500 mg by mouth daily.    [provider]    Allergies: Ciprofloxacin    Review of Systems  Updated Vital Signs BP (!) 160/93 (BP Location: Right Arm)   Pulse 75   Temp 98.1 F (36.7 C) (Oral)   Resp 20   SpO2 95%   Physical Exam Vitals and nursing note reviewed.  Constitutional:      General: She is not in acute distress.    Appearance: She is well-developed.  HENT:     Head: Normocephalic and atraumatic.     Right Ear: External ear normal.     Left Ear: External ear normal.     Nose: Nose normal.   Eyes:     Conjunctiva/sclera: Conjunctivae normal.    Cardiovascular:     Rate and Rhythm: Normal rate and regular rhythm.     Heart sounds: No murmur heard. Pulmonary:     Effort: No respiratory distress.  Breath sounds: No wheezing, rhonchi or rales.  Abdominal:     Palpations: Abdomen is soft.     Tenderness: There is abdominal tenderness in the right upper quadrant, epigastric area and left upper quadrant. There is no guarding or rebound. Negative signs include Murphy's sign, Rovsing's sign and McBurney's sign.     Comments: Overall nonfocal exam   Musculoskeletal:     Cervical back: Normal range of motion and neck supple.     Right lower leg: No edema.     Left lower leg: No edema.   Skin:    General: Skin is warm and dry.     Findings: No rash.   Neurological:     General: No focal deficit present.     Mental Status:  She is alert. Mental status is at baseline.     Motor: No weakness.   Psychiatric:        Mood and Affect: Mood normal.     (all labs ordered are listed, but only abnormal results are displayed) Labs Reviewed  URINALYSIS, ROUTINE W REFLEX MICROSCOPIC - Abnormal; Notable for the following components:      Result Value   Color, Urine COLORLESS (*)    Specific Gravity, Urine 1.039 (*)    Ketones, ur 15 (*)    Leukocytes,Ua MODERATE (*)    All other components within normal limits  CBC WITH DIFFERENTIAL/PLATELET  COMPREHENSIVE METABOLIC PANEL WITH GFR  LIPASE, BLOOD  LACTIC ACID, PLASMA  TROPONIN T, HIGH SENSITIVITY    EKG: None  Radiology: CT ABDOMEN PELVIS W CONTRAST Result Date: 01/23/2024 CLINICAL DATA:  Abdominal pain nonlocalized EXAM: CT ABDOMEN AND PELVIS WITHOUT CONTRAST TECHNIQUE: Multidetector CT imaging of the abdomen and pelvis was performed following the standard protocol without IV contrast. RADIATION DOSE REDUCTION: This exam was performed according to the departmental dose-optimization program which includes automated exposure control, adjustment of the mA and/or kV according to patient size and/or use of iterative reconstruction technique. COMPARISON:  CT chest August 25, 2022, CT abdomen April 21, 2022 FINDINGS: Lower chest: No infiltrates or consolidations, no pleural effusions Hepatobiliary: No focal liver abnormality is seen. Status post cholecystectomy. Comparison with prior examinations demonstrates no significant change in the intrahepatic biliary dilatation with normal-sized common bile duct. Pancreas: Pancreas normal size. No masses calcifications or inflammatory changes. Spleen: Spleen normal size.  No masses. Adrenals/Urinary Tract: Adrenal glands are normal size. Follow-up recommended. Kidneys are normal. No masses calcifications or hydronephrosis Stomach/Bowel: No small or large bowel obstruction or inflammatory changes. Moderate amount of residual fecal  material throughout the colon without obstruction or constipation. Diffuse diverticulosis descending colon sigmoid colon without diverticulitis Vascular/Lymphatic: No significant vascular findings are present. No enlarged abdominal or pelvic lymph nodes. Reproductive: .  No masses. Bladder unremarkable. Other: Anterior abdominal wall unremarkable without evidence of umbilical or inguinal hernias Musculoskeletal: Visualized portion of the thoracolumbar spine and pelvic structures grossly unremarkable without evidence of fracture bony abnormalities or soft tissue masses. IMPRESSION: *No acute findings in the abdomen or pelvis. *Status post cholecystectomy. *No significant change in the intrahepatic biliary dilatation with normal-sized common bile duct. *Moderate amount of residual fecal material throughout the colon without obstruction or constipation. *Diffuse diverticulosis descending colon sigmoid colon without diverticulitis. Electronically Signed   By: Fredrich Jefferson M.D.   On: 01/23/2024 11:37     Procedures   Medications Ordered in the ED - No data to display  ED Course  Patient seen and examined. History obtained directly from patient.  Family members also arrived at bedside and contribute to history.  Reviewed recent heme-onc note.  Labs/EKG: Ordered CBC, CMP, lipase, UA, lactate.  Will check screening EKG as well.  Imaging: Will need CT abdomen and pelvis, contrast depending upon kidney function.  Medications/Fluids: Ordered: IV fluid bolus 500 cc.   Most recent vital signs reviewed and are as follows: BP (!) 160/93 (BP Location: Right Arm)   Pulse 75   Temp 98.1 F (36.7 C) (Oral)   Resp 20   Ht 5' (1.524 m)   Wt 48.1 kg   SpO2 95%   BMI 20.70 kg/m   Initial impression: Acute on chronic abdominal pain, with nausea  1:30 PM Reassessment performed. Patient appears stable on several rechecks.  Labs personally reviewed and interpreted including: CBC unremarkable; CMP  unremarkable; lipase normal; troponin normal; lactate normal.  Pending UA >> prior to discharge this came back with signs of UTI, greater than 50 white cells per high-power field.  Imaging personally visualized and interpreted including: CT, agree no acute findings.  Reviewed pertinent lab work and imaging with patient and family at bedside. Questions answered.  We discussed trial of MiraLAX  with Bentyl as needed for more severe abdominal pain or spasms if these recur.  Discussed treatment of UTI and need for recheck.  Patient does report having some blood in her urine prior.  Discussed that blood today likely related to infection.  Most current vital signs reviewed and are as follows: BP (!) 155/73   Pulse (!) 59   Temp 98.1 F (36.7 C) (Oral)   Resp 19   Ht 5' (1.524 m)   Wt 48.1 kg   SpO2 97%   BMI 20.70 kg/m   Plan: Discharge to home.   Prescriptions written for: MiraLAX , Bentyl, Keflex  Other home care instructions discussed: Maintain good hydration  ED return instructions discussed: The patient was urged to return to the Emergency Department immediately with worsening of current symptoms, worsening abdominal pain, persistent vomiting, blood noted in stools, fever, or any other concerns. The patient verbalized understanding.   Follow-up instructions discussed: Patient encouraged to follow-up with their PCP in 3 days.                                   Medical Decision Making Amount and/or Complexity of Data Reviewed Labs: ordered. Radiology: ordered.  Risk Prescription drug management.   For this patient's complaint of abdominal pain, the following conditions were considered on the differential diagnosis: gastritis/PUD, enteritis/duodenitis, appendicitis, cholelithiasis/cholecystitis, cholangitis, pancreatitis, ruptured viscus, colitis, diverticulitis, small/large bowel obstruction, proctitis, cystitis, pyelonephritis, ureteral colic, aortic dissection, aortic aneurysm. In  women, pelvic inflammatory disease, ovarian cysts, and tubo-ovarian abscess were also considered. Atypical chest etiologies were also considered including ACS, PE, and pneumonia.  Patient's labs were all normal with exception of UA which suggests UTI.  CT scan was reassuring, patient does have some hints of constipation.  This may be contributing.  Treatment plan as above.  Otherwise vital signs are normal.  Patient with stable exam and actually the pain has been well-controlled here.  Family comfortable with monitoring.  The patient's vital signs, pertinent lab work and imaging were reviewed and interpreted as discussed in the ED course. Hospitalization was considered for further testing, treatments, or serial exams/observation. However as patient is well-appearing, has a stable exam, and reassuring studies today, I do not feel that they warrant admission at this time.  This plan was discussed with the patient who verbalizes agreement and comfort with this plan and seems reliable and able to return to the Emergency Department with worsening or changing symptoms.         Final diagnoses:  Generalized abdominal pain  Acute cystitis without hematuria    ED Discharge Orders          Ordered    cephALEXin (KEFLEX) 500 MG capsule  2 times daily        01/23/24 1322    polyethylene glycol powder (GLYCOLAX /MIRALAX ) 17 GM/SCOOP powder  Daily        01/23/24 1322    dicyclomine (BENTYL) 20 MG tablet  2 times daily        01/23/24 1322               Lyna Sandhoff, PA-C 01/23/24 1333    Scarlette Currier, MD 01/24/24 1810

## 2024-01-23 NOTE — Discharge Instructions (Addendum)
 Please read and follow all provided instructions.  Your diagnoses today include:  1. Generalized abdominal pain   2. Acute cystitis without hematuria     Tests performed today include: Complete blood cell count: Were normal Complete metabolic panel: Was normal Lipase (pancreas function test): Was normal Urinalysis (urine test): Shows signs of urine infection Blood test for the heart and blood test for severe infection were normal CT scan does not show any concerning problems or obvious explanation of your pain, you do have moderate stool throughout the colon which could indicate a degree of constipation Vital signs. See below for your results today.   Medications prescribed:  Keflex (cephalexin) - antibiotic  You have been prescribed an antibiotic medicine: take the entire course of medicine even if you are feeling better. Stopping early can cause the antibiotic not to work.  Bentyl - medication for intestinal cramps and spasms  Miralax  - laxative  This medication can be found over-the-counter.   Take any prescribed medications only as directed.  Home care instructions:  Follow any educational materials contained in this packet.  Follow-up instructions: Please follow-up with your primary care provider in the next 3 days for further evaluation of your symptoms.    Return instructions:  SEEK IMMEDIATE MEDICAL ATTENTION IF: The pain does not go away or becomes severe  A temperature above 101F develops  Repeated vomiting occurs (multiple episodes)  The pain becomes localized to portions of the abdomen. The right side could possibly be appendicitis. In an adult, the left lower portion of the abdomen could be colitis or diverticulitis.  Blood is being passed in stools or vomit (bright red or black tarry stools)  You develop chest pain, difficulty breathing, dizziness or fainting, or become confused, poorly responsive, or inconsolable (young children) If you have any other  emergent concerns regarding your health  Additional Information: Abdominal (belly) pain can be caused by many things. Your caregiver performed an examination and possibly ordered blood/urine tests and imaging (CT scan, x-rays, ultrasound). Many cases can be observed and treated at home after initial evaluation in the emergency department. Even though you are being discharged home, abdominal pain can be unpredictable. Therefore, you need a repeated exam if your pain does not resolve, returns, or worsens. Most patients with abdominal pain don't have to be admitted to the hospital or have surgery, but serious problems like appendicitis and gallbladder attacks can start out as nonspecific pain. Many abdominal conditions cannot be diagnosed in one visit, so follow-up evaluations are very important.  Your vital signs today were: BP (!) 155/73   Pulse (!) 59   Temp 98.1 F (36.7 C) (Oral)   Resp 19   Ht 5' (1.524 m)   Wt 48.1 kg   SpO2 97%   BMI 20.70 kg/m  If your blood pressure (bp) was elevated above 135/85 this visit, please have this repeated by your doctor within one month. --------------

## 2024-01-23 NOTE — ED Notes (Signed)
 Patient transported to CT

## 2024-01-23 NOTE — ED Triage Notes (Signed)
 In for eval of abd pain with nausea and loose stools. Denies vomiting. Last BM last night.

## 2024-01-23 NOTE — ED Notes (Signed)
 PO fluids given

## 2024-01-23 NOTE — ED Notes (Signed)
 Assisted to restroom.

## 2024-01-23 NOTE — ED Notes (Signed)
 Reviewed AVS/discharge instruction with patient. Time allotted for and all questions answered. Patient is agreeable for d/c and escorted to ed exit by staff.

## 2024-02-01 DIAGNOSIS — R8290 Unspecified abnormal findings in urine: Secondary | ICD-10-CM | POA: Diagnosis not present

## 2024-02-09 DIAGNOSIS — R1013 Epigastric pain: Secondary | ICD-10-CM | POA: Diagnosis not present

## 2024-02-20 DIAGNOSIS — K080 Exfoliation of teeth due to systemic causes: Secondary | ICD-10-CM | POA: Diagnosis not present

## 2024-03-01 DIAGNOSIS — R159 Full incontinence of feces: Secondary | ICD-10-CM | POA: Diagnosis not present

## 2024-03-01 DIAGNOSIS — N3020 Other chronic cystitis without hematuria: Secondary | ICD-10-CM | POA: Diagnosis not present

## 2024-03-01 DIAGNOSIS — N958 Other specified menopausal and perimenopausal disorders: Secondary | ICD-10-CM | POA: Diagnosis not present

## 2024-03-01 DIAGNOSIS — K469 Unspecified abdominal hernia without obstruction or gangrene: Secondary | ICD-10-CM | POA: Diagnosis not present

## 2024-03-02 DIAGNOSIS — M170 Bilateral primary osteoarthritis of knee: Secondary | ICD-10-CM | POA: Diagnosis not present

## 2024-03-12 DIAGNOSIS — I10 Essential (primary) hypertension: Secondary | ICD-10-CM | POA: Diagnosis not present

## 2024-03-12 DIAGNOSIS — R8290 Unspecified abnormal findings in urine: Secondary | ICD-10-CM | POA: Diagnosis not present

## 2024-03-12 DIAGNOSIS — D72829 Elevated white blood cell count, unspecified: Secondary | ICD-10-CM | POA: Diagnosis not present

## 2024-03-14 DIAGNOSIS — G629 Polyneuropathy, unspecified: Secondary | ICD-10-CM | POA: Diagnosis not present

## 2024-03-14 DIAGNOSIS — K219 Gastro-esophageal reflux disease without esophagitis: Secondary | ICD-10-CM | POA: Diagnosis not present

## 2024-03-14 DIAGNOSIS — M810 Age-related osteoporosis without current pathological fracture: Secondary | ICD-10-CM | POA: Diagnosis not present

## 2024-03-14 DIAGNOSIS — Z0000 Encounter for general adult medical examination without abnormal findings: Secondary | ICD-10-CM | POA: Diagnosis not present

## 2024-03-14 DIAGNOSIS — I10 Essential (primary) hypertension: Secondary | ICD-10-CM | POA: Diagnosis not present

## 2024-03-21 DIAGNOSIS — R3129 Other microscopic hematuria: Secondary | ICD-10-CM | POA: Diagnosis not present

## 2024-04-05 DIAGNOSIS — N3020 Other chronic cystitis without hematuria: Secondary | ICD-10-CM | POA: Diagnosis not present

## 2024-04-11 DIAGNOSIS — M810 Age-related osteoporosis without current pathological fracture: Secondary | ICD-10-CM | POA: Diagnosis not present

## 2024-04-11 DIAGNOSIS — Z1231 Encounter for screening mammogram for malignant neoplasm of breast: Secondary | ICD-10-CM | POA: Diagnosis not present

## 2024-05-14 ENCOUNTER — Inpatient Hospital Stay: Payer: Medicare Other | Attending: Hematology and Oncology

## 2024-05-14 DIAGNOSIS — D472 Monoclonal gammopathy: Secondary | ICD-10-CM | POA: Diagnosis not present

## 2024-05-14 LAB — CMP (CANCER CENTER ONLY)
ALT: 14 U/L (ref 0–44)
AST: 15 U/L (ref 15–41)
Albumin: 3.7 g/dL (ref 3.5–5.0)
Alkaline Phosphatase: 60 U/L (ref 38–126)
Anion gap: 3 — ABNORMAL LOW (ref 5–15)
BUN: 14 mg/dL (ref 8–23)
CO2: 32 mmol/L (ref 22–32)
Calcium: 9.7 mg/dL (ref 8.9–10.3)
Chloride: 107 mmol/L (ref 98–111)
Creatinine: 0.72 mg/dL (ref 0.44–1.00)
GFR, Estimated: >60 mL/min (ref >60)
Glucose, Bld: 88 mg/dL (ref 70–99)
Potassium: 3.9 mmol/L (ref 3.5–5.1)
Sodium: 142 mmol/L (ref 135–145)
Total Bilirubin: 0.4 mg/dL (ref 0.0–1.2)
Total Protein: 6.9 g/dL (ref 6.5–8.1)

## 2024-05-14 LAB — CBC WITH DIFFERENTIAL (CANCER CENTER ONLY)
Abs Immature Granulocytes: 0.02 K/uL (ref 0.00–0.07)
Basophils Absolute: 0.1 K/uL (ref 0.0–0.1)
Basophils Relative: 1 %
Eosinophils Absolute: 0.2 K/uL (ref 0.0–0.5)
Eosinophils Relative: 2 %
HCT: 41 % (ref 36.0–46.0)
Hemoglobin: 13.2 g/dL (ref 12.0–15.0)
Immature Granulocytes: 0 %
Lymphocytes Relative: 24 %
Lymphs Abs: 2.1 K/uL (ref 0.7–4.0)
MCH: 29.5 pg (ref 26.0–34.0)
MCHC: 32.2 g/dL (ref 30.0–36.0)
MCV: 91.5 fL (ref 80.0–100.0)
Monocytes Absolute: 0.6 K/uL (ref 0.1–1.0)
Monocytes Relative: 7 %
Neutro Abs: 5.8 K/uL (ref 1.7–7.7)
Neutrophils Relative %: 66 %
Platelet Count: 225 K/uL (ref 150–400)
RBC: 4.48 MIL/uL (ref 3.87–5.11)
RDW: 13.3 % (ref 11.5–15.5)
WBC Count: 8.8 K/uL (ref 4.0–10.5)
nRBC: 0 % (ref 0.0–0.2)

## 2024-05-15 DIAGNOSIS — N390 Urinary tract infection, site not specified: Secondary | ICD-10-CM | POA: Diagnosis not present

## 2024-05-15 LAB — KAPPA/LAMBDA LIGHT CHAINS
Kappa free light chain: 30.9 mg/L — ABNORMAL HIGH (ref 3.3–19.4)
Kappa, lambda light chain ratio: 0.76 (ref 0.26–1.65)
Lambda free light chains: 40.9 mg/L — ABNORMAL HIGH (ref 5.7–26.3)

## 2024-05-17 LAB — MULTIPLE MYELOMA PANEL, SERUM
Albumin SerPl Elph-Mcnc: 3 g/dL (ref 2.9–4.4)
Albumin/Glob SerPl: 1 (ref 0.7–1.7)
Alpha 1: 0.3 g/dL (ref 0.0–0.4)
Alpha2 Glob SerPl Elph-Mcnc: 0.7 g/dL (ref 0.4–1.0)
B-Globulin SerPl Elph-Mcnc: 1 g/dL (ref 0.7–1.3)
Gamma Glob SerPl Elph-Mcnc: 1.2 g/dL (ref 0.4–1.8)
Globulin, Total: 3.2 g/dL (ref 2.2–3.9)
IgA: 490 mg/dL — ABNORMAL HIGH (ref 64–422)
IgG (Immunoglobin G), Serum: 1086 mg/dL (ref 586–1602)
IgM (Immunoglobulin M), Srm: 177 mg/dL (ref 26–217)
M Protein SerPl Elph-Mcnc: 0.6 g/dL — ABNORMAL HIGH
Total Protein ELP: 6.2 g/dL (ref 6.0–8.5)

## 2024-05-25 ENCOUNTER — Inpatient Hospital Stay: Payer: Medicare Other | Admitting: Hematology and Oncology

## 2024-05-25 ENCOUNTER — Encounter: Payer: Self-pay | Admitting: Hematology and Oncology

## 2024-05-25 VITALS — BP 138/79 | HR 69 | Temp 98.5°F | Resp 17 | Ht 60.0 in | Wt 103.8 lb

## 2024-05-25 DIAGNOSIS — D472 Monoclonal gammopathy: Secondary | ICD-10-CM | POA: Diagnosis not present

## 2024-05-25 NOTE — Assessment & Plan Note (Addendum)
 The patient was diagnosed with IgG MGUS in 2024 and is on observation  No signs of bone lesions, hypercalcemia or renal dysfunction or anemia I recommend surveillance once a year

## 2024-05-25 NOTE — Progress Notes (Signed)
 [facility] OFFICE PROGRESS NOTE  Patient Care Team: Leonel Cole, MD as PCP - General (Family Medicine)  ASSESSMENT & PLAN:  Assessment & Plan MGUS (monoclonal gammopathy of unknown significance) The patient was diagnosed with IgG MGUS in 2024 and is on observation  No signs of bone lesions, hypercalcemia or renal dysfunction or anemia I recommend surveillance once a year  Orders Placed This Encounter  Procedures   CMP (Cancer Center only)    Standing Status:   Future    Expiration Date:   05/25/2025   CBC with Differential (Cancer Center Only)    Standing Status:   Future    Expiration Date:   05/25/2025   Kappa/lambda light chains    Standing Status:   Standing    Number of Occurrences:   22    Expiration Date:   05/25/2025   Multiple Myeloma Panel (SPEP&IFE w/QIG)    Standing Status:   Standing    Number of Occurrences:   22    Expiration Date:   05/25/2025     INTERVAL HISTORY: she returns for surveillance follow-up for diagnosis of MGUS Patient denies recurrent infection or bone pain We reviewed recent CBC, CMP and myeloma panel results  PHYSICAL EXAMINATION: ECOG PERFORMANCE STATUS: 0 - Asymptomatic  Vitals:   05/25/24 0901  BP: 138/79  Pulse: 69  Resp: 17  Temp: 98.5 F (36.9 C)  SpO2: 96%

## 2024-07-04 DIAGNOSIS — L905 Scar conditions and fibrosis of skin: Secondary | ICD-10-CM | POA: Diagnosis not present

## 2024-07-04 DIAGNOSIS — D225 Melanocytic nevi of trunk: Secondary | ICD-10-CM | POA: Diagnosis not present

## 2024-07-04 DIAGNOSIS — L821 Other seborrheic keratosis: Secondary | ICD-10-CM | POA: Diagnosis not present

## 2024-07-04 DIAGNOSIS — X32XXXD Exposure to sunlight, subsequent encounter: Secondary | ICD-10-CM | POA: Diagnosis not present

## 2024-07-04 DIAGNOSIS — L570 Actinic keratosis: Secondary | ICD-10-CM | POA: Diagnosis not present

## 2024-07-08 ENCOUNTER — Encounter (HOSPITAL_COMMUNITY): Payer: Self-pay | Admitting: Emergency Medicine

## 2024-07-08 ENCOUNTER — Emergency Department (HOSPITAL_COMMUNITY)
Admission: EM | Admit: 2024-07-08 | Discharge: 2024-07-08 | Disposition: A | Discharge status: Home / Self Care | Attending: Emergency Medicine | Admitting: Emergency Medicine

## 2024-07-08 DIAGNOSIS — R739 Hyperglycemia, unspecified: Secondary | ICD-10-CM | POA: Insufficient documentation

## 2024-07-08 DIAGNOSIS — N3001 Acute cystitis with hematuria: Secondary | ICD-10-CM | POA: Diagnosis not present

## 2024-07-08 DIAGNOSIS — R310 Gross hematuria: Secondary | ICD-10-CM

## 2024-07-08 DIAGNOSIS — R319 Hematuria, unspecified: Secondary | ICD-10-CM | POA: Insufficient documentation

## 2024-07-08 LAB — CBC WITH DIFFERENTIAL/PLATELET
Abs Immature Granulocytes: 0.03 K/uL (ref 0.00–0.07)
Basophils Absolute: 0 K/uL (ref 0.0–0.1)
Basophils Relative: 0 %
Eosinophils Absolute: 0.1 K/uL (ref 0.0–0.5)
Eosinophils Relative: 1 %
HCT: 40.9 % (ref 36.0–46.0)
Hemoglobin: 12.9 g/dL (ref 12.0–15.0)
Immature Granulocytes: 0 %
Lymphocytes Relative: 18 %
Lymphs Abs: 1.8 K/uL (ref 0.7–4.0)
MCH: 29.1 pg (ref 26.0–34.0)
MCHC: 31.5 g/dL (ref 30.0–36.0)
MCV: 92.3 fL (ref 80.0–100.0)
Monocytes Absolute: 0.8 K/uL (ref 0.1–1.0)
Monocytes Relative: 8 %
Neutro Abs: 7.3 K/uL (ref 1.7–7.7)
Neutrophils Relative %: 73 %
Platelets: 194 K/uL (ref 150–400)
RBC: 4.43 MIL/uL (ref 3.87–5.11)
RDW: 12.6 % (ref 11.5–15.5)
WBC: 10 K/uL (ref 4.0–10.5)
nRBC: 0 % (ref 0.0–0.2)

## 2024-07-08 LAB — BASIC METABOLIC PANEL WITH GFR
Anion gap: 8 (ref 5–15)
BUN: 8 mg/dL (ref 8–23)
CO2: 25 mmol/L (ref 22–32)
Calcium: 8.9 mg/dL (ref 8.9–10.3)
Chloride: 102 mmol/L (ref 98–111)
Creatinine, Ser: 0.54 mg/dL (ref 0.44–1.00)
GFR, Estimated: >60 mL/min (ref >60)
Glucose, Bld: 128 mg/dL — ABNORMAL HIGH (ref 70–99)
Potassium: 4.5 mmol/L (ref 3.5–5.1)
Sodium: 135 mmol/L (ref 135–145)

## 2024-07-08 LAB — URINALYSIS, ROUTINE W REFLEX MICROSCOPIC
Bilirubin Urine: NEGATIVE
Glucose, UA: NEGATIVE mg/dL
Ketones, ur: NEGATIVE mg/dL
Nitrite: NEGATIVE
Protein, ur: 30 mg/dL — AB
Specific Gravity, Urine: <1.005 — ABNORMAL LOW (ref 1.005–1.030)
pH: 6.5 (ref 5.0–8.0)

## 2024-07-08 LAB — URINALYSIS, MICROSCOPIC (REFLEX): RBC / HPF: >50 RBC/hpf (ref 0–5)

## 2024-07-08 MED ORDER — CEPHALEXIN 500 MG PO CAPS: 500.0000 mg | ORAL_CAPSULE | Freq: Two times a day (BID) | ORAL | 0 refills | Status: DC

## 2024-07-08 MED ORDER — CEPHALEXIN 250 MG PO CAPS
500.0000 mg | ORAL_CAPSULE | Freq: Once | ORAL | Status: AC
  Administered 2024-07-08: 500 mg via ORAL
  Filled 2024-07-08: qty 2

## 2024-07-08 MED ORDER — CEPHALEXIN 500 MG PO CAPS: 500.0000 mg | ORAL_CAPSULE | Freq: Two times a day (BID) | ORAL | 0 refills | Status: AC

## 2024-07-08 NOTE — ED Provider Notes (Signed)
 [place] EMERGENCY DEPARTMENT AT [facility] Provider Note   CSN: [id number] Arrival date & time: 07/08/24  9451     Patient presents with: Hematuria   Sandra Mclaughlin is a 89 y.o. female.  Patient with past medical history significant for vaginal prolapse, arthritis, fecal incontinence presents to the emergency department complaining of hematuria.  She endorses history of UTIs but states she has never had hematuria with one before.  She felt some pain in her lower abdomen which is now resolved.  When she went to the bathroom this morning she noticed blood in it 2 times.  She has no other complaints at this time.    Hematuria       Prior to Admission medications   Medication Sig Start Date End Date Taking? Authorizing Provider  acetaminophen  (TYLENOL ) 500 MG tablet Take 500 mg by mouth every 6 (six) hours as needed for moderate pain.    [provider]  Calcium  Carb-Cholecalciferol  (CALTRATE 600+D3 PO) Take 1 tablet by mouth 2 (two) times daily.    [provider]  Cholecalciferol  (VITAMIN D -3) 25 MCG (1000 UT) CAPS Take 1,000 Units by mouth daily.    [provider]  cyanocobalamin  (VITAMIN B12) 1000 MCG tablet Take 1,000 mcg by mouth daily.    [provider]  dorzolamide -timolol  (COSOPT ) 22.3-6.8 MG/ML ophthalmic solution Place 1 drop into both eyes 2 (two) times daily.    [provider]  gabapentin  (NEURONTIN ) 300 MG capsule Take 1 capsule (300 mg total) by mouth 2 (two) times daily. 06/12/21   Fargo, Amy E, NP  lisinopril  (ZESTRIL ) 20 MG tablet Take 20 mg by mouth daily. 03/21/22   [provider]  Multiple Vitamins-Minerals (CENTRUM SILVER PO) Take 1 tablet by mouth daily with breakfast.    [provider]  OVER THE COUNTER MEDICATION Take 1 capsule by mouth daily. Instaflex otc supplement    [provider]  vitamin C (ASCORBIC ACID) 500 MG tablet Take 500 mg by mouth daily.    [provider]    Allergies: Ciprofloxacin    Review of Systems  Genitourinary:  Positive for hematuria.    Updated Vital Signs BP (!) 164/74 (BP Location: Right Arm)   Pulse 80   Temp 98.1 F (36.7 C) (Oral)   Resp 20   SpO2 95%   Physical Exam Vitals and nursing note reviewed.  Constitutional:      General: She is not in acute distress.    Appearance: She is well-developed.  HENT:     Head: Normocephalic and atraumatic.  Eyes:     Conjunctiva/sclera: Conjunctivae normal.  Cardiovascular:     Rate and Rhythm: Normal rate and regular rhythm.     Heart sounds: No murmur heard. Pulmonary:     Effort: Pulmonary effort is normal. No respiratory distress.     Breath sounds: Normal breath sounds.  Abdominal:     Palpations: Abdomen is soft.     Tenderness: There is no abdominal tenderness.  Musculoskeletal:        General: No swelling.     Cervical back: Neck supple.  Skin:    General: Skin is warm and dry.     Capillary Refill: Capillary refill takes less than 2 seconds.  Neurological:     Mental Status: She is alert.  Psychiatric:        Mood and Affect: Mood normal.     (all labs ordered are listed, but only abnormal results are  displayed) Labs Reviewed  URINALYSIS, ROUTINE W REFLEX MICROSCOPIC  BASIC METABOLIC PANEL WITH GFR  CBC WITH DIFFERENTIAL/PLATELET    EKG: None  Radiology: No results found.   Procedures   Medications Ordered in the ED - No data to display                                  Medical Decision Making Amount and/or Complexity of Data Reviewed Labs: ordered.   This patient presents to the ED for concern of hematuria, this involves an extensive number of treatment options, and is a complaint that carries with it a high risk of complications and morbidity.  The differential diagnosis includes UTI, pyelonephritis, malignancy, others   Co morbidities / Chronic conditions that complicate the patient evaluation  As noted in  HPI   Additional history obtained:  Additional history obtained from EMR External records from outside source obtained and reviewed including outpatient notes from August of this year, patient had a CT urogram due to microscopic hematuria with no significant findings   Lab Tests:  I Ordered, and personally interpreted labs.  The pertinent results include: UA, CBC, BMP, and urine culture pending   Imaging Studies ordered:  Patient has no abdominal tenderness on examination.  No indication for emergent abdominal imaging.  No signs of surgical/acute abdomen  Test / Admission - Considered:  Labs pending at this time.  Patient has had frequent UTIs with microscopic hematuria.  She denies history of frank hematuria.  She had a reassuring CT urogram in August of this year.  Suspect possible infectious cause of bleeding as patient is not on blood thinners.  Patient care being signed out to [facility], PA-C at shift handoff.  Patient may need antibiotics and follow up with urology.       Final diagnoses:  None    ED Discharge Orders     None          Logan Ubaldo KATHEE DEVONNA 07/08/24 9372    Jerrol Agent, MD 07/08/24 [phone]

## 2024-07-08 NOTE — ED Provider Notes (Signed)
 Accepted handoff at shift change from [facility]. Please see prior provider note for more detail.   Briefly: Patient is 89 y.o. presents to the emergency department complaining of hematuria. She endorses history of UTIs but states she has never had hematuria with one before. She felt some pain in her lower abdomen which is now resolved. When she went to the bathroom this morning she noticed blood in it 2 times. She has no other complaints at this time.  DDX: concern for UTI, pyelonephritis, malignancy, others   Plan: - dispo pending lab workup - BMP with mild hyperglycemia at 128.  CBC without leukocytosis or anemia.  UA concerning for infection with many bacteria, 6-10 WBC, small leukocytes, large hemoglobin.  Urine culture pending. - Shared all results with patient.  Answered all questions.  Patient continues to be without abdominal pain, nausea, vomiting.  Patient states she overall feels fine she was just worried due to the gross hematuria.  Will start patient on Keflex  and have her follow-up with her PCP and urologist.  Patient agreeable with plan and is ready go home. - Patient afebrile with stable vitals.  Provided with return precautions.  Discharged in good condition.    Hoy Nidia FALCON, [place] 07/08/24 9340    Jerrol Agent, MD 07/08/24 [phone]

## 2024-07-08 NOTE — Discharge Instructions (Addendum)
 As discussed, please follow-up with your primary care provider and urologist.  Seek emergency care if experiencing any new or worsening symptoms.

## 2024-07-08 NOTE — ED Triage Notes (Signed)
 PT from [place]. Hx of UTIs with lower ABD pain. Urinated this morning and noticed blood in it x 2. Alert and oriented and walks with quad cane.

## 2024-07-09 LAB — URINE CULTURE: Culture: NO GROWTH

## 2024-07-11 DIAGNOSIS — H401132 Primary open-angle glaucoma, bilateral, moderate stage: Secondary | ICD-10-CM | POA: Diagnosis not present

## 2024-07-11 DIAGNOSIS — Z961 Presence of intraocular lens: Secondary | ICD-10-CM | POA: Diagnosis not present

## 2024-07-19 ENCOUNTER — Telehealth: Payer: Self-pay

## 2024-07-19 NOTE — Telephone Encounter (Signed)
 Received call from patient requesting information on her My Chart. Encouraged her to contact [phone] for further information on accessing information.   Sandra Batty, MSN, RN Case Management [phone]

## 2024-07-23 DIAGNOSIS — N3021 Other chronic cystitis with hematuria: Secondary | ICD-10-CM | POA: Diagnosis not present

## 2025-05-15 ENCOUNTER — Inpatient Hospital Stay

## 2025-05-27 ENCOUNTER — Inpatient Hospital Stay: Admitting: Hematology and Oncology
